# Patient Record
Sex: Female | Born: 1960 | Race: White | Hispanic: No | Marital: Married | Smoking: Never smoker
Health system: Southern US, Community
[De-identification: ages and names within clinical notes are randomized; demographics above are authoritative.]

## PROBLEM LIST (undated history)

## (undated) DIAGNOSIS — J45909 Unspecified asthma, uncomplicated: Secondary | ICD-10-CM

## (undated) DIAGNOSIS — I10 Essential (primary) hypertension: Secondary | ICD-10-CM

## (undated) DIAGNOSIS — R7303 Prediabetes: Secondary | ICD-10-CM

## (undated) DIAGNOSIS — Z974 Presence of external hearing-aid: Secondary | ICD-10-CM

## (undated) DIAGNOSIS — G4730 Sleep apnea, unspecified: Secondary | ICD-10-CM

## (undated) DIAGNOSIS — R569 Unspecified convulsions: Secondary | ICD-10-CM

## (undated) DIAGNOSIS — T7840XA Allergy, unspecified, initial encounter: Secondary | ICD-10-CM

## (undated) DIAGNOSIS — E039 Hypothyroidism, unspecified: Secondary | ICD-10-CM

## (undated) DIAGNOSIS — K219 Gastro-esophageal reflux disease without esophagitis: Secondary | ICD-10-CM

## (undated) DIAGNOSIS — F32A Depression, unspecified: Secondary | ICD-10-CM

## (undated) DIAGNOSIS — M1990 Unspecified osteoarthritis, unspecified site: Secondary | ICD-10-CM

## (undated) DIAGNOSIS — D6851 Activated protein C resistance: Secondary | ICD-10-CM

## (undated) HISTORY — PX: SYNOVIAL CYST EXCISION: SUR507

## (undated) HISTORY — DX: Unspecified asthma, uncomplicated: J45.909

## (undated) HISTORY — DX: Allergy, unspecified, initial encounter: T78.40XA

## (undated) HISTORY — DX: Sleep apnea, unspecified: G47.30

## (undated) HISTORY — PX: TUBAL LIGATION: SHX77

## (undated) HISTORY — PX: TOTAL ABDOMINAL HYSTERECTOMY: SHX209

## (undated) HISTORY — DX: Hypothyroidism, unspecified: E03.9

## (undated) HISTORY — PX: RECTOCELE REPAIR: SHX761

## (undated) HISTORY — PX: SYNDACTLYLY REPAIR: SHX445

## (undated) HISTORY — PX: CYSTOCELE REPAIR: SHX163

## (undated) HISTORY — PX: CHOLECYSTECTOMY: SHX55

## (undated) HISTORY — DX: Essential (primary) hypertension: I10

## (undated) HISTORY — DX: Unspecified convulsions: R56.9

## (undated) HISTORY — PX: JOINT REPLACEMENT: SHX530

## (undated) HISTORY — PX: ABDOMINAL HYSTERECTOMY: SHX81

## (undated) HISTORY — PX: TENDON REPAIR: SHX5111

## (undated) HISTORY — DX: Unspecified osteoarthritis, unspecified site: M19.90

## (undated) HISTORY — PX: SALPINGECTOMY: SHX328

## (undated) HISTORY — PX: ANKLE RECONSTRUCTION: SHX1151

## (undated) HISTORY — DX: Presence of external hearing-aid: Z97.4

---

## 2015-01-07 ENCOUNTER — Other Ambulatory Visit: Payer: Self-pay | Admitting: Orthopedic Surgery

## 2015-01-07 DIAGNOSIS — M25552 Pain in left hip: Secondary | ICD-10-CM

## 2015-01-08 ENCOUNTER — Encounter: Payer: Self-pay | Admitting: Orthopedic Surgery

## 2015-01-08 ENCOUNTER — Ambulatory Visit: Payer: Self-pay | Admitting: Orthopedic Surgery

## 2015-01-08 ENCOUNTER — Other Ambulatory Visit: Payer: Self-pay | Admitting: Orthopedic Surgery

## 2015-01-08 VITALS — BP 198/95 | HR 68 | Ht 66.0 in | Wt 280.0 lb

## 2015-01-08 DIAGNOSIS — M25552 Pain in left hip: Secondary | ICD-10-CM
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
## 2015-01-08 NOTE — Progress Notes (Signed)
Patient seen at [facility], a dictated note will follow...

## 2018-12-09 HISTORY — PX: ESOPHAGOGASTRODUODENOSCOPY: SHX1529

## 2018-12-09 HISTORY — PX: COLONOSCOPY: SHX174

## 2020-02-15 ENCOUNTER — Ambulatory Visit: Payer: Self-pay

## 2020-02-15 ENCOUNTER — Other Ambulatory Visit: Payer: Self-pay

## 2020-02-15 ENCOUNTER — Ambulatory Visit (INDEPENDENT_AMBULATORY_CARE_PROVIDER_SITE_OTHER): Payer: Managed Care, Other (non HMO) | Admitting: Orthopaedic Surgery

## 2020-02-15 ENCOUNTER — Ambulatory Visit (INDEPENDENT_AMBULATORY_CARE_PROVIDER_SITE_OTHER): Payer: Managed Care, Other (non HMO)

## 2020-02-15 ENCOUNTER — Encounter: Payer: Self-pay | Admitting: Orthopaedic Surgery

## 2020-02-15 VITALS — Ht 66.0 in | Wt 280.0 lb

## 2020-02-15 DIAGNOSIS — M25552 Pain in left hip: Secondary | ICD-10-CM | POA: Diagnosis not present

## 2020-02-15 MED ORDER — MELOXICAM 7.5 MG PO TABS: 7.5000 mg | ORAL_TABLET | Freq: Two times a day (BID) | ORAL | 2 refills | Status: DC | PRN

## 2020-02-15 NOTE — Progress Notes (Signed)
Office Visit Note   Patient: Christy Todd           Date of Birth: 07-13-1961           MRN: [id number] Visit Date: 02/15/2020              Requested by: No referring provider defined for this encounter. PCP: Patient, No Pcp Per   Assessment & Plan: Visit Diagnoses:  1. Pain in left hip     Plan: Impression is left hip moderate DJD.  Based on the x-ray findings we discussed that her symptoms are more consistent with DJD than an isolated labral tear.  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  Based on our discussion of treatment options she has agreed to try another left hip injection.  We also discussed about her increased BMI of 45 which would need to be corrected prior to consideration of hip replacement surgery.  I did send in a prescription for meloxicam to see if this will work better than Advil.  I will see her back as needed.  Follow-Up Instructions: Return if symptoms worsen or fail to improve.   Orders:  Orders Placed This Encounter  Procedures  . XR HIP UNILAT W OR W/O PELVIS 2-3 VIEWS LEFT  . US Guided Needle Placement - No Linked Charges   Meds ordered this encounter  Medications  . meloxicam (MOBIC) 7.5 MG tablet    Sig: Take 1 tablet (7.5 mg total) by mouth 2 (two) times daily as needed for pain.    Dispense:  30 tablet    Refill:  2      Procedures: No procedures performed   Clinical Data: No additional findings.   Subjective: Chief Complaint  Patient presents with  . Left Hip - Pain    Christy Todd is a 59 year old female who has moved down here from [place].  Her daughter is a physical therapist at [employer] in [place].  She has had a chronic left hip pain for several years.  This is severely limiting from a functional standpoint and from a pain standpoint.  She denies any radicular symptoms.  She takes Cymbalta and nortriptyline to help with pain management.  She has had 2 prior left hip injections about 3 years ago that did not work  significantly well.  The pain affects her walking and causes right knee and lower back pain.  She was told in [place] at some point that she had a torn hip labrum.   Review of Systems  Constitutional: Negative.   HENT: Negative.   Eyes: Negative.   Respiratory: Negative.   Cardiovascular: Negative.   Endocrine: Negative.   Musculoskeletal: Negative.   Neurological: Negative.   Hematological: Negative.   Psychiatric/Behavioral: Negative.   All other systems reviewed and are negative.    Objective: Vital Signs: Ht 5\' 6"  (1.676 m)   Wt 280 lb (127 kg)   BMI 45.19 kg/m   Physical Exam Vitals and nursing note reviewed.  Constitutional:      Appearance: She is well-developed.  HENT:     Head: Normocephalic and atraumatic.  Pulmonary:     Effort: Pulmonary effort is normal.  Abdominal:     Palpations: Abdomen is soft.  Musculoskeletal:     Cervical back: Neck supple.  Skin:    General: Skin is warm.     Capillary Refill: Capillary refill takes less than 2 seconds.  Neurological:     Mental Status: She is alert and oriented  to person, place, and time.  Psychiatric:        Behavior: Behavior normal.        Thought Content: Thought content normal.        Judgment: Judgment normal.     Ortho Exam Left hip shows pain with internal logroll and FADIR.  Positive Stinchfield sign.  Lateral hip is nontender.  No sciatic tension signs. Specialty Comments:  No specialty comments available.  Imaging: XR HIP UNILAT W OR W/O PELVIS 2-3 VIEWS LEFT  Result Date: 02/15/2020 Moderate left hip DJD with articular spurring.    PMFS History: There are no problems to display for this patient.  History reviewed. No pertinent past medical history.  History reviewed. No pertinent family history.  History reviewed. No pertinent surgical history. Social History   Occupational History  . Not on file  Tobacco Use  . Smoking status: Not on file  Substance and Sexual Activity  .  Alcohol use: Not on file  . Drug use: Not on file  . Sexual activity: Not on file

## 2020-02-15 NOTE — Progress Notes (Signed)
Subjective: Patient is here for ultrasound-guided intra-articular left hip injection.   Groin pain due to DJD.  Objective:  Pain with passive IR.  Procedure: Ultrasound-guided left hip injection: After sterile prep with Betadine, injected 8 cc 1% lidocaine without epinephrine and 40 mg methylprednisolone using a 22-gauge spinal needle, passing the needle through the iliofemoral ligament into the femoral head/neck junction.  Injectate seen filling joint capsule.  Return as directed.

## 2020-02-21 ENCOUNTER — Telehealth: Payer: Self-pay | Admitting: Orthopaedic Surgery

## 2020-02-21 NOTE — Telephone Encounter (Signed)
02/15/2020 ov note faxed to [facility] [phone]

## 2020-04-14 ENCOUNTER — Other Ambulatory Visit: Payer: Self-pay | Admitting: Orthopaedic Surgery

## 2020-05-07 ENCOUNTER — Encounter: Payer: Self-pay | Admitting: Family Medicine

## 2020-05-07 ENCOUNTER — Ambulatory Visit (INDEPENDENT_AMBULATORY_CARE_PROVIDER_SITE_OTHER): Payer: Managed Care, Other (non HMO) | Admitting: Family Medicine

## 2020-05-07 VITALS — BP 145/76 | HR 68 | Temp 98.1°F | Ht 66.14 in | Wt 291.0 lb

## 2020-05-07 DIAGNOSIS — M1651 Unilateral post-traumatic osteoarthritis, right hip: Secondary | ICD-10-CM | POA: Diagnosis not present

## 2020-05-07 DIAGNOSIS — M169 Osteoarthritis of hip, unspecified: Secondary | ICD-10-CM | POA: Insufficient documentation

## 2020-05-07 DIAGNOSIS — E063 Autoimmune thyroiditis: Secondary | ICD-10-CM

## 2020-05-07 DIAGNOSIS — I10 Essential (primary) hypertension: Secondary | ICD-10-CM

## 2020-05-07 DIAGNOSIS — G40909 Epilepsy, unspecified, not intractable, without status epilepticus: Secondary | ICD-10-CM

## 2020-05-07 DIAGNOSIS — E039 Hypothyroidism, unspecified: Secondary | ICD-10-CM | POA: Diagnosis not present

## 2020-05-07 DIAGNOSIS — M25572 Pain in left ankle and joints of left foot: Secondary | ICD-10-CM | POA: Diagnosis not present

## 2020-05-07 DIAGNOSIS — E038 Other specified hypothyroidism: Secondary | ICD-10-CM

## 2020-05-07 DIAGNOSIS — G8929 Other chronic pain: Secondary | ICD-10-CM | POA: Insufficient documentation

## 2020-05-07 DIAGNOSIS — J4530 Mild persistent asthma, uncomplicated: Secondary | ICD-10-CM

## 2020-05-07 MED ORDER — IBUPROFEN 800 MG PO TABS: 800.0000 mg | ORAL_TABLET | Freq: Three times a day (TID) | ORAL | 1 refills | Status: DC | PRN

## 2020-05-07 NOTE — Assessment & Plan Note (Signed)
Well managed with cymbalta and nortriptyline.

## 2020-05-07 NOTE — Assessment & Plan Note (Signed)
Stable symptoms with daily singulair and albuterol as needed.  She will add qvar during peak seasons.
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
## 2020-05-07 NOTE — Assessment & Plan Note (Signed)
Discussed low impact exercises including water aerobics or stationary bike.  Work on calorie restriction as well.  Will replace meloxicam with ibuprofen as this seems to be more effective for her.  She will plan to follow up with orthopedics for ongoing care of this.

## 2020-05-07 NOTE — Assessment & Plan Note (Signed)
She will schedule with neurology for follow up and management of this.

## 2020-05-07 NOTE — Assessment & Plan Note (Signed)
Blood pressure is at goal at for age and co-morbidities.  I recommend she continue current medications.  In addition they were instructed to follow a low sodium diet with regular exercise to help to maintain adequate control of blood pressure.   

## 2020-05-07 NOTE — Patient Instructions (Signed)
Great to meet you today! Lets try ibuprofen 800mg  to replace meloxicam as this seemed to work better for you.  I have sent in a new prescription for this.  Try low impact exercises such as water exercise or biking to help with weight loss in preparation for surgery.  Please let me know if you need any help with getting set up with neurology.  We have faxed the record request. Please let me know when you need medication refills.  I would like to check your thyroid function again around September.   I will plan to see you again in 6 months.

## 2020-05-07 NOTE — Assessment & Plan Note (Signed)
She reports having labs in March which were normal. Continue levothyroxine at current dose and recheck in 2-3 months.

## 2020-05-28 ENCOUNTER — Encounter: Payer: Self-pay | Admitting: Family Medicine

## 2020-05-28 DIAGNOSIS — G4733 Obstructive sleep apnea (adult) (pediatric): Secondary | ICD-10-CM

## 2020-05-28 DIAGNOSIS — G40909 Epilepsy, unspecified, not intractable, without status epilepticus: Secondary | ICD-10-CM

## 2020-05-28 NOTE — Telephone Encounter (Signed)
Neurology referral ordered. Pending review from provider.

## 2020-06-14 ENCOUNTER — Other Ambulatory Visit: Payer: Self-pay | Admitting: Medical-Surgical

## 2020-06-14 ENCOUNTER — Encounter: Payer: Self-pay | Admitting: Family Medicine

## 2020-06-14 MED ORDER — METOPROLOL SUCCINATE ER 50 MG PO TB24: 50.0000 mg | ORAL_TABLET | Freq: Every day | ORAL | 1 refills | Status: DC

## 2020-06-14 NOTE — Telephone Encounter (Signed)
Routing to covering provider. Rx written by historical provider.

## 2020-07-02 ENCOUNTER — Encounter: Payer: Self-pay | Admitting: Neurology

## 2020-07-02 ENCOUNTER — Ambulatory Visit (INDEPENDENT_AMBULATORY_CARE_PROVIDER_SITE_OTHER): Payer: Managed Care, Other (non HMO) | Admitting: Neurology

## 2020-07-02 DIAGNOSIS — Z9989 Dependence on other enabling machines and devices: Secondary | ICD-10-CM

## 2020-07-02 DIAGNOSIS — G4733 Obstructive sleep apnea (adult) (pediatric): Secondary | ICD-10-CM | POA: Diagnosis not present

## 2020-07-02 DIAGNOSIS — G4752 REM sleep behavior disorder: Secondary | ICD-10-CM | POA: Diagnosis not present

## 2020-07-02 DIAGNOSIS — Z87898 Personal history of other specified conditions: Secondary | ICD-10-CM

## 2020-07-02 DIAGNOSIS — E662 Morbid (severe) obesity with alveolar hypoventilation: Secondary | ICD-10-CM

## 2020-07-02 NOTE — Patient Instructions (Signed)
Safe Surgery and Sleep Apnea Sleep apnea is a condition in which breathing pauses or becomes shallow during sleep. Most people with the condition are not aware that they have it. It is important for your health care providers to know whether or not you have sleep apnea, especially if you are having surgery. Sleep apnea can increase your risk of complications during and after surgery. What is sleep apnea screening? Sleep apnea screening is a test to determine if you are at risk for sleep apnea. Before you have surgery, get screened for sleep apnea and talk with your surgeon and primary health care provider about your results. Screening usually involves answering a list of questions about your sleep quality. Ask your health care provider if you can be screened, or take a screening test yourself. You can find these tests online at the American Sleep Apnea Association website. Some questions you may be asked include:  Do you snore?  Is your sleep restless?  Do you have daytime sleepiness?  Has a partner or spouse told you that you stop breathing during sleep?  Have you had trouble concentrating or memory loss? Answer these questions honestly. If a screening test is positive, this means you are at risk for the condition. Further testing may be needed to confirm a diagnosis of sleep apnea. Why does sleep apnea increase the risk for complications? Untreated sleep apnea increases the risk for certain complications during and after surgery. This is because when you have sleep apnea, your airways are more sensitive to medicines used during surgery. The airways can collapse and block the flow of air.  Having untreated sleep apnea can increase your risk for:  A longer stay in the recovery room or hospital.  Breathing difficulties such as low oxygen levels after surgery.  Increased pain after surgery.  Irregular heart rhythms.  Stroke.  Heart attack. You and your health care provider can take steps  to help prevent these and other complications. What should I do if I have sleep apnea?  Before surgery  Tell your health care provider and anesthesia specialist that you have sleep apnea. Discuss your individual risks based on your screening results, the type of surgery you will be having, and other medical conditions that you have.  If you have a sleep apnea device (positive airway pressure device), wear it as prescribed. If you have not been wearing your device, talk with your health care provider about why you have not been wearing it. There are ways to improve your use of the device, such as: ? Adjusting the mask. ? Adding humidified air. ? Getting treatment for nasal congestion.  Do not use any products that contain nicotine or tobacco, such as cigarettes and e-cigarettes. If you need help quitting, ask your health care provider. On the day of surgery  If instructed by your health care provider, bring your sleep apnea device with you.  Wear your sleep apnea device when you are sleeping during your hospital stay, or as told by your health care provider.  Ask your health care provider what special considerations will be taken during and after your surgery. After surgery  You may need to be given extra oxygen and wear a continuous oxygen monitor (pulse oximetry).  For your safety, you may need to stay in the recovery room or hospital for longer than is normal.  Follow instructions from your health care provider about wearing your sleep device: ? Anytime you are sleeping, including during daytime naps. ? While taking   prescription pain medicines, sleeping medicines, or medicines that make you drowsy.  If your health care provider approves, raise the head of your bed or lie on your side. Do not lie flat on your back.  Follow instructions from your health care provider about medicines: ? Avoid using sleep medicines unless they are prescribed by a health care provider who is aware of  the results of your sleep apnea screening. ? Avoid using sleep medicines while taking opioid pain medicine. ? Limit your use of opioid pain medicines as much as possible. Ask your health care provider what is a safe amount to use. ? Ask about using pain medicines that do not affect your breathing, such as NSAIDs or acetaminophen. Where to find more information For more information about sleep apnea screening and healthy sleep, visit these websites:  Centers for Disease Control and Prevention: www.cdc.gov/sleep/index.html  American Sleep Apnea Association: www.sleepapnea.org Contact a health care provider if:  You have sleep apnea or think you may be at risk for sleep apnea, and you are scheduled for surgery. Get help right away if:  You have trouble breathing.  You are very drowsy and cannot stay awake.  You are told that you have pauses in your breathing during sleep after surgery.  You have chest pain.  You have a fast heartbeat. Summary  It is important for your health care providers to know whether or not you have sleep apnea, especially if you are having surgery.  If you have sleep apnea, you are at an increased risk for complications during surgery.  You and your health care provider can take precautions to help prevent complications. If you have sleep apnea, make sure to tell your health care provider and anesthesia specialist. This information is not intended to replace advice given to you by your health care provider. Make sure you discuss any questions you have with your health care provider. Document Revised: 02/10/2019 Document Reviewed: 02/04/2017 Elsevier Patient Education  2020 Elsevier Inc.  
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
## 2020-07-13 ENCOUNTER — Encounter: Payer: Self-pay | Admitting: Family Medicine

## 2020-07-15 ENCOUNTER — Other Ambulatory Visit: Payer: Self-pay

## 2020-07-15 MED ORDER — DULOXETINE HCL 30 MG PO CPEP
ORAL_CAPSULE | ORAL | 2 refills | Status: DC
Start: 2020-07-15 — End: 2020-07-29

## 2020-07-22 ENCOUNTER — Other Ambulatory Visit: Payer: Self-pay

## 2020-07-22 ENCOUNTER — Ambulatory Visit (INDEPENDENT_AMBULATORY_CARE_PROVIDER_SITE_OTHER): Payer: Managed Care, Other (non HMO)

## 2020-07-22 DIAGNOSIS — R569 Unspecified convulsions: Secondary | ICD-10-CM

## 2020-07-22 DIAGNOSIS — E662 Morbid (severe) obesity with alveolar hypoventilation: Secondary | ICD-10-CM

## 2020-07-22 DIAGNOSIS — Z9989 Dependence on other enabling machines and devices: Secondary | ICD-10-CM

## 2020-07-22 DIAGNOSIS — G4733 Obstructive sleep apnea (adult) (pediatric): Secondary | ICD-10-CM

## 2020-07-22 DIAGNOSIS — Z87898 Personal history of other specified conditions: Secondary | ICD-10-CM

## 2020-07-29 ENCOUNTER — Ambulatory Visit (INDEPENDENT_AMBULATORY_CARE_PROVIDER_SITE_OTHER): Payer: Managed Care, Other (non HMO) | Admitting: Neurology

## 2020-07-29 ENCOUNTER — Other Ambulatory Visit: Payer: Self-pay | Admitting: Family Medicine

## 2020-07-29 DIAGNOSIS — G4733 Obstructive sleep apnea (adult) (pediatric): Secondary | ICD-10-CM | POA: Diagnosis not present

## 2020-07-29 DIAGNOSIS — E662 Morbid (severe) obesity with alveolar hypoventilation: Secondary | ICD-10-CM

## 2020-07-29 DIAGNOSIS — Z87898 Personal history of other specified conditions: Secondary | ICD-10-CM

## 2020-07-29 DIAGNOSIS — Z9989 Dependence on other enabling machines and devices: Secondary | ICD-10-CM

## 2020-07-29 MED ORDER — DULOXETINE HCL 30 MG PO CPEP
ORAL_CAPSULE | ORAL | 2 refills | Status: DC
Start: 2020-07-29 — End: 2021-02-19

## 2020-08-01 ENCOUNTER — Telehealth: Payer: Self-pay | Admitting: Neurology

## 2020-08-01 ENCOUNTER — Encounter: Payer: Self-pay | Admitting: Neurology

## 2020-08-01 ENCOUNTER — Other Ambulatory Visit: Payer: Self-pay

## 2020-08-01 ENCOUNTER — Ambulatory Visit (INDEPENDENT_AMBULATORY_CARE_PROVIDER_SITE_OTHER): Payer: Managed Care, Other (non HMO) | Admitting: Neurology

## 2020-08-01 VITALS — BP 138/84 | HR 81 | Ht 66.0 in | Wt 282.0 lb

## 2020-08-01 DIAGNOSIS — G4752 REM sleep behavior disorder: Secondary | ICD-10-CM

## 2020-08-01 DIAGNOSIS — G4733 Obstructive sleep apnea (adult) (pediatric): Secondary | ICD-10-CM

## 2020-08-01 DIAGNOSIS — E662 Morbid (severe) obesity with alveolar hypoventilation: Secondary | ICD-10-CM | POA: Diagnosis not present

## 2020-08-01 DIAGNOSIS — G4730 Sleep apnea, unspecified: Secondary | ICD-10-CM

## 2020-08-01 DIAGNOSIS — G4710 Hypersomnia, unspecified: Secondary | ICD-10-CM | POA: Diagnosis not present

## 2020-08-01 NOTE — Procedures (Signed)
08/01/2020:  EEG -   The patient underwent a 33 minutes recording on 07-22-20.  Her EEG was performed in the International 10-20 placement of electrodes.  The patient was alert and cooperative.  A 8 Hz posterior dominant rhythm was noted on the lateral moderate amplitude and promptly attenuated with eye opening.  The patient's EEG showed signs of drowsiness while she was attempting hyperventilation.  The amplitude of the EEG increased, and there was a single 3 sharp wave discharge between T3 and T5 noted which had no corresponding focus on the right brain.  However this was an isolated finding but did not repeat itself.  The patient became more drowsy after hyperventilation was concluded and actually reached sleep stages 1 and 2 briefly bifrontal slowing was noted photic stimulation was then implemented.  Bifrontal slowing was still noted but there was entrainment without any epileptiform activity at various frequencies of photic stimulation up to 20 Hz.  Following the conclusion of photic stimulation maneuver the patient actually entered deeper sleep.  Sleep activity appeared symmetric and reached NREM sleep stage II.   This EEG would be considered mildly dysrhythmic. The findings at T3/T5 would not correlate to absence seizures. The sleep witnessed at the end of the recording documented sleep apnea.   Larey Seat, MD

## 2020-08-01 NOTE — Patient Instructions (Signed)
Sleep Apnea Sleep apnea is a condition in which breathing pauses or becomes shallow during sleep. Episodes of sleep apnea usually last 10 seconds or longer, and they may occur as many as 20 times an hour. Sleep apnea disrupts your sleep and keeps your body from getting the rest that it needs. This condition can increase your risk of certain health problems, including:  Heart attack.  Stroke.  Obesity.  Diabetes.  Heart failure.  Irregular heartbeat. What are the causes? There are three kinds of sleep apnea:  Obstructive sleep apnea. This kind is caused by a blocked or collapsed airway.  Central sleep apnea. This kind happens when the part of the brain that controls breathing does not send the correct signals to the muscles that control breathing.  Mixed sleep apnea. This is a combination of obstructive and central sleep apnea. The most common cause of this condition is a collapsed or blocked airway. An airway can collapse or become blocked if:  Your throat muscles are abnormally relaxed.  Your tongue and tonsils are larger than normal.  You are overweight.  Your airway is smaller than normal. What increases the risk? You are more likely to develop this condition if you:  Are overweight.  Smoke.  Have a smaller than normal airway.  Are elderly.  Are female.  Drink alcohol.  Take sedatives or tranquilizers.  Have a family history of sleep apnea. What are the signs or symptoms? Symptoms of this condition include:  Trouble staying asleep.  Daytime sleepiness and tiredness.  Irritability.  Loud snoring.  Morning headaches.  Trouble concentrating.  Forgetfulness.  Decreased interest in sex.  Unexplained sleepiness.  Mood swings.  Personality changes.  Feelings of depression.  Waking up often during the night to urinate.  Dry mouth.  Sore throat. How is this diagnosed? This condition may be diagnosed with:  A medical history.  A physical  exam.  A series of tests that are done while you are sleeping (sleep study). These tests are usually done in a sleep lab, but they may also be done at home. How is this treated? Treatment for this condition aims to restore normal breathing and to ease symptoms during sleep. It may involve managing health issues that can affect breathing, such as high blood pressure or obesity. Treatment may include:  Sleeping on your side.  Using a decongestant if you have nasal congestion.  Avoiding the use of depressants, including alcohol, sedatives, and narcotics.  Losing weight if you are overweight.  Making changes to your diet.  Quitting smoking.  Using a device to open your airway while you sleep, such as: ? An oral appliance. This is a custom-made mouthpiece that shifts your lower jaw forward. ? A continuous positive airway pressure (CPAP) device. This device blows air through a mask when you breathe out (exhale). ? A nasal expiratory positive airway pressure (EPAP) device. This device has valves that you put into each nostril. ? A bi-level positive airway pressure (BPAP) device. This device blows air through a mask when you breathe in (inhale) and breathe out (exhale).  Having surgery if other treatments do not work. During surgery, excess tissue is removed to create a wider airway. It is important to get treatment for sleep apnea. Without treatment, this condition can lead to:  High blood pressure.  Coronary artery disease.  In men, an inability to achieve or maintain an erection (impotence).  Reduced thinking abilities. Follow these instructions at home: Lifestyle  Make any lifestyle changes   that your health care provider recommends.  Eat a healthy, well-balanced diet.  Take steps to lose weight if you are overweight.  Avoid using depressants, including alcohol, sedatives, and narcotics.  Do not use any products that contain nicotine or tobacco, such as cigarettes,  e-cigarettes, and chewing tobacco. If you need help quitting, ask your health care provider. General instructions  Take over-the-counter and prescription medicines only as told by your health care provider.  If you were given a device to open your airway while you sleep, use it only as told by your health care provider.  If you are having surgery, make sure to tell your health care provider you have sleep apnea. You may need to bring your device with you.  Keep all follow-up visits as told by your health care provider. This is important. Contact a health care provider if:  The device that you received to open your airway during sleep is uncomfortable or does not seem to be working.  Your symptoms do not improve.  Your symptoms get worse. Get help right away if:  You develop: ? Chest pain. ? Shortness of breath. ? Discomfort in your back, arms, or stomach.  You have: ? Trouble speaking. ? Weakness on one side of your body. ? Drooping in your face. These symptoms may represent a serious problem that is an emergency. Do not wait to see if the symptoms will go away. Get medical help right away. Call your local emergency services (911 in the [place] Do not drive yourself to the hospital. Summary  Sleep apnea is a condition in which breathing pauses or becomes shallow during sleep.  The most common cause is a collapsed or blocked airway.  The goal of treatment is to restore normal breathing and to ease symptoms during sleep. This information is not intended to replace advice given to you by your health care provider. Make sure you discuss any questions you have with your health care provider. Document Revised: 04/05/2019 Document Reviewed: 06/14/2018 Elsevier Patient Education  2020 Elsevier Inc.  
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
## 2020-08-01 NOTE — Telephone Encounter (Signed)
[employer] order sent to GI. They will obtain the auth and reach out to the patient to schedule.  

## 2020-08-05 ENCOUNTER — Ambulatory Visit
Admission: RE | Admit: 2020-08-05 | Discharge: 2020-08-05 | Disposition: A | Discharge status: Home / Self Care | Payer: Managed Care, Other (non HMO) | Source: Ambulatory Visit | Attending: Neurology | Admitting: Neurology

## 2020-08-05 ENCOUNTER — Other Ambulatory Visit: Payer: Self-pay

## 2020-08-05 DIAGNOSIS — G4710 Hypersomnia, unspecified: Secondary | ICD-10-CM

## 2020-08-05 DIAGNOSIS — G4752 REM sleep behavior disorder: Secondary | ICD-10-CM

## 2020-08-05 DIAGNOSIS — E662 Morbid (severe) obesity with alveolar hypoventilation: Secondary | ICD-10-CM

## 2020-08-05 DIAGNOSIS — G4733 Obstructive sleep apnea (adult) (pediatric): Secondary | ICD-10-CM

## 2020-08-05 MED ORDER — GADOBENATE DIMEGLUMINE 529 MG/ML IV SOLN
20.0000 mL | Freq: Once | INTRAVENOUS | Status: AC | PRN
  Administered 2020-08-05: 20 mL via INTRAVENOUS

## 2020-08-05 NOTE — Procedures (Signed)
Sleep Study Report   Patient Information     First Name: Christy Last Name: Todd ID: [id number]  Birth Date: Sep 14, 2061 Age: 59 Gender: Female  Referring Prvodier: Luetta Nutting, DO BMI: 46.8 (W=291 lb, H=5' 6'')  Neck Circ.:  16 '' Epworth:  14/24   Sleep Study Information    Study Date: 07/29/20 S/H/A Version: [id number] / 4.2.1023 / 79  History:    Christy Todd is a 42-Year-old Caucasian female patient and was seen for  consultation upon request by PCP on 07/02/2020. Chief concern according to patient:   Christy Todd was residing in the state of [place], in [place], when she had her previous sleep study, and she has been a compliant CPAP user ever since her diagnosis.  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  She has never used any ozone emitting device to clean the machine and I think that she is definitely safe to use her Respironics machine until it can be replaced.  There is no urgency to replace it." Christy Todd is a Caucasian female who has a past medical history of Asthma, Hypertension, Hypothyroid, and wears hearing aid in right ear. OSA on CPAP.    Summary & Diagnosis:    Severe sleep apnea is confirmed in the HST , which did not allow for differentiation into REM and NREM apnea. Severe and frequent desaturations accumulated to a total desaturation time of over 180 minutes and constitute hypoxemia of sleep.    Recommendations:     The patient will receive a new Auto-pap as soon as available. I specified the settings in her order. Mask of her choice and heated humidification needed as well.   Interpreting Physician: Mack Hook            Sleep Summary  Oxygen Saturation Statistics   Start Study Time: End Study Time: Total Recording Time:          11:20:50 PM 8:15:20 AM   8 h, 54 min  Total Sleep Time % REM of Sleep Time:  7 h, 22 min  3.8    Mean: 92 Minimum: 84 Maximum: 97  Mean of Desaturations Nadirs (%):    90  Oxygen Desaturation. %:  4-9 10-20 >20 Total  Events Number Total   189  1 99.5 0.5  0 0.0  190 100.0  Oxygen Saturation: <90 <=88 <85 <80 <70  Duration (minutes): Sleep % 11.9 2.7 3.3 0.0 0.8 0.0 0.0 0.0 0.0 0.0     Respiratory Indices      Total Events REM NREM All Night  pRDI: pAHI 3%: ODI 4%:  pAHIc 3%: % CSR: pAHI 4%:  258  257 190  92 0.0 191 N/A N/A N/A N/A N/A N/A N/A N/A 72.7 72.5 53.6 25.9 53.9       Pulse Rate Statistics during Sleep (BPM)      Mean: 75 Minimum: 55 Maximum: 100    Indices are calculated using technically valid sleep time of 3 h, 32 min.                                                                             pAHI=72.5  Mild              Moderate                    Severe                                                 5              15                    30   Body Position Statistics  Position Supine Prone Right Left Non-Supine  Sleep (min) 310.0 129.0 0.0 3.0 132.0  Sleep % 70.1 29.2 0.0 0.7 29.9  pRDI 70.0 77.1 N/A N/A 76.8  pAHI 3% 70.0 76.4 N/A N/A 76.1  ODI 4% 54.7 51.6 N/A N/A 51.9            Left   Prone  Supine    Snoring Statistics Snoring Level (dB) >40 >50 >60 >70 >80 >Threshold (45)  Sleep (min) 251.9 30.1 3.9 0.0 0.0 57.6  Sleep % 57.0 6.8 0.9 0.0 0.0 13.0    Mean: 42 dB

## 2020-08-05 NOTE — Progress Notes (Signed)
This MRI of the brain with and without contrast shows the following: 1.  Brain parenchyma appears normal. 2.  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   This could be an incidental finding though could also be seen with elevated intracranial pressures 3.   There are no acute findings and there is a normal enhancement pattern. Richard A. Felecia Shelling, MD, PhD, Charlynn Grimes

## 2020-08-05 NOTE — Progress Notes (Signed)
Summary & Diagnosis:   Severe sleep apnea is confirmed in the HST , which did not allow  for differentiation into REM and NREM apnea. Severe and frequent  desaturations accumulated to a total desaturation time of over  180 minutes and constitute hypoxemia of sleep.    Recommendations:    The patient will receive a new Auto-pap as soon as available. I  specified the settings in her order. Mask of her choice and  heated humidification needed as well.   Interpreting Physician: Asencion Partridge Raeli Wiens,MD

## 2020-08-05 NOTE — Telephone Encounter (Signed)
Novella Rob: [id number] 9exp. 08/02/20 to 10/31/20)

## 2020-08-07 ENCOUNTER — Encounter: Payer: Self-pay | Admitting: Neurology

## 2020-08-19 ENCOUNTER — Other Ambulatory Visit: Payer: Self-pay

## 2020-08-19 ENCOUNTER — Encounter: Payer: Self-pay | Admitting: Family Medicine

## 2020-08-19 MED ORDER — SPIRONOLACTONE 50 MG PO TABS: 50.0000 mg | ORAL_TABLET | Freq: Every day | ORAL | 0 refills | Status: DC

## 2020-08-19 NOTE — Progress Notes (Signed)
Pt sent message requesting refill

## 2020-08-27 ENCOUNTER — Encounter: Payer: Self-pay | Admitting: Family Medicine

## 2020-08-27 ENCOUNTER — Other Ambulatory Visit: Payer: Self-pay

## 2020-08-27 MED ORDER — LEVOTHYROXINE SODIUM 137 MCG PO TABS: 137.0000 ug | ORAL_TABLET | Freq: Every morning | ORAL | 0 refills | Status: DC

## 2020-08-27 NOTE — Telephone Encounter (Signed)
Pt has been advised of lab orders for TSH. Agrees to have labs completed this week.   Sent Rx x 30 days to pharmacy.

## 2020-08-27 NOTE — Telephone Encounter (Signed)
Needs to have thyroid labs completed. Please fill x30 days and have her stop in to have labs completed.

## 2020-08-28 LAB — TSH+FREE T4: TSH W/REFLEX TO FT4: 2.45 mIU/L (ref 0.40–4.50)

## 2020-08-30 ENCOUNTER — Other Ambulatory Visit: Payer: Self-pay | Admitting: Family Medicine

## 2020-08-30 MED ORDER — LEVOTHYROXINE SODIUM 137 MCG PO TABS
137.0000 ug | ORAL_TABLET | Freq: Every morning | ORAL | 1 refills | Status: DC
Start: 2020-08-30 — End: 2020-12-20

## 2020-09-05 ENCOUNTER — Other Ambulatory Visit: Payer: Self-pay | Admitting: Family Medicine

## 2020-10-17 ENCOUNTER — Other Ambulatory Visit: Payer: Self-pay | Admitting: Family Medicine

## 2020-10-17 MED ORDER — NORTRIPTYLINE HCL 25 MG PO CAPS
25.0000 mg | ORAL_CAPSULE | Freq: Every evening | ORAL | 3 refills | Status: DC | PRN
Start: 2020-10-17 — End: 2021-10-03

## 2020-11-05 ENCOUNTER — Ambulatory Visit (INDEPENDENT_AMBULATORY_CARE_PROVIDER_SITE_OTHER): Payer: Managed Care, Other (non HMO) | Admitting: Adult Health

## 2020-11-05 ENCOUNTER — Encounter: Payer: Self-pay | Admitting: Adult Health

## 2020-11-05 VITALS — BP 143/84 | HR 75 | Ht 66.0 in | Wt 282.0 lb

## 2020-11-05 DIAGNOSIS — Z9989 Dependence on other enabling machines and devices: Secondary | ICD-10-CM | POA: Diagnosis not present

## 2020-11-05 DIAGNOSIS — R519 Headache, unspecified: Secondary | ICD-10-CM

## 2020-11-05 DIAGNOSIS — G4733 Obstructive sleep apnea (adult) (pediatric): Secondary | ICD-10-CM

## 2020-11-05 NOTE — Progress Notes (Signed)
PATIENT: Christy Todd DOB: January 14, 1961  REASON FOR VISIT: follow up HISTORY FROM: patient  HISTORY OF PRESENT ILLNESS: Today 11/05/20:  Christy Todd is a 60 year old female with a history of seizures and obstructive sleep apnea on CPAP.  The patient has not had any seizure events.  She is not on any antiseizure medication.  Her CPAP download indicates that she use her machine nightly for compliance of 100%.  She is averaging greater than 4 hours each night.  On average she uses her machine 7 hours and 15 minutes.  Her residual AHI is 1.6 on 10 to 17 cm of water with EPR 2.  Leak in the 95th percentile is 3.5 L/min she reports that the CPAP is working well although she is still waking up with headaches.  She is currently on nortriptyline for hip pain prescribed by Dr. Zigmund Daniel.  HISTORY Christy Todd a 61-  year- old  Caucasian female patientwho had been seen in a sleep consultation on 07-02-2020 and now returns to address her seizures disorder. 08-01-2020:  Christy Todd's home sleep test dated 07-29-20 revealed an overnight AHI of 72.5 (apneas and hypopneas per hour) of sleep that is a very severe degree of apnea.  Unfortunately she lost the chest wall electrode or contact to it and so I do not find myself unable to tell her which position she slept there is no differentiation between non-REM and REM sleep but clearly with this degree of sleep apnea she would be dependent on positive airway pressure. She used to use BiPAP and now  has used for many years CPAP, yes and if her machine needs to be replaced it will need to be replaced 9 with.  She feels her current pressure CPAP of 12 cm water is not high enough. s for the same reason the patient is not fond of the RAMP feature.  I will order a nasal cradle for her -  Auto CPAP 10-17 cm water, 2 cm EPR. Heated humidification and heated tubing.   2)  The patient had reported to Dr. Zigmund Daniel her new primary care physician that she has a history of  generalized seizures was seeing a neurologist in [place], follow-up EEGs were negative.  She has not been on any antiseizure medications her daughter had mentioned to her that she sometimes seems to stare off or be absent, so there has been a question if this could be a form of seizure activity she was also told by a pharmacist that 2 of the medications she is currently using may not going well together in terms of seizure threshold lowering effect:  Since 2016 on Cymbalta (2 in Am one in PM )  and Nortriptyline ( at bedtime) - this may lead to high BP peaks.    REVIEW OF SYSTEMS: Out of a complete 14 system review of symptoms, the patient complains only of the following symptoms, and all other reviewed systems are negative.  FSS 44 ESS 12  ALLERGIES: Allergies  Allergen Reactions  . Gluten Meal Other (See Comments)    Gut Distress  . Ciprofloxacin   . Claritin [Loratadine]   . Septra [Sulfamethoxazole-Trimethoprim]   . Eggs-Apples-Oats [Alimentum] Other (See Comments)    Chemical burn to mouth and gums    HOME MEDICATIONS: Outpatient Medications Prior to Visit  Medication Sig Dispense Refill  . ALBUTEROL SULFATE IN Inhale into the lungs.    . beclomethasone (QVAR) 80 MCG/ACT inhaler Inhale 2 puffs into the lungs 2 (two) times  daily.    . Cholecalciferol (VITAMIN D3) 50 MCG (2000 UT) TABS Take by mouth.    . DULoxetine (CYMBALTA) 30 MG capsule 2 BY MOUTH EVERY MORNING, 1 BY MOUTH EVERY AT NIGHT 270 capsule 2  . ibuprofen (ADVIL) 800 MG tablet TAKE 1 TABLET BY MOUTH EVERY 8 HOURS AS NEEDED 60 tablet 1  . levothyroxine (SYNTHROID) 137 MCG tablet Take 1 tablet (137 mcg total) by mouth every morning. 90 tablet 1  . lisinopril (ZESTRIL) 40 MG tablet Take 40 mg by mouth daily.    . metoprolol succinate (TOPROL-XL) 50 MG 24 hr tablet Take 1 tablet (50 mg total) by mouth daily. 90 tablet 1  . montelukast (SINGULAIR) 10 MG tablet Take 10 mg by mouth daily.    . nortriptyline (PAMELOR) 25 MG  capsule Take 1 capsule (25 mg total) by mouth at bedtime as needed. 90 capsule 3  . omeprazole (PRILOSEC) 20 MG capsule Take 20 mg by mouth daily.    Marland Kitchen spironolactone (ALDACTONE) 50 MG tablet Take 1 tablet (50 mg total) by mouth daily. 90 tablet 0   No facility-administered medications prior to visit.    PAST MEDICAL HISTORY: Past Medical History:  Diagnosis Date  . Asthma   . Hypertension   . Hypothyroid   . Wears hearing aid in right ear     PAST SURGICAL HISTORY: Past Surgical History:  Procedure Laterality Date  . ABDOMINAL HYSTERECTOMY    . ANKLE RECONSTRUCTION    . CHOLECYSTECTOMY    . CYSTOCELE REPAIR    . RECTOCELE REPAIR    . TENDON REPAIR      FAMILY HISTORY: Family History  Problem Relation Age of Onset  . Hypertension Father   . Diabetes Maternal Grandmother     SOCIAL HISTORY: Social History   Socioeconomic History  . Marital status: Married    Spouse name: Shateara Tippie  . Number of children: Not on file  . Years of education: Not on file  . Highest education level: Not on file  Occupational History  . Not on file  Tobacco Use  . Smoking status: Never Smoker  . Smokeless tobacco: Never Used  Vaping Use  . Vaping Use: Never used  Substance and Sexual Activity  . Alcohol use: Yes    Alcohol/week: 1.0 standard drink    Types: 1 Standard drinks or equivalent per week    Comment: Rarely  . Drug use: Never  . Sexual activity: Yes    Partners: Male  Other Topics Concern  . Not on file  Social History Narrative  . Not on file   Social Determinants of Health   Financial Resource Strain: Not on file  Food Insecurity: Not on file  Transportation Needs: Not on file  Physical Activity: Not on file  Stress: Not on file  Social Connections: Not on file  Intimate Partner Violence: Not on file      PHYSICAL EXAM  Vitals:   11/05/20 1507  BP: (!) 143/84  Pulse: 75  Weight: 282 lb (127.9 kg)  Height: 5\' 6"  (1.676 m)   Body mass index  is 45.52 kg/m.  Generalized: Well developed, in no acute distress  Chest: Lungs clear to auscultation bilaterally  Neurological examination  Mentation: Alert oriented to time, place, history taking. Follows all commands speech and language fluent Cranial nerve II-XII: Extraocular movements were full, visual field were full on confrontational test Head turning and shoulder shrug  were normal and symmetric. Motor: The motor testing reveals  5 over 5 strength of all 4 extremities. Good symmetric motor tone is noted throughout.  Sensory: Sensory testing is intact to soft touch on all 4 extremities. No evidence of extinction is noted.  Gait and station: Gait is normal.    DIAGNOSTIC DATA (LABS, IMAGING, TESTING) - I reviewed patient records, labs, notes, testing and imaging myself where available.  No results found for: WBC, HGB, HCT, MCV, PLT No results found for: NA, K, CL, CO2, GLUCOSE, BUN, CREATININE, CALCIUM, PROT, ALBUMIN, AST, ALT, ALKPHOS, BILITOT, GFRNONAA, GFRAA No results found for: CHOL, HDL, LDLCALC, LDLDIRECT, TRIG, CHOLHDL No results found for: SLHT3S No results found for: VITAMINB12 No results found for: TSH    ASSESSMENT AND PLAN 60 y.o. year old female  has a past medical history of Asthma, Hypertension, Hypothyroid, and Wears hearing aid in right ear. here with:  1. OSA on CPAP  - CPAP compliance excellent - Good treatment of AHI  - Encourage patient to use CPAP nightly and > 4 hours each night  2.  Daily headaches  -We will do ONO on CPAP to evaluate oxygen levels--if she has continued to have low oxygen levels while on CPAP this may be contributing to her daily headaches --Patient also takes ibuprofen on a daily basis not because of her headaches but due to other pain.  Cautioned the patient that daily use of ibuprofen could be contributing to rebound headaches  She will follow-up in 6 months or sooner if needed   I spent 30 minutes of face-to-face and  non-face-to-face time with patient.  This included previsit chart review, lab review, study review, order entry, electronic health record documentation, patient education.  Butch Penny, MSN, NP-C 11/05/2020, 3:20 PM [facility] [address], [address] [place], [place] [address] [phone]

## 2020-11-05 NOTE — Patient Instructions (Signed)
Your Plan:  Continue using CPAP Will call about additional testing     Thank you for coming to see [place] at [facility]. I hope we have been able to provide you high quality care today.  You may receive a patient satisfaction survey over the next few weeks. We would appreciate your feedback and comments so that we may continue to improve ourselves and the health of our patients.

## 2020-11-07 NOTE — Progress Notes (Addendum)
LMVM for [employer]/ [employer] for them re: complete overnight pulse ox while on cpap. Will also send CM. Done.Kathee Delton, RN Got it, will forward to the [facility] office for processing.

## 2020-11-10 ENCOUNTER — Other Ambulatory Visit: Payer: Self-pay | Admitting: Family Medicine

## 2020-11-14 ENCOUNTER — Encounter: Payer: Self-pay | Admitting: Family Medicine

## 2020-11-14 ENCOUNTER — Other Ambulatory Visit: Payer: Self-pay

## 2020-11-14 ENCOUNTER — Ambulatory Visit (INDEPENDENT_AMBULATORY_CARE_PROVIDER_SITE_OTHER): Payer: Managed Care, Other (non HMO) | Admitting: Family Medicine

## 2020-11-14 ENCOUNTER — Other Ambulatory Visit: Payer: Self-pay | Admitting: Family Medicine

## 2020-11-14 VITALS — BP 146/79 | HR 82 | Ht 66.0 in | Wt 286.0 lb

## 2020-11-14 DIAGNOSIS — E038 Other specified hypothyroidism: Secondary | ICD-10-CM | POA: Diagnosis not present

## 2020-11-14 DIAGNOSIS — G4733 Obstructive sleep apnea (adult) (pediatric): Secondary | ICD-10-CM

## 2020-11-14 DIAGNOSIS — M1651 Unilateral post-traumatic osteoarthritis, right hip: Secondary | ICD-10-CM

## 2020-11-14 DIAGNOSIS — I10 Essential (primary) hypertension: Secondary | ICD-10-CM

## 2020-11-14 DIAGNOSIS — J4530 Mild persistent asthma, uncomplicated: Secondary | ICD-10-CM | POA: Diagnosis not present

## 2020-11-14 DIAGNOSIS — E063 Autoimmune thyroiditis: Secondary | ICD-10-CM

## 2020-11-14 MED ORDER — SEMAGLUTIDE-WEIGHT MANAGEMENT 1 MG/0.5ML ~~LOC~~ SOAJ: 1.0000 mg | SUBCUTANEOUS | 0 refills | Status: DC

## 2020-11-14 MED ORDER — SPIRONOLACTONE 50 MG PO TABS: 50.0000 mg | ORAL_TABLET | Freq: Every day | ORAL | 1 refills | Status: DC

## 2020-11-14 MED ORDER — SEMAGLUTIDE-WEIGHT MANAGEMENT 0.5 MG/0.5ML ~~LOC~~ SOAJ: 0.5000 mg | SUBCUTANEOUS | 0 refills | Status: DC

## 2020-11-14 MED ORDER — SEMAGLUTIDE-WEIGHT MANAGEMENT 0.25 MG/0.5ML ~~LOC~~ SOAJ: 0.2500 mg | SUBCUTANEOUS | 0 refills | Status: DC

## 2020-11-14 MED ORDER — MONTELUKAST SODIUM 10 MG PO TABS
10.0000 mg | ORAL_TABLET | Freq: Every day | ORAL | 1 refills | Status: DC
Start: 2020-11-14 — End: 2021-05-22

## 2020-11-14 NOTE — Assessment & Plan Note (Signed)
We discussed medications for weight loss.  I don't think stimulant type medications would be a good choice due to her HTN.  Bupropion containing medications lower seizure threshold.  We discussed GLP-1 and we'll see if we can obtain coverage of Wegovy or Saxenda.  [facility] sent in.  Side effects reviewed with her.

## 2020-11-14 NOTE — Patient Instructions (Signed)
We'll see if we can get [facility] approved for weight loss. We may be able to get Saxenda if Christy Todd is not approved.   Please follow up with me in 3 months for weight/medication check if you are able to start on these.

## 2020-11-14 NOTE — Assessment & Plan Note (Signed)
Stable with combination of daily qvar and singulair with albuterol as needed.

## 2020-11-14 NOTE — Assessment & Plan Note (Signed)
Limiting her exercise.  Told she needs replacement but BMI remains too high.

## 2020-11-14 NOTE — Assessment & Plan Note (Signed)
Doing well with current dose of levothyroxine.  TSH in 10/21 was normal, continue at current strength.

## 2020-11-14 NOTE — Assessment & Plan Note (Signed)
Managed by neurology/sleep medicine

## 2020-11-14 NOTE — Progress Notes (Signed)
Christy Todd - 60 y.o. female MRN [id number]  Date of birth: 07-23-1961  Subjective No chief complaint on file.   HPI Christy Todd is a 60 y.o. female with history of HTN, hypothyroidism, OSA, seizure d/o and asthma here today for 6 month follow up .   She reports that overall she is doing well.  She continues to have pain in her hip.  Has been told she needs replacement but would need to lose weight prior to having this done.  She continues to struggle with weight loss.  She is unable to exercise regularly due to pain.  She would be interested to discuss weight loss medication.   HTN is currently manged with lisinopril, aldactone and metoprolol.  She is taking these as directed. BP at home has been good.  She denies chest pain, shortness of breath, palpitations, headache or vision changes.   She feels good at current dose of levothyroxine.  TSH in October was normal.    Asthma remains well controlled with singulair and qvar daily.  She has not needed albuterol very often until recently when she ran out of singulair.   She is seeing neuro for management of OSA and history of seizure d/o  ROS:  A comprehensive ROS was completed and negative except as noted per HPI  Allergies  Allergen Reactions  . Gluten Meal Other (See Comments)    Gut Distress  . Ciprofloxacin   . Claritin [Loratadine]   . Septra [Sulfamethoxazole-Trimethoprim]   . Eggs-Apples-Oats [Alimentum] Other (See Comments)    Chemical burn to mouth and gums    Past Medical History:  Diagnosis Date  . Asthma   . Hypertension   . Hypothyroid   . Wears hearing aid in right ear     Past Surgical History:  Procedure Laterality Date  . ABDOMINAL HYSTERECTOMY    . ANKLE RECONSTRUCTION    . CHOLECYSTECTOMY    . CYSTOCELE REPAIR    . RECTOCELE REPAIR    . TENDON REPAIR      Social History   Socioeconomic History  . Marital status: Married    Spouse name: Waneta Fitting  . Number of children: Not on file  .  Years of education: Not on file  . Highest education level: Not on file  Occupational History  . Not on file  Tobacco Use  . Smoking status: Never Smoker  . Smokeless tobacco: Never Used  Vaping Use  . Vaping Use: Never used  Substance and Sexual Activity  . Alcohol use: Yes    Alcohol/week: 1.0 standard drink    Types: 1 Standard drinks or equivalent per week    Comment: Rarely  . Drug use: Never  . Sexual activity: Yes    Partners: Male  Other Topics Concern  . Not on file  Social History Narrative  . Not on file   Social Determinants of Health   Financial Resource Strain: Not on file  Food Insecurity: Not on file  Transportation Needs: Not on file  Physical Activity: Not on file  Stress: Not on file  Social Connections: Not on file    Family History  Problem Relation Age of Onset  . Hypertension Father   . Diabetes Maternal Grandmother     Health Maintenance  Topic Date Due  . Hepatitis C Screening  Never done  . HIV Screening  Never done  . PAP SMEAR-Modifier  Never done  . COLONOSCOPY (Pts 45-79yrs Insurance coverage will need to be confirmed)  Never  done  . MAMMOGRAM  Never done  . TETANUS/TDAP  08/27/2030  . INFLUENZA VACCINE  Completed  . COVID-19 Vaccine  Completed     ----------------------------------------------------------------------------------------------------------------------------------------------------------------------------------------------------------------- Physical Exam BP (!) 146/79   Pulse 82   Ht 5\' 6"  (1.676 m)   Wt 286 lb (129.7 kg)   LMP 11/02/1993   BMI 46.16 kg/m   Physical Exam Constitutional:      Appearance: Normal appearance.  HENT:     Head: Normocephalic and atraumatic.  Eyes:     General: No scleral icterus. Cardiovascular:     Rate and Rhythm: Normal rate and regular rhythm.  Pulmonary:     Effort: Pulmonary effort is normal.     Breath sounds: Normal breath sounds.  Neurological:     General: No  focal deficit present.  Psychiatric:        Mood and Affect: Mood normal.        Behavior: Behavior normal.     ------------------------------------------------------------------------------------------------------------------------------------------------------------------------------------------------------------------- Assessment and Plan  Essential hypertension BP mildly elevated today, better readings at home. Will continue current medications and she will work on weight loss.   Severe obstructive sleep apnea-hypopnea syndrome Managed by neurology/sleep medicine  Mild persistent asthma Stable with combination of daily qvar and singulair with albuterol as needed.   Hypothyroidism Doing well with current dose of levothyroxine.  TSH in 10/21 was normal, continue at current strength.   Degenerative joint disease (DJD) of hip Limiting her exercise.  Told she needs replacement but BMI remains too high.   Morbid obesity ([place]) We discussed medications for weight loss.  I don't think stimulant type medications would be a good choice due to her HTN.  Bupropion containing medications lower seizure threshold.  We discussed GLP-1 and we'll see if we can obtain coverage of Wegovy or Saxenda.  [facility] sent in.  Side effects reviewed with her.     Meds ordered this encounter  Medications  . spironolactone (ALDACTONE) 50 MG tablet    Sig: Take 1 tablet (50 mg total) by mouth daily.    Dispense:  90 tablet    Refill:  1  . montelukast (SINGULAIR) 10 MG tablet    Sig: Take 1 tablet (10 mg total) by mouth daily.    Dispense:  90 tablet    Refill:  1  . Semaglutide-Weight Management 0.25 MG/0.5ML SOAJ    Sig: Inject 0.25 mg into the skin once a week for 28 days.    Dispense:  2 mL    Refill:  0  . Semaglutide-Weight Management 0.5 MG/0.5ML SOAJ    Sig: Inject 0.5 mg into the skin once a week for 28 days.    Dispense:  2 mL    Refill:  0  . Semaglutide-Weight Management 1 MG/0.5ML SOAJ     Sig: Inject 1 mg into the skin once a week for 28 days.    Dispense:  2 mL    Refill:  0    Return in about 3 months (around 02/12/2021) for weight managment. .    This visit occurred during the SARS-CoV-2 public health emergency.  Safety protocols were in place, including screening questions prior to the visit, additional usage of staff PPE, and extensive cleaning of exam room while observing appropriate contact time as indicated for disinfecting solutions.

## 2020-11-14 NOTE — Assessment & Plan Note (Signed)
BP mildly elevated today, better readings at home. Will continue current medications and she will work on weight loss.

## 2020-11-18 ENCOUNTER — Telehealth: Payer: Self-pay

## 2020-11-18 NOTE — Telephone Encounter (Signed)
Prior authorization for Wegovy 0.25mg /0.5 ml submitted to pt's insurance, pending determination.

## 2020-11-20 NOTE — Telephone Encounter (Signed)
Prior authorization for [employer] approved by insurance. As long as the pt remain covered by their prescription drug plan and there are no changes to their plan benefits, this request is approved for the following time period 11/02/20 through 06/18/21. [facility] has been updated.

## 2020-11-25 ENCOUNTER — Telehealth: Payer: Self-pay | Admitting: Neurology

## 2020-11-25 ENCOUNTER — Encounter: Payer: Self-pay | Admitting: Adult Health

## 2020-11-25 NOTE — Telephone Encounter (Signed)
ONO did not show significant desaturations.  Oxygen not needed

## 2020-11-25 NOTE — Telephone Encounter (Signed)
Received the patients overnight oximetry study on CPAP ordered by Hedwig Morton, NP. I will forward the results to [facility] to be reviewed and resulted.

## 2020-12-16 ENCOUNTER — Encounter: Payer: Self-pay | Admitting: Family Medicine

## 2020-12-16 ENCOUNTER — Other Ambulatory Visit: Payer: Self-pay

## 2020-12-16 DIAGNOSIS — I10 Essential (primary) hypertension: Secondary | ICD-10-CM

## 2020-12-16 MED ORDER — LISINOPRIL 40 MG PO TABS: 40.0000 mg | ORAL_TABLET | Freq: Every day | ORAL | 3 refills | Status: DC

## 2020-12-19 ENCOUNTER — Other Ambulatory Visit: Payer: Self-pay | Admitting: Family Medicine

## 2020-12-24 ENCOUNTER — Encounter: Payer: Self-pay | Admitting: Family Medicine

## 2020-12-24 NOTE — Telephone Encounter (Signed)
Started PA, awaiting response.

## 2020-12-26 NOTE — Telephone Encounter (Signed)
Approved 12/25/2020-07/25/2021.

## 2021-01-19 ENCOUNTER — Other Ambulatory Visit: Payer: Self-pay | Admitting: Medical-Surgical

## 2021-01-19 ENCOUNTER — Other Ambulatory Visit: Payer: Self-pay | Admitting: Family Medicine

## 2021-01-20 NOTE — Telephone Encounter (Signed)
Routing to PCP

## 2021-02-12 ENCOUNTER — Other Ambulatory Visit: Payer: Self-pay | Admitting: Family Medicine

## 2021-02-12 ENCOUNTER — Encounter: Payer: Self-pay | Admitting: Family Medicine

## 2021-02-12 ENCOUNTER — Ambulatory Visit: Payer: Managed Care, Other (non HMO) | Admitting: Family Medicine

## 2021-02-12 ENCOUNTER — Other Ambulatory Visit: Payer: Self-pay

## 2021-02-12 MED ORDER — WEGOVY 1.7 MG/0.75ML ~~LOC~~ SOAJ: 1.7000 mg | SUBCUTANEOUS | 0 refills | Status: DC

## 2021-02-19 ENCOUNTER — Encounter: Payer: Self-pay | Admitting: Family Medicine

## 2021-02-19 ENCOUNTER — Ambulatory Visit: Payer: Managed Care, Other (non HMO) | Admitting: Family Medicine

## 2021-02-19 ENCOUNTER — Ambulatory Visit (INDEPENDENT_AMBULATORY_CARE_PROVIDER_SITE_OTHER): Payer: Managed Care, Other (non HMO) | Admitting: Family Medicine

## 2021-02-19 ENCOUNTER — Other Ambulatory Visit: Payer: Self-pay

## 2021-02-19 MED ORDER — SPIRONOLACTONE 50 MG PO TABS: 50.0000 mg | ORAL_TABLET | Freq: Every day | ORAL | 1 refills | Status: DC

## 2021-02-19 MED ORDER — DULOXETINE HCL 30 MG PO CPEP
ORAL_CAPSULE | ORAL | 2 refills | Status: DC
Start: 2021-02-19 — End: 2021-11-24

## 2021-02-19 NOTE — Patient Instructions (Signed)
Keep up the great work! See me again in 3 months.

## 2021-02-19 NOTE — Assessment & Plan Note (Signed)
She is doing great with Wegovy so far with 23lb weight loss over the past few months.  Encouraged to keep up lifestyle and dietary changes.  She will continue on wegovy and titration to 2.4mg .  Discussed that if she experiences worsening side effects to let me know and we can return to previous dose.

## 2021-02-19 NOTE — Progress Notes (Signed)
Christy Todd - 60 y.o. female MRN [id number]  Date of birth: [birth year]  Subjective No chief complaint on file.   HPI Christy Todd is here today for follow up for weight management.  She has been taking Wegovy for about 3 months.  Overall she is doing very well with this.  Weight down 23 lbs based on our scales.  She has had some constipation with this but overall she is feeling well.  Her chronic joint pain has improved with weight loss. She is walking more because of less pain. She is very happy with her progress so far.    ROS:  A comprehensive ROS was completed and negative except as noted per HPI  Allergies  Allergen Reactions  . Gluten Meal Other (See Comments)    Gut Distress  . Ciprofloxacin   . Claritin [Loratadine]   . Eggs-Apples-Oats [Alimentum]     Mouth Blisters  . Septra [Sulfamethoxazole-Trimethoprim]   . Eggs-Apples-Oats [Alimentum] Other (See Comments)    Chemical burn to mouth and gums    Past Medical History:  Diagnosis Date  . Asthma   . Hypertension   . Hypothyroid   . Wears hearing aid in right ear     Past Surgical History:  Procedure Laterality Date  . ABDOMINAL HYSTERECTOMY    . ANKLE RECONSTRUCTION    . CHOLECYSTECTOMY    . CYSTOCELE REPAIR    . RECTOCELE REPAIR    . TENDON REPAIR      Social History   Socioeconomic History  . Marital status: Married    Spouse name: Chantille Navarrete  . Number of children: Not on file  . Years of education: Not on file  . Highest education level: Not on file  Occupational History  . Not on file  Tobacco Use  . Smoking status: Never Smoker  . Smokeless tobacco: Never Used  Vaping Use  . Vaping Use: Never used  Substance and Sexual Activity  . Alcohol use: Yes    Alcohol/week: 1.0 standard drink    Types: 1 Standard drinks or equivalent per week    Comment: Rarely  . Drug use: Never  . Sexual activity: Yes    Partners: Male  Other Topics Concern  . Not on file  Social History Narrative  . Not  on file   Social Determinants of Health   Financial Resource Strain: Not on file  Food Insecurity: Not on file  Transportation Needs: Not on file  Physical Activity: Not on file  Stress: Not on file  Social Connections: Not on file    Family History  Problem Relation Age of Onset  . Hypertension Father   . Diabetes Maternal Grandmother     Health Maintenance  Topic Date Due  . Hepatitis C Screening  Never done  . HIV Screening  Never done  . PAP SMEAR-Modifier  Never done  . COLONOSCOPY (Pts 45-3yrs Insurance coverage will need to be confirmed)  Never done  . MAMMOGRAM  Never done  . INFLUENZA VACCINE  06/02/2021  . TETANUS/TDAP  08/27/2030  . COVID-19 Vaccine  Completed  . HPV VACCINES  Aged Out     ----------------------------------------------------------------------------------------------------------------------------------------------------------------------------------------------------------------- Physical Exam BP 132/70 (BP Location: Left Arm, Patient Position: Sitting, Cuff Size: Large)   Pulse 74   Temp (!) 97.1 F (36.2 C)   Ht 5\' 6"  (1.676 m)   Wt 263 lb 4.8 oz (119.4 kg)   LMP 11/02/1993   SpO2 96%   BMI 42.50 kg/m   Physical  Exam Constitutional:      Appearance: Normal appearance.  HENT:     Head: Normocephalic and atraumatic.  Eyes:     General: No scleral icterus. Musculoskeletal:     Cervical back: Neck supple.  Neurological:     General: No focal deficit present.     Mental Status: She is alert.  Psychiatric:        Mood and Affect: Mood normal.        Behavior: Behavior normal.     ------------------------------------------------------------------------------------------------------------------------------------------------------------------------------------------------------------------- Assessment and Plan  Morbid obesity ([place]) She is doing great with Wegovy so far with 23lb weight loss over the past few months.  Encouraged  to keep up lifestyle and dietary changes.  She will continue on wegovy and titration to 2.4mg .  Discussed that if she experiences worsening side effects to let me know and we can return to previous dose.    Meds ordered this encounter  Medications  . DULoxetine (CYMBALTA) 30 MG capsule    Sig: 2 BY MOUTH EVERY MORNING, 1 BY MOUTH EVERY AT NIGHT    Dispense:  270 capsule    Refill:  2  . spironolactone (ALDACTONE) 50 MG tablet    Sig: Take 1 tablet (50 mg total) by mouth daily.    Dispense:  90 tablet    Refill:  1    Return in about 3 months (around 05/21/2021) for weight management.    This visit occurred during the SARS-CoV-2 public health emergency.  Safety protocols were in place, including screening questions prior to the visit, additional usage of staff PPE, and extensive cleaning of exam room while observing appropriate contact time as indicated for disinfecting solutions.

## 2021-03-12 ENCOUNTER — Encounter: Payer: Self-pay | Admitting: Family Medicine

## 2021-03-12 ENCOUNTER — Other Ambulatory Visit: Payer: Self-pay | Admitting: Family Medicine

## 2021-03-12 MED ORDER — WEGOVY 1.7 MG/0.75ML ~~LOC~~ SOAJ: 1.7000 mg | SUBCUTANEOUS | 1 refills | Status: DC

## 2021-04-03 ENCOUNTER — Other Ambulatory Visit: Payer: Self-pay | Admitting: Family Medicine

## 2021-04-13 ENCOUNTER — Encounter: Payer: Self-pay | Admitting: Family Medicine

## 2021-04-14 MED ORDER — WEGOVY 2.4 MG/0.75ML ~~LOC~~ SOAJ: 2.4000 mg | SUBCUTANEOUS | 3 refills | Status: DC

## 2021-04-22 ENCOUNTER — Encounter: Payer: Self-pay | Admitting: Family Medicine

## 2021-04-29 ENCOUNTER — Ambulatory Visit: Payer: Self-pay

## 2021-04-29 ENCOUNTER — Ambulatory Visit (INDEPENDENT_AMBULATORY_CARE_PROVIDER_SITE_OTHER): Payer: Managed Care, Other (non HMO) | Admitting: Orthopaedic Surgery

## 2021-04-29 ENCOUNTER — Encounter: Payer: Self-pay | Admitting: Orthopaedic Surgery

## 2021-04-29 ENCOUNTER — Other Ambulatory Visit: Payer: Self-pay

## 2021-04-29 ENCOUNTER — Ambulatory Visit (INDEPENDENT_AMBULATORY_CARE_PROVIDER_SITE_OTHER): Payer: Managed Care, Other (non HMO)

## 2021-04-29 VITALS — Ht 66.0 in | Wt 240.0 lb

## 2021-04-29 DIAGNOSIS — M25551 Pain in right hip: Secondary | ICD-10-CM | POA: Diagnosis not present

## 2021-04-29 NOTE — Progress Notes (Signed)
Office Visit Note   Patient: Christy Todd           Date of Birth: 05-Dec-1960           MRN: [id number] Visit Date: 04/29/2021              Requested by: Luetta Nutting, [address] [place],  [place] [address] PCP: Luetta Nutting, DO   Assessment & Plan: Visit Diagnoses:  1. Pain in right hip     Plan: Impression is right hip pain secondary to osteoarthritis. My suspicion is that her recent fall aggravated her underlying DJD. We recommended proceeding with an intra-articular hip injection today for therapeutic relief. She was able to have this done with Dr. Junius Roads today. If this fails to provided significant pain relief we'll consider ordering an MRI of her hip. Otherwise continue with weight loss efforts. We will see her back as needed.   Follow-Up Instructions: Return if symptoms worsen or fail to improve.   Orders:  Orders Placed This Encounter  Procedures   XR HIP UNILAT W OR W/O PELVIS 2-3 VIEWS RIGHT   No orders of the defined types were placed in this encounter.     Procedures: No procedures performed   Clinical Data: No additional findings.   Subjective: No chief complaint on file.   HPI Christy Todd is a 60 y.o. female who presents for evaluation of right hip pain. She had an injury 1 month ago where she slipped on the stairs and landed on her right backside. Since then her hip pain has progressively worsened. She describes pain deep in her groin which wraps around the lateral hip, made worse with hip flexion and external rotation. She endorses start up pain and occasional catching in the hip. She has been taking Motrin and Tylenol with minimal pain relief. She denies low back pain, pain or numbness in the right lower extremity. 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 Her right hip pain feels similar to the pain she has on the left. She had a hip injection on  the left side that provided a few hours of pain relief from the numbing medication. She is in the processes of losing weight to qualify for a left hip replacement. She has lost 50 pounds in the last 6 months. Her BMI is currently at 38.2.   Review of Systems Review of Systems was reviewed and negative unless as stated in the HPI.   Objective: Vital Signs: Ht 5\' 6"  (1.676 m)   Wt 108.9 kg   LMP 11/02/1993   BMI 38.74 kg/m   Physical Exam Vitals and nursing note reviewed.  Constitutional:      Appearance: She is well-developed.  Pulmonary:     Effort: Pulmonary effort is normal.  Skin:    General: Skin is warm.     Capillary Refill: Capillary refill takes less than 2 seconds.  Neurological:     Mental Status: She is alert and oriented to person, place, and time.  Psychiatric:        Behavior: Behavior normal.        Thought Content: Thought content normal.        Judgment: Judgment normal.    Ortho Exam Right hip exam demonstrates positive log roll. Groin pain with hip flexion. Positive Faber, negative Fadir. No tenderness of the greater trochanter. Distal neurovascular exam is intact.  Specialty Comments:  No specialty comments available.  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   History of seizure 07/02/2020   Obesity with alveolar hypoventilation and body mass index (BMI) of 40 or greater ([place]) 07/02/2020   Severe obstructive sleep apnea-hypopnea syndrome 07/02/2020   OSA on CPAP 07/02/2020   Essential hypertension 05/07/2020   Chronic ankle pain 05/07/2020   Degenerative joint disease (DJD) of hip 05/07/2020   Seizure disorder ([place]) 05/07/2020   Hypothyroidism 05/07/2020   Mild persistent asthma 05/07/2020   Past Medical History:  Diagnosis Date   Asthma    Hypertension    Hypothyroid    Wears hearing aid in right ear     Family  History  Problem Relation Age of Onset   Hypertension Father    Diabetes Maternal Grandmother     Past Surgical History:  Procedure Laterality Date   ABDOMINAL HYSTERECTOMY     ANKLE RECONSTRUCTION     CHOLECYSTECTOMY     CYSTOCELE REPAIR     RECTOCELE REPAIR     TENDON REPAIR     Social History   Occupational History   Not on file  Tobacco Use   Smoking status: Never   Smokeless tobacco: Never  Vaping Use   Vaping Use: Never used  Substance and Sexual Activity   Alcohol use: Yes    Alcohol/week: 1.0 standard drink    Types: 1 Standard drinks or equivalent per week    Comment: Rarely   Drug use: Never   Sexual activity: Yes    Partners: Male

## 2021-04-29 NOTE — Progress Notes (Signed)
Subjective: Patient is here for ultrasound-guided intra-articular right hip injection.   Pain from DJD.  Objective:  Pain with IR.  Procedure: Ultrasound guided injection is preferred based studies that show increased duration, increased effect, greater accuracy, decreased procedural pain, increased response rate, and decreased cost with ultrasound guided versus blind injection.   Verbal informed consent obtained.  Time-out conducted.  Noted no overlying erythema, induration, or other signs of local infection. Ultrasound-guided right hip injection: After sterile prep with Betadine, injected 4 cc 0.25% bupivacaine without epinephrine and 6 mg betamethasone using a 22-gauge spinal needle, passing the needle through the iliofemoral ligament into the femoral head/neck junction.  Injectate seen filling joint capsule.  Moderate effusion present.

## 2021-05-08 ENCOUNTER — Encounter: Payer: Self-pay | Admitting: Orthopaedic Surgery

## 2021-05-12 ENCOUNTER — Ambulatory Visit (INDEPENDENT_AMBULATORY_CARE_PROVIDER_SITE_OTHER): Payer: Managed Care, Other (non HMO) | Admitting: Adult Health

## 2021-05-12 VITALS — BP 118/76 | HR 82 | Ht 66.0 in | Wt 238.8 lb

## 2021-05-12 DIAGNOSIS — Z9989 Dependence on other enabling machines and devices: Secondary | ICD-10-CM | POA: Diagnosis not present

## 2021-05-12 DIAGNOSIS — G4733 Obstructive sleep apnea (adult) (pediatric): Secondary | ICD-10-CM | POA: Diagnosis not present

## 2021-05-12 NOTE — Patient Instructions (Signed)
Continue using CPAP nightly and greater than 4 hours each night °If your symptoms worsen or you develop new symptoms please let us know.  ° °
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
## 2021-05-21 ENCOUNTER — Other Ambulatory Visit: Payer: Self-pay | Admitting: Family Medicine

## 2021-05-23 ENCOUNTER — Other Ambulatory Visit: Payer: Self-pay

## 2021-05-23 ENCOUNTER — Ambulatory Visit (INDEPENDENT_AMBULATORY_CARE_PROVIDER_SITE_OTHER): Payer: Managed Care, Other (non HMO) | Admitting: Family Medicine

## 2021-05-23 ENCOUNTER — Encounter: Payer: Self-pay | Admitting: Family Medicine

## 2021-05-23 VITALS — BP 116/64 | Temp 97.4°F | Ht 66.0 in | Wt 237.0 lb

## 2021-05-23 DIAGNOSIS — M1651 Unilateral post-traumatic osteoarthritis, right hip: Secondary | ICD-10-CM

## 2021-05-23 DIAGNOSIS — I10 Essential (primary) hypertension: Secondary | ICD-10-CM

## 2021-05-23 DIAGNOSIS — Z23 Encounter for immunization: Secondary | ICD-10-CM | POA: Diagnosis not present

## 2021-05-23 DIAGNOSIS — R1319 Other dysphagia: Secondary | ICD-10-CM | POA: Diagnosis not present

## 2021-05-23 DIAGNOSIS — Z1211 Encounter for screening for malignant neoplasm of colon: Secondary | ICD-10-CM

## 2021-05-23 MED ORDER — LISINOPRIL 20 MG PO TABS: 20.0000 mg | ORAL_TABLET | Freq: Every day | ORAL | 3 refills | Status: DC

## 2021-05-23 MED ORDER — TRAMADOL HCL 50 MG PO TABS: 50.0000 mg | ORAL_TABLET | Freq: Three times a day (TID) | ORAL | 0 refills | Status: AC | PRN

## 2021-05-23 NOTE — Patient Instructions (Signed)
Great to see you! Try tramadol as needed for pain.  Schedule with Gyn for updated pap/mammogram I have entered referral to GI.  You will be contacted for appt.
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
## 2021-05-24 ENCOUNTER — Encounter: Payer: Self-pay | Admitting: Orthopaedic Surgery

## 2021-05-25 NOTE — Assessment & Plan Note (Addendum)
She continues to have fairly good success with weight loss.  Her BMI is now down to 38 which will allow her to have her hip replacement.  I think this will allow for her to increase her exercise as well for optimal weight loss.  We will continue some semaglutide at 2.4 mg weekly

## 2021-05-25 NOTE — Assessment & Plan Note (Signed)
We discussed trying to limit her NSAID use and Tylenol is not adequately controlling her pain.  We will add some tramadol use as needed for severe pain.

## 2021-05-25 NOTE — Assessment & Plan Note (Signed)
Christy Todd's blood pressure remains well controlled with lisinopril 20 mg daily.  Recommend continuation at current strength for now.

## 2021-05-26 ENCOUNTER — Other Ambulatory Visit: Payer: Self-pay

## 2021-05-26 DIAGNOSIS — M25551 Pain in right hip: Secondary | ICD-10-CM

## 2021-05-26 NOTE — Telephone Encounter (Signed)
Let's get MRI please.  Thanks.

## 2021-05-31 ENCOUNTER — Other Ambulatory Visit: Payer: Self-pay

## 2021-05-31 ENCOUNTER — Ambulatory Visit (INDEPENDENT_AMBULATORY_CARE_PROVIDER_SITE_OTHER): Payer: Managed Care, Other (non HMO)

## 2021-05-31 DIAGNOSIS — M25551 Pain in right hip: Secondary | ICD-10-CM | POA: Diagnosis not present

## 2021-05-31 DIAGNOSIS — G8929 Other chronic pain: Secondary | ICD-10-CM

## 2021-05-31 IMAGING — MR MR HIP*R* W/O CM
4 of 5 series · 26 of 40 positions shown · non-contrast
Comparison: [DATE]

CLINICAL DATA: Chronic right hip pain for 2 months.  Prior fall.

EXAM:
MR OF THE RIGHT HIP WITHOUT CONTRAST
TECHNIQUE: Multiplanar, multisequence MR imaging was performed. No intravenous
contrast was administered.

[Series 3: T1 · coronal · 4.0mm · 1.48mm/px · 8 of 40 slices shown]
[im 1/40]
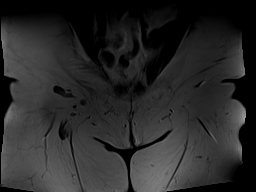
[im 5/40]
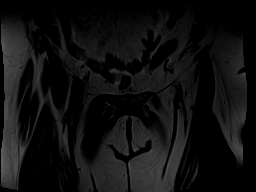
[im 14/40]
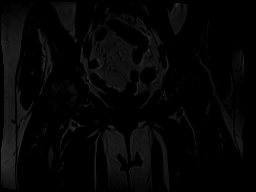
[im 18/40]
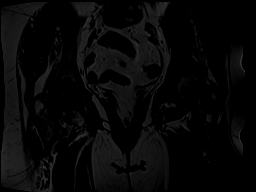
[im 22/40]
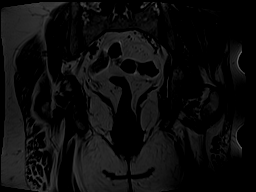
[im 27/40]
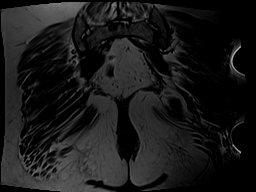
[im 35/40]
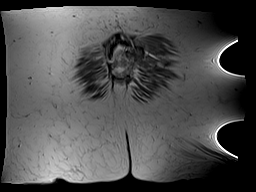
[im 40/40]
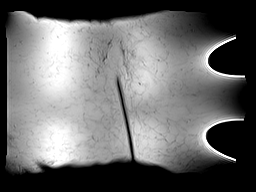

[Series 4: STIR · coronal · 4.0mm · 0.74mm/px · 5 of 35 slices shown]
[im 1/35]
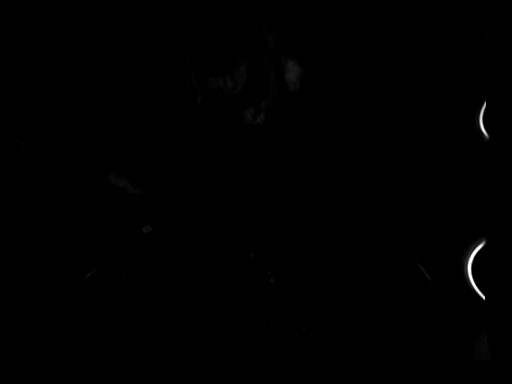
[im 5/35]
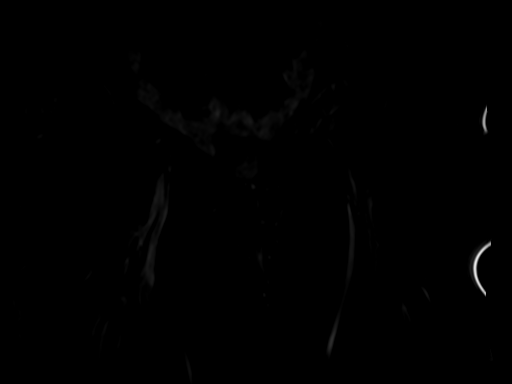
[im 9/35]
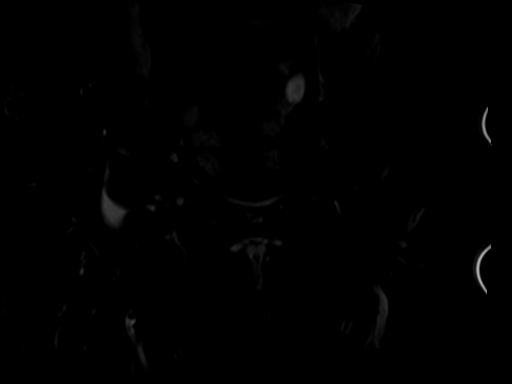
[im 18/35]
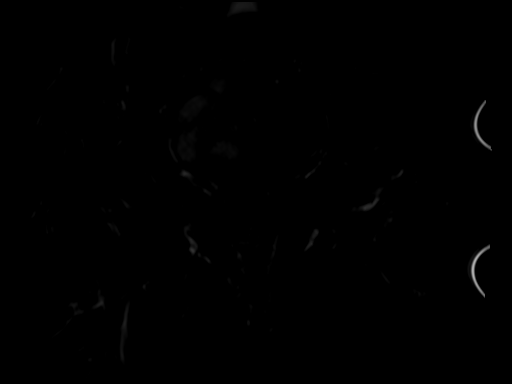
[im 30/35]
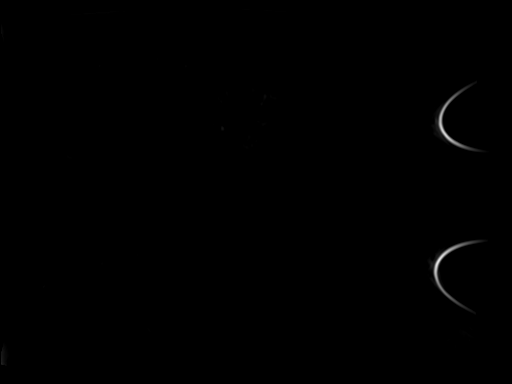

[Series 6: PD fat-sat · coronal · 4.0mm · 0.35mm/px · 6 of 23 slices shown (1 of 2)]
[im 1/23]
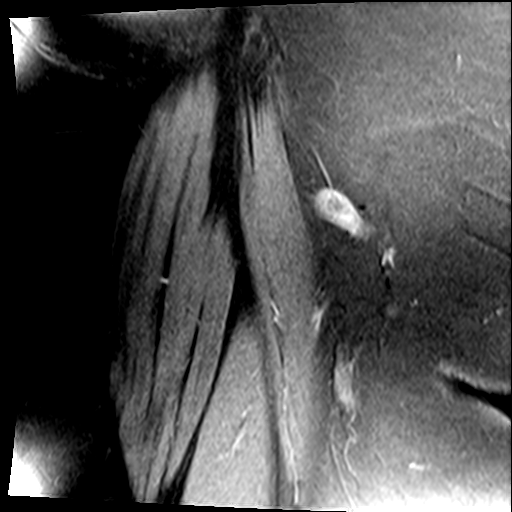
[im 5/23]
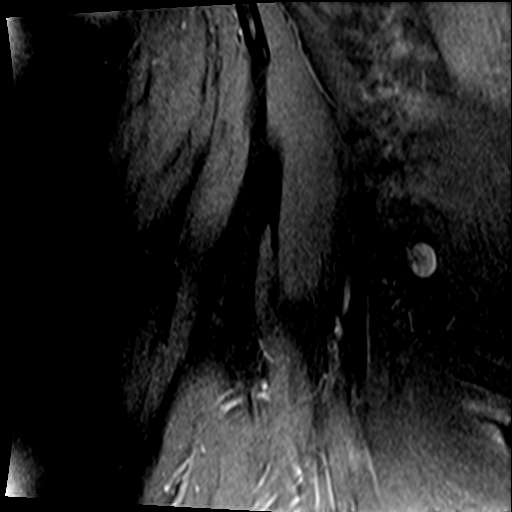
[im 9/23]
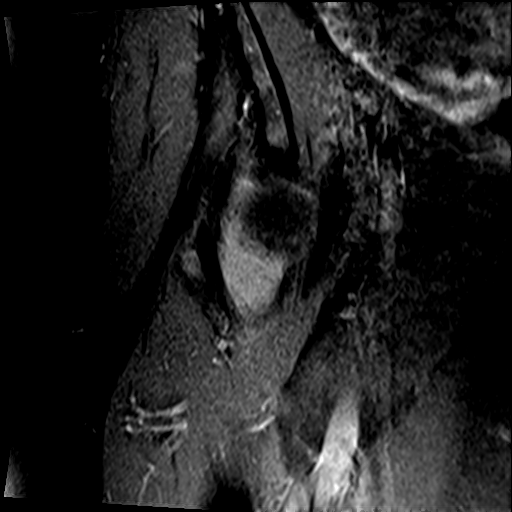
[im 14/23]
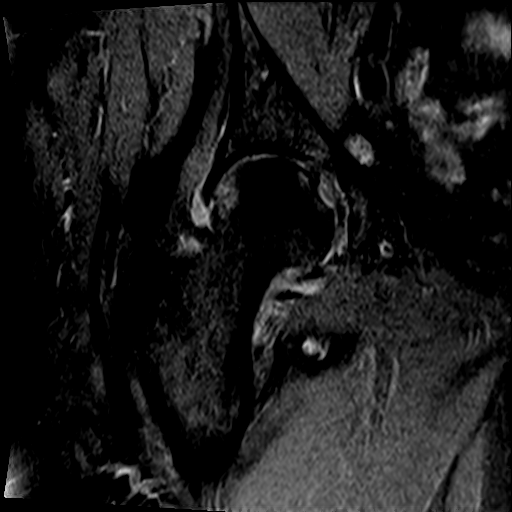
[im 18/23]
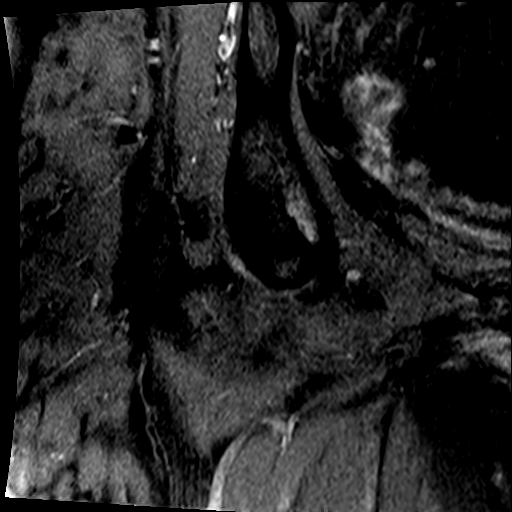
[im 23/23]
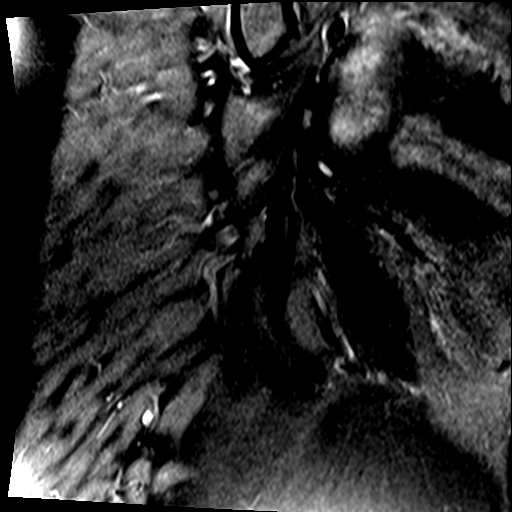

[Series 7: PD fat-sat · sagittal · 4.0mm · 0.35mm/px · 7 of 28 slices shown (2 of 2)]
[im 1/28]
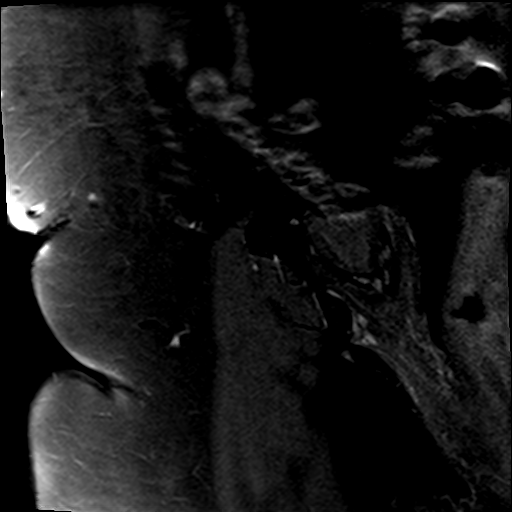
[im 5/28]
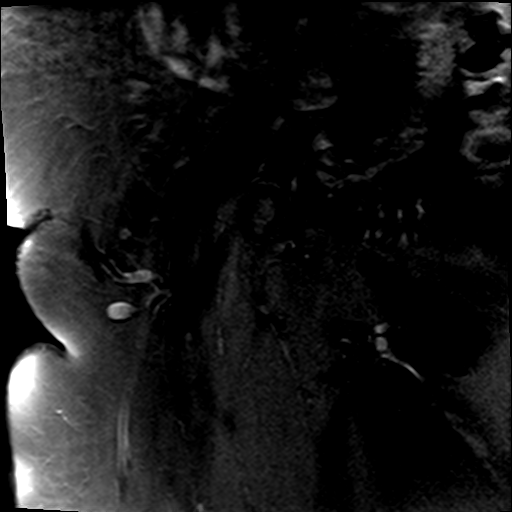
[im 10/28]
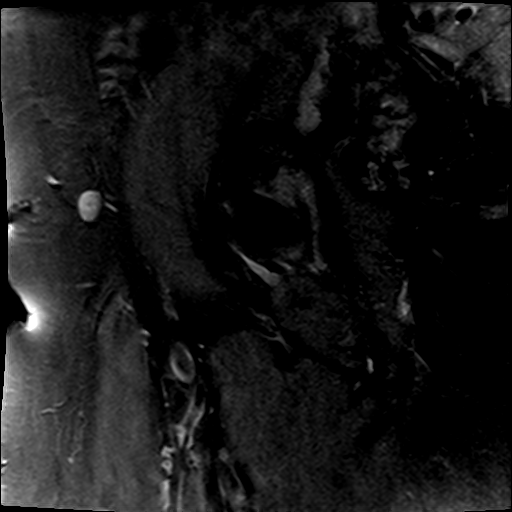
[im 14/28]
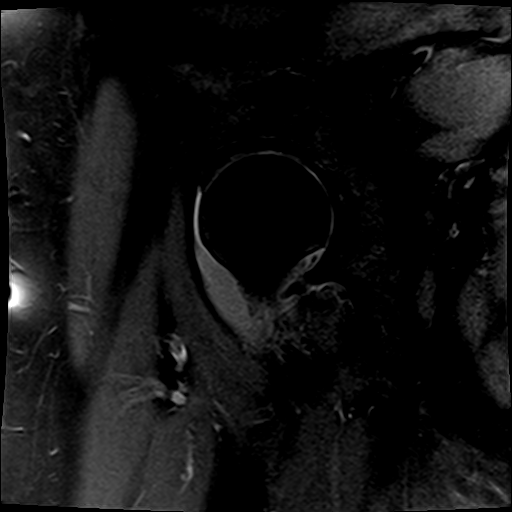
[im 19/28]
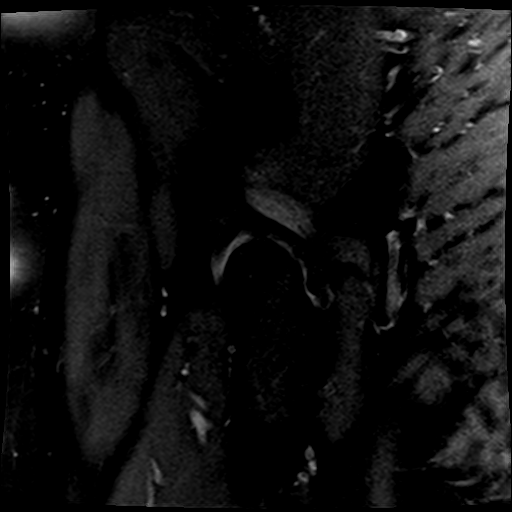
[im 23/28]
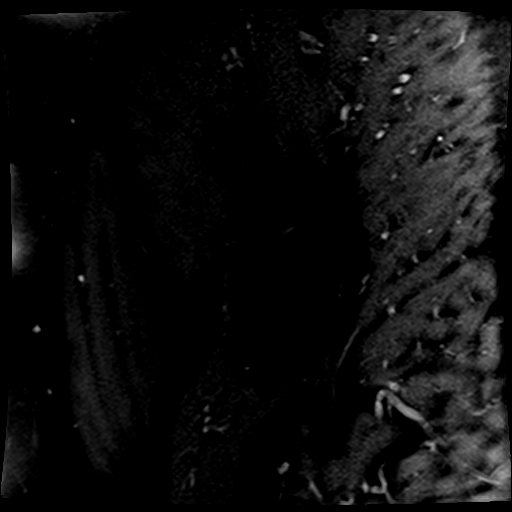
[im 28/28]
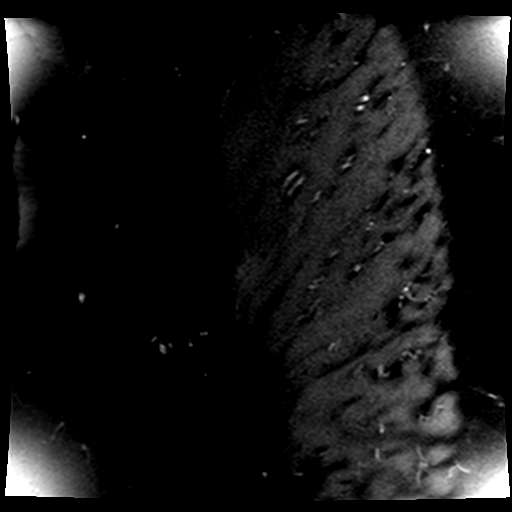

[26 of 40 positions shown; findings below may reference images not displayed]

FINDINGS: Bones: Moderate osteoarthritis of both hips with spurring and
degenerative subcortical cyst formation. Small focus of nonspecific
marrow edema laterally in the right femoral head on image 25 series
4. Similar lateral subcortical marrow edema in the contralateral
(left) hip.

Degenerative disc disease at the L5-S1 level. No findings of
avascular necrosis.

Articular cartilage and labrum

Articular cartilage:  Moderate chondral thinning bilaterally.

Labrum: Linear accentuated signal along the anterior superior labrum
on images 11-12 of series 7. Given the presence on 2 consecutive
images this is suspicious for a small anterior superior labral tear
rather than labral recess.

Joint or bursal effusion

Joint effusion:  Moderate right and small left hip joint effusions.

Bursae: No regional bursitis.

Muscles and tendons

Muscles and tendons: Small amount of accentuated signal between the
left pubis and adjacent abductor aponeurosis on images 33 through 36
of series 4, such that a small left hip adductor aponeurosis tear is
not readily excluded.

Other findings

Miscellaneous:   Scattered sigmoid colon diverticula.
IMPRESSION: 1. Moderate right and mild left hip joint effusions.
2. Moderate osteoarthritis of both hips.
3. Degenerative disc disease at L5-S1.
4. Suspected small linear tear of the right anterior superior
labrum.
5. Trace accentuated signal between the left pubis and left adductor
aponeurosis reason the polyp possibility of a small adductor
aponeurosis tear.

## 2021-06-06 ENCOUNTER — Ambulatory Visit (INDEPENDENT_AMBULATORY_CARE_PROVIDER_SITE_OTHER): Payer: Managed Care, Other (non HMO) | Admitting: Orthopaedic Surgery

## 2021-06-06 ENCOUNTER — Encounter: Payer: Self-pay | Admitting: Orthopaedic Surgery

## 2021-06-06 ENCOUNTER — Ambulatory Visit: Payer: Self-pay

## 2021-06-06 ENCOUNTER — Other Ambulatory Visit: Payer: Self-pay

## 2021-06-06 ENCOUNTER — Ambulatory Visit (INDEPENDENT_AMBULATORY_CARE_PROVIDER_SITE_OTHER): Payer: Managed Care, Other (non HMO)

## 2021-06-06 VITALS — Ht 66.0 in | Wt 229.8 lb

## 2021-06-06 DIAGNOSIS — M1611 Unilateral primary osteoarthritis, right hip: Secondary | ICD-10-CM | POA: Diagnosis not present

## 2021-06-06 NOTE — Progress Notes (Signed)
Office Visit Note   Patient: Christy Todd           Date of Birth: 11-May-1961           MRN: [id number] Visit Date: 06/06/2021              Requested by: Luetta Nutting, [address],  [place] [address] PCP: Luetta Nutting, DO   Assessment & Plan: Visit Diagnoses:  1. Primary osteoarthritis of right hip     Plan: MRI of the pelvis shows moderate degenerative joint disease in both hips with large joint effusion of the right hip.  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  These findings were reviewed with the patient and treatment options were explained for continued nonsurgical treatment with periodic injections, continue weight loss, physical therapy, medications versus a total hip replacement.  She has already done extensive nonsurgical treatments that included the above-mentioned and she has not had any significant relief proceed with a right total hip replacement in the near future.  Risk benefits rehab recovery reviewed with the patient in detail.  She did want to try an aspiration of the hip effusion today.  Toradol was injected in as well.  She will call Jackelyn Poling to schedule surgery sometime in September.  Prior cortisone injection was June 2018.  Follow-Up Instructions: Return for postop.   Orders:  Orders Placed This Encounter  Procedures   XR Pelvis 1-2 Views   US Guided Needle Placement - No Linked Charges   No orders of the defined types were placed in this encounter.     Procedures: No procedures performed   Clinical Data: No additional findings.   Subjective: Chief Complaint  Patient presents with   Right Hip - Follow-up    MRI review    Christy Todd returns today for MRI review of her hips.  Continues to have severe right hip and groin pain that limits her ability to walk and stand and perform daily activities.  Prior cortisone injection on June 28 provided about a week of relief.   Review of  Systems   Objective: Vital Signs: Ht '5\' 6"'$  (1.676 m)   Wt 229 lb 12.8 oz (104.2 kg)   LMP 11/02/1993   BMI 37.09 kg/m   Physical Exam  Ortho Exam  Right hip shows severe pain with attempted internal and external rotation of the hip.  Positive logroll.  Positive FADIR.  Specialty Comments:  No specialty comments available.  Imaging: US Guided Needle Placement - No Linked Charges  Result Date: 06/06/2021 Ultrasound guided injection is preferred based studies that show increased duration, increased effect, greater accuracy, decreased procedural pain, increased response rate, and decreased cost with ultrasound guided versus blind injection.   Verbal informed consent obtained.  Time-out conducted.  Noted no overlying erythema, induration, or other signs of local infection. Ultrasound-guided right hip injection: After sterile prep with Betadine, injected 5 cc 1% lidocaine without epinephrine, then aspirated about 5 cc clear yellow synovial fluid, then injected 60 mg Toradol without complication.    PMFS History: Patient Active Problem List   Diagnosis Date Noted   Primary osteoarthritis of right hip 06/06/2021   Morbid obesity ([place]) 11/14/2020   Sleep behavior disorder, 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   History of seizure 07/02/2020   Obesity with alveolar hypoventilation and body mass index (BMI) of 40 or greater ([place]) 07/02/2020   Severe obstructive sleep apnea-hypopnea syndrome 07/02/2020   OSA on CPAP 07/02/2020  Essential hypertension 05/07/2020   Chronic ankle pain 05/07/2020   Degenerative joint disease (DJD) of hip 05/07/2020   Seizure disorder ([place]) 05/07/2020   Hypothyroidism 05/07/2020   Mild persistent asthma 05/07/2020   Past Medical History:  Diagnosis Date   Asthma    Hypertension    Hypothyroid    Wears hearing aid in right ear     Family History  Problem Relation Age of Onset   Hypertension Father    Diabetes Maternal Grandmother     Past Surgical History:  Procedure  Laterality Date   ABDOMINAL HYSTERECTOMY     ANKLE RECONSTRUCTION     CHOLECYSTECTOMY     CYSTOCELE REPAIR     RECTOCELE REPAIR     TENDON REPAIR     Social History   Occupational History   Not on file  Tobacco Use   Smoking status: Never   Smokeless tobacco: Never  Vaping Use   Vaping Use: Never used  Substance and Sexual Activity   Alcohol use: Yes    Alcohol/week: 1.0 standard drink    Types: 1 Standard drinks or equivalent per week    Comment: Rarely   Drug use: Never   Sexual activity: Yes    Partners: Male

## 2021-06-06 NOTE — Progress Notes (Signed)
Subjective: Patient is here for ultrasound-guided intra-articular right hip injection.   Prior injection helped for 3 days.  She is trying to delay replacement until after September.  She is here for attempted aspiration and Toradol injection.  Objective: Pain with internal rotation.  Procedure: Ultrasound guided injection is preferred based studies that show increased duration, increased effect, greater accuracy, decreased procedural pain, increased response rate, and decreased cost with ultrasound guided versus blind injection.   Verbal informed consent obtained.  Time-out conducted.  Noted no overlying erythema, induration, or other signs of local infection. Ultrasound-guided right hip injection: After sterile prep with Betadine, injected 5 cc 1% lidocaine without epinephrine, then aspirated about 5 cc clear yellow synovial fluid, then injected 60 mg Toradol without complication.

## 2021-06-11 ENCOUNTER — Other Ambulatory Visit: Payer: Self-pay | Admitting: Family Medicine

## 2021-06-20 ENCOUNTER — Telehealth: Payer: Self-pay | Admitting: Orthopaedic Surgery

## 2021-06-20 NOTE — Telephone Encounter (Signed)
Patient is scheduled for right total hip arthroplasty 08-25-21 at 7:15am w/ Dr. Erlinda Hong.    She is asking if she will need to bank her own blood.    Cb  [phone] ok to leave voice mail message or respond thru my chart

## 2021-06-23 ENCOUNTER — Other Ambulatory Visit: Payer: Self-pay

## 2021-06-24 NOTE — Telephone Encounter (Signed)
Sent mychart msg.

## 2021-07-01 ENCOUNTER — Ambulatory Visit (INDEPENDENT_AMBULATORY_CARE_PROVIDER_SITE_OTHER): Payer: Managed Care, Other (non HMO) | Admitting: Gastroenterology

## 2021-07-01 ENCOUNTER — Other Ambulatory Visit: Payer: Self-pay

## 2021-07-01 ENCOUNTER — Encounter: Payer: Self-pay | Admitting: Gastroenterology

## 2021-07-01 VITALS — BP 126/78 | HR 75 | Ht 66.0 in | Wt 229.0 lb

## 2021-07-01 DIAGNOSIS — K219 Gastro-esophageal reflux disease without esophagitis: Secondary | ICD-10-CM | POA: Diagnosis not present

## 2021-07-01 DIAGNOSIS — Z860100 Personal history of colon polyps, unspecified: Secondary | ICD-10-CM

## 2021-07-01 DIAGNOSIS — R1310 Dysphagia, unspecified: Secondary | ICD-10-CM

## 2021-07-01 DIAGNOSIS — Z6837 Body mass index (BMI) 37.0-37.9, adult: Secondary | ICD-10-CM

## 2021-07-01 DIAGNOSIS — K449 Diaphragmatic hernia without obstruction or gangrene: Secondary | ICD-10-CM | POA: Diagnosis not present

## 2021-07-01 NOTE — Patient Instructions (Signed)
If you are age 59 or older, your body mass index should be between 23-30. Your Body mass index is 36.96 kg/m. If this is out of the aforementioned range listed, please consider follow up with your Primary Care Provider.  If you are age 74 or younger, your body mass index should be between 19-25. Your Body mass index is 36.96 kg/m. If this is out of the aformentioned range listed, please consider follow up with your Primary Care Provider.   __________________________________________________________  The [facility] providers would like to encourage you to use [facility] to communicate with providers for non-urgent requests or questions.  Due to long hold times on the telephone, sending your provider a message by [facility] may be a faster and more efficient way to get a response.  Please allow 48 business hours for a response.  Please remember that this is for non-urgent requests.   You have been scheduled for an endoscopy and colonoscopy. Please follow the written instructions given to you at your visit today. Please pick up your prep supplies at the pharmacy within the next 1-3 days. If you use inhalers (even only as needed), please bring them with you on the day of your procedure.  We have given you samples of the following medication to take: Clenpiq  Please call with any questions or concerns.  It was a pleasure to see you today!  Vito Cirigliano, D.O.

## 2021-07-01 NOTE — Progress Notes (Signed)
Chief Complaint: Dysphagia, discuss colon cancer screening/polyp surveillance  Referring Provider:     Luetta Nutting, DO   HPI:     Christy Todd is a 60 y.o. female with a history of HTN, obesity (BMI 36), hypothyroidism, asthma, osteoarthritis of the right hip, cholecystectomy, hysterectomy, cystocele/rectocele repair, referred to the [facility] for evaluation of dysphagia.  She states she has had solid food dysphagia for the last 10 years, but worsening over the last year or so.  Points to suprasternal notch. Has modified her diet, cuts food into small pieces. No issue with liquids. No food impactions. Has had EGD approx ~5 years ago in [place], [place], but not sure if esophageal dilation was performed.   She reports a history of GERD and hiatal hernia, and has been taking Prilosec 20 mg/day for the last 15 years or so with control. Index sxs of HB, regurgitation.  Separately, she would like to discuss having another colonoscopy for ongoing screening. Reports last colonoscopy was ~5 years ago in [place], [place]. She thinks there were polyps and was recommended to repeat in 5 years. No report available for review- requesting records today.   Finally, reports a history of Celiac Disease, and maintains gluten-free diet.  Diagnosed by her chiropractor years ago.  Not sure if she had serologies or duodenal biopsies prior to GFD, but GFD control symptoms well.  She has been intentionally losing weight for overall improved health and as a means to get to right hip replacement with BMI now <40.  Does take NSAIDs to manage osteoarthritis pain.  Recently started taking tramadol for this to try to minimize NSAID use.  No recent labs or abdominal imaging for review.  No known family history of CRC, GI malignancy, liver disease, pancreatic disease, or IBD.    Past Medical History:  Diagnosis Date   Arthritis    Asthma    Hypertension    Hypothyroid    Sleep apnea    on CPAP    Wears hearing aid in right ear      Past Surgical History:  Procedure Laterality Date   ABDOMINAL HYSTERECTOMY     ANKLE RECONSTRUCTION     CHOLECYSTECTOMY     CYSTOCELE REPAIR     RECTOCELE REPAIR     TENDON REPAIR     Family History  Problem Relation Age of Onset   Hypertension Father    Pancreatic cancer Father    Diabetes Maternal Grandmother    Colon cancer Neg Hx    Stomach cancer Neg Hx    Esophageal cancer Neg Hx    Social History   Tobacco Use   Smoking status: Never   Smokeless tobacco: Never  Vaping Use   Vaping Use: Never used  Substance Use Topics   Alcohol use: Yes    Alcohol/week: 1.0 standard drink    Types: 1 Standard drinks or equivalent per week    Comment: Rarely   Drug use: Never   Current Outpatient Medications  Medication Sig Dispense Refill   ALBUTEROL SULFATE IN Inhale into the lungs.     beclomethasone (QVAR) 80 MCG/ACT inhaler Inhale 2 puffs into the lungs 2 (two) times daily.     Cholecalciferol (VITAMIN D3) 50 MCG (2000 UT) TABS Take by mouth.     DULoxetine (CYMBALTA) 30 MG capsule 2 BY MOUTH EVERY MORNING, 1 BY MOUTH EVERY AT NIGHT 270 capsule 2   ibuprofen (ADVIL) 800  MG tablet TAKE 1 TABLET BY MOUTH EVERY 8 HOURS AS NEEDED 60 tablet 1   levothyroxine (SYNTHROID) 137 MCG tablet TAKE 1 TABLET BY MOUTH EVERY DAY IN THE MORNING 90 tablet 1   lisinopril (ZESTRIL) 20 MG tablet Take 1 tablet (20 mg total) by mouth daily. 90 tablet 3   metoprolol succinate (TOPROL-XL) 50 MG 24 hr tablet TAKE 1 TABLET BY MOUTH EVERY DAY 90 tablet 1   montelukast (SINGULAIR) 10 MG tablet TAKE 1 TABLET BY MOUTH EVERY DAY 90 tablet 1   nortriptyline (PAMELOR) 25 MG capsule Take 1 capsule (25 mg total) by mouth at bedtime as needed. 90 capsule 3   omeprazole (PRILOSEC) 20 MG capsule Take 20 mg by mouth daily.     WEGOVY 2.4 MG/0.75ML SOAJ Inject 2.4 mg into the skin once a week. 9 mL 3   spironolactone (ALDACTONE) 50 MG tablet Take 1 tablet (50 mg total) by  mouth daily. 90 tablet 1   No current facility-administered medications for this visit.   Allergies  Allergen Reactions   Gluten Meal Other (See Comments)    Gut Distress   Ciprofloxacin    Claritin [Loratadine]    Eggs-Apples-Oats [Alimentum]     Mouth Blisters   Septra [Sulfamethoxazole-Trimethoprim]    Eggs-Apples-Oats [Alimentum] Other (See Comments)    Chemical burn to mouth and gums     Review of Systems: All systems reviewed and negative except where noted in HPI.     Physical Exam:    Wt Readings from Last 3 Encounters:  07/01/21 229 lb (103.9 kg)  06/06/21 229 lb 12.8 oz (104.2 kg)  05/23/21 237 lb (107.5 kg)    BP 126/78   Pulse 75   Ht '5\' 6"'$  (1.676 m)   Wt 229 lb (103.9 kg)   LMP 11/02/1993   SpO2 99%   BMI 36.96 kg/m  Constitutional:  Pleasant, in no acute distress. Psychiatric: Normal mood and affect. Behavior is normal. EENT: Pupils normal.  Conjunctivae are normal. No scleral icterus. Neck supple. No cervical LAD. Cardiovascular: Normal rate, regular rhythm. No edema Pulmonary/chest: Effort normal and breath sounds normal. No wheezing, rales or rhonchi. Abdominal: Soft, nondistended, nontender. Bowel sounds active throughout. There are no masses palpable. No hepatomegaly. Neurological: Alert and oriented to person place and time. Skin: Skin is warm and dry. No rashes noted.   ASSESSMENT AND PLAN;   1) Dysphagia - EGD with possible esophageal dilation and biopsies as appropriate -Advised patient to cut food into small pieces, eat small bites, chew food thoroughly and with plenty of liquids to avoid food impaction. -We will obtain copy of previous EGD report  2) GERD 3) History of hiatal hernia - Continue omeprazole 20 mg/day as this controls her symptoms well - Continue antireflux lifestyle/dietary modifications - EGD to evaluate for erosive esophagitis, LES laxity, and size of hiatal hernia  4) History of colon polyps - Per patient, due  for repeat colonoscopy for ongoing polyp surveillance - Schedule colonoscopy - Obtain copy of previous colonoscopy report  5) History of Celiac Disease - Unclear of methods of diagnosis, but symptoms well controlled with gluten-free diet - Continue gluten-free diet - Can certainly evaluate for villous atrophy, scalloping, etc. at time of EGD as above  6) Obesity (BMI 37) - Has been successful losing weight with dietary modification and Wegovy - Can evaluate for improvement in GI symptoms with continued weight loss and diet  The indications, risks, and benefits of EGD and colonoscopy were explained  to the patient in detail. Risks include but are not limited to bleeding, perforation, adverse reaction to medications, and cardiopulmonary compromise. Sequelae include but are not limited to the possibility of surgery, hospitalization, and mortality. The patient verbalized understanding and wished to proceed. All questions answered, referred to scheduler and bowel prep ordered. Further recommendations pending results of the exam.    Lavena Bullion, DO, FACG  07/01/2021, 9:40 AM   Luetta Nutting, DO

## 2021-07-03 ENCOUNTER — Encounter: Payer: Self-pay | Admitting: Certified Registered Nurse Anesthetist

## 2021-07-04 ENCOUNTER — Encounter: Payer: Self-pay | Admitting: Gastroenterology

## 2021-07-04 ENCOUNTER — Other Ambulatory Visit: Payer: Self-pay

## 2021-07-04 ENCOUNTER — Ambulatory Visit (AMBULATORY_SURGERY_CENTER): Payer: Managed Care, Other (non HMO) | Admitting: Gastroenterology

## 2021-07-04 VITALS — BP 126/71 | HR 72 | Temp 97.3°F | Resp 12 | Ht 66.0 in | Wt 229.0 lb

## 2021-07-04 DIAGNOSIS — K222 Esophageal obstruction: Secondary | ICD-10-CM | POA: Diagnosis not present

## 2021-07-04 DIAGNOSIS — K6289 Other specified diseases of anus and rectum: Secondary | ICD-10-CM

## 2021-07-04 DIAGNOSIS — K2970 Gastritis, unspecified, without bleeding: Secondary | ICD-10-CM

## 2021-07-04 DIAGNOSIS — K319 Disease of stomach and duodenum, unspecified: Secondary | ICD-10-CM

## 2021-07-04 DIAGNOSIS — K635 Polyp of colon: Secondary | ICD-10-CM | POA: Diagnosis not present

## 2021-07-04 DIAGNOSIS — K2990 Gastroduodenitis, unspecified, without bleeding: Secondary | ICD-10-CM | POA: Diagnosis not present

## 2021-07-04 DIAGNOSIS — D125 Benign neoplasm of sigmoid colon: Secondary | ICD-10-CM

## 2021-07-04 DIAGNOSIS — K219 Gastro-esophageal reflux disease without esophagitis: Secondary | ICD-10-CM

## 2021-07-04 DIAGNOSIS — K5730 Diverticulosis of large intestine without perforation or abscess without bleeding: Secondary | ICD-10-CM

## 2021-07-04 DIAGNOSIS — Z860100 Personal history of colon polyps, unspecified: Secondary | ICD-10-CM

## 2021-07-04 DIAGNOSIS — K642 Third degree hemorrhoids: Secondary | ICD-10-CM

## 2021-07-04 DIAGNOSIS — R1310 Dysphagia, unspecified: Secondary | ICD-10-CM

## 2021-07-04 MED ORDER — SODIUM CHLORIDE 0.9 % IV SOLN
500.0000 mL | Freq: Once | INTRAVENOUS | Status: DC
Start: 2021-07-04 — End: 2021-07-04

## 2021-07-04 NOTE — Progress Notes (Signed)
1050 Robinul 0.1 mg IV given due large amount of secretions upon assessment.  MD made aware, vss  

## 2021-07-04 NOTE — Op Note (Signed)
[facility] Patient Name: Christy Todd Procedure Date: 07/04/2021 10:48 AM MRN: [id number] Endoscopist: Gerrit Heck , MD Age: 60 Referring MD:  Date of Birth: 03/23/1961 Gender: Female Account #: [id number] Procedure:                Upper GI endoscopy Indications:              Dysphagia, Suspected esophageal reflux, History of                            Celiac Disease Medicines:                Monitored Anesthesia Care Procedure:                Pre-Anesthesia Assessment:                           - Prior to the procedure, a History and Physical                            was performed, and patient medications and                            allergies were reviewed. The patient's tolerance of                            previous anesthesia was also reviewed. The risks                            and benefits of the procedure and the sedation                            options and risks were discussed with the patient.                            All questions were answered, and informed consent                            was obtained. Prior Anticoagulants: The patient has                            taken no previous anticoagulant or antiplatelet                            agents. ASA Grade Assessment: II - A patient with                            mild systemic disease. After reviewing the risks                            and benefits, the patient was deemed in                            satisfactory condition to undergo the procedure.                           -  Prior to the procedure, a History and Physical                            was performed, and patient medications and                            allergies were reviewed. The patient's tolerance of                            previous anesthesia was also reviewed. The risks                            and benefits of the procedure and the sedation                            options and risks were discussed with the  patient.                            All questions were answered, and informed consent                            was obtained. Prior Anticoagulants: The patient has                            taken no previous anticoagulant or antiplatelet                            agents. ASA Grade Assessment: II - A patient with                            mild systemic disease. After reviewing the risks                            and benefits, the patient was deemed in                            satisfactory condition to undergo the procedure.                           After obtaining informed consent, the endoscope was                            passed under direct vision. Throughout the                            procedure, the patient's blood pressure, pulse, and                            oxygen saturations were monitored continuously. The                            GIF [id number] was introduced through the  mouth, and advanced to the second part of duodenum.                            The upper GI endoscopy was accomplished without                            difficulty. The patient tolerated the procedure                            well. Scope In: Scope Out: Findings:                 One benign-appearing, intrinsic mild stenosis was                            found 20 cm from the incisors. This stenosis                            measured 1 cm (in length). The stenosis was                            traversed. A TTS dilator was passed through the                            scope. Dilation with a 16-17-18 mm balloon dilator                            was performed to 17 mm. The dilation site was                            examined and showed mild mucosal disruption and                            moderate improvement in luminal narrowing.                            Estimated blood loss was minimal.                           The middle third of the esophagus and lower  third                            of the esophagus were normal. Biopsies were                            obtained from the proximal and distal esophagus                            with cold forceps for histology of suspected                            eosinophilic esophagitis. Estimated blood loss was  minimal.                           The Z-line was regular and was found 40 cm from the                            incisors.                           The gastroesophageal flap valve was visualized                            endoscopically and classified as Hill Grade II                            (fold present, opens with respiration). No hiatal                            hernia was noted on anterograde or retroflexed                            views.                           Mild inflammation characterized by erythema was                            found in the gastric body and in the gastric                            antrum. Biopsies were taken with a cold forceps for                            Helicobacter pylori testing. Estimated blood loss                            was minimal.                           The examined duodenum was normal. Biopsies for                            histology were taken with a cold forceps for                            evaluation of celiac disease. Estimated blood loss                            was minimal. Complications:            No immediate complications. Estimated Blood Loss:     Estimated blood loss was minimal. Impression:               - Benign-appearing esophageal stenosis. Dilated                            with  17 mm TTS balloon with approriate mucosal rent                            consistent with successful dilation.                           - Normal middle third of esophagus and lower third                            of esophagus. Biopsied.                           - Z-line regular, 40 cm from the incisors.                            - Gastroesophageal flap valve classified as Hill                            Grade II (fold present, opens with respiration).                           - Gastritis. Biopsied.                           - Normal examined duodenum. Biopsied. Recommendation:           - Patient has a contact number available for                            emergencies. The signs and symptoms of potential                            delayed complications were discussed with the                            patient. Return to normal activities tomorrow.                            Written discharge instructions were provided to the                            patient.                           - Continue present medications.                           - Await pathology results.                           - Soft diet today, then advance slowly as tolerated                            tomorrow per post dilation protocol.                           -  Repeat upper endoscopy PRN for retreatment.                           - Return to GI office at appointment to be                            scheduled. Gerrit Heck, MD 07/04/2021 11:39:37 AM

## 2021-07-04 NOTE — Op Note (Signed)
[place] Patient Name: Christy Todd Procedure Date: 07/04/2021 10:40 AM MRN: [id number] Endoscopist: Gerrit Heck , MD Age: 60 Referring MD:  Date of Birth: October 01, 1961 Gender: Female Account #: [id number] Procedure:                Colonoscopy Indications:              High risk colon cancer surveillance: Personal                            history of colonic polyps on last colonoscopy 5                            years ago at outside facility, with recommendation                            to repeat in 5 years for ongoing surveillance. Medicines:                Monitored Anesthesia Care Procedure:                Pre-Anesthesia Assessment:                           - Prior to the procedure, a History and Physical                            was performed, and patient medications and                            allergies were reviewed. The patient's tolerance of                            previous anesthesia was also reviewed. The risks                            and benefits of the procedure and the sedation                            options and risks were discussed with the patient.                            All questions were answered, and informed consent                            was obtained. Prior Anticoagulants: The patient has                            taken no previous anticoagulant or antiplatelet                            agents. ASA Grade Assessment: II - A patient with                            mild systemic disease. After reviewing the risks  and benefits, the patient was deemed in                            satisfactory condition to undergo the procedure.                           After obtaining informed consent, the colonoscope                            was passed under direct vision. Throughout the                            procedure, the patient's blood pressure, pulse, and                            oxygen saturations  were monitored continuously. The                            Olympus CF-HQ190L ([id number]) Colonoscope was                            introduced through the anus and advanced to the the                            terminal ileum. The colonoscopy was performed                            without difficulty. The patient tolerated the                            procedure well. The quality of the bowel                            preparation was good. The terminal ileum, ileocecal                            valve, appendiceal orifice, and rectum were                            photographed. Scope In: 11:10:34 AM Scope Out: 11:27:07 AM Scope Withdrawal Time: 0 hours 13 minutes 44 seconds  Total Procedure Duration: 0 hours 16 minutes 33 seconds  Findings:                 Hemorrhoids were found on perianal exam. The                            hemorrhoids were Grade III (internal hemorrhoids                            that prolapse but require manual reduction) to                            Grade IV (internal hemorrhoids that prolapse and  cannot be reduced manually).                           A 4 mm polyp was found in the sigmoid colon. The                            polyp was sessile. The polyp was removed with a                            cold snare. Resection and retrieval were complete.                            Estimated blood loss was minimal.                           Multiple small-mouthed diverticula were found in                            the sigmoid colon.                           Non-bleeding external and internal hemorrhoids and                            hypertrophied anal papilla were found in the distal                            rectum.                           The terminal ileum appeared normal. Complications:            No immediate complications. Estimated Blood Loss:     Estimated blood loss was minimal. Impression:               - Hemorrhoids  found on perianal exam.                           - One 4 mm polyp in the sigmoid colon, removed with                            a cold snare. Resected and retrieved.                           - Diverticulosis in the sigmoid colon.                           - Hypertrophied anal papilla.                           - The examined portion of the ileum was normal. Recommendation:           - Patient has a contact number available for                            emergencies. The signs and symptoms of potential  delayed complications were discussed with the                            patient. Return to normal activities tomorrow.                            Written discharge instructions were provided to the                            patient.                           - Continue present medications.                           - Await pathology results.                           - Repeat colonoscopy for surveillance based on                            pathology results.                           - If planning on definitive treatment of                            hemorrhoids, based on the Grade 3-4 hemorrhoids on                            exam today, recommend referral to the colo-rectal                            surgery clinic for evaluation and treatment. Gerrit Heck, MD 07/04/2021 11:44:59 AM

## 2021-07-04 NOTE — Progress Notes (Signed)
Report given to PACU, vss 

## 2021-07-04 NOTE — Patient Instructions (Addendum)
Handouts given for gastritis, post dilation diet, diverticulosis, and hemorrhoids.  YOU HAD AN ENDOSCOPIC PROCEDURE TODAY AT [facility]:   Refer to the procedure report that was given to you for any specific questions about what was found during the examination.  If the procedure report does not answer your questions, please call your gastroenterologist to clarify.  If you requested that your care partner not be given the details of your procedure findings, then the procedure report has been included in a sealed envelope for you to review at your convenience later.  YOU SHOULD EXPECT: Some feelings of bloating in the abdomen. Passage of more gas than usual.  Walking can help get rid of the air that was put into your GI tract during the procedure and reduce the bloating. If you had a lower endoscopy (such as a colonoscopy or flexible sigmoidoscopy) you may notice spotting of blood in your stool or on the toilet paper. If you underwent a bowel prep for your procedure, you may not have a normal bowel movement for a few days.  Please Note:  You might notice some irritation and congestion in your nose or some drainage.  This is from the oxygen used during your procedure.  There is no need for concern and it should clear up in a day or so.  SYMPTOMS TO REPORT IMMEDIATELY:  Following lower endoscopy (colonoscopy or flexible sigmoidoscopy):  Excessive amounts of blood in the stool  Significant tenderness or worsening of abdominal pains  Swelling of the abdomen that is new, acute  Fever of 100F or higher  Following upper endoscopy (EGD)  Vomiting of blood or coffee ground material  New chest pain or pain under the shoulder blades  Painful or persistently difficult swallowing  New shortness of breath  Black, tarry-looking stools  For urgent or emergent issues, a gastroenterologist can be reached at any hour by calling [phone]. Do not use MyChart messaging for urgent concerns.     DIET:  We do recommend a small meal at first, SEE POST DILATION DIET HANDOUT, [place],  then you may proceed to your regular diet TOMORROW AS TOLERATED.  Drink plenty of fluids but you should avoid alcoholic beverages for 24 hours.  ACTIVITY:  You should plan to take it easy for the rest of today and you should NOT DRIVE or use heavy machinery until tomorrow (because of the sedation medicines used during the test).    FOLLOW UP: Our staff will call the number listed on your records 48-72 hours following your procedure to check on you and address any questions or concerns that you may have regarding the information given to you following your procedure. If we do not reach you, we will leave a message.  We will attempt to reach you two times.  During this call, we will ask if you have developed any symptoms of COVID 19. If you develop any symptoms (ie: fever, flu-like symptoms, shortness of breath, cough etc.) before then, please call [phone].  If you test positive for Covid 19 in the 2 weeks post procedure, please call and report this information to [place].    If any biopsies were taken you will be contacted by phone or by letter within the next 1-3 weeks.  Please call us at [phone] if you have not heard about the biopsies in 3 weeks.    SIGNATURES/CONFIDENTIALITY: You and/or your care partner have signed paperwork which will be entered into your electronic medical  record.  These signatures attest to the fact that that the information above on your After Visit Summary has been reviewed and is understood.  Full responsibility of the confidentiality of this discharge information lies with you and/or your care-partner.

## 2021-07-04 NOTE — Progress Notes (Signed)
Called to room to assist during endoscopic procedure.  Patient ID and intended procedure confirmed with present staff. Received instructions for my participation in the procedure from the performing physician.  

## 2021-07-04 NOTE — Progress Notes (Signed)
Pt's states no medical or surgical changes since previsit or office visit. VS assessed by C.W 

## 2021-07-04 NOTE — Progress Notes (Signed)
GASTROENTEROLOGY PROCEDURE H&P NOTE   Primary Care Physician: Luetta Nutting, DO    Reason for Procedure:   Dysphagia, GERD, history of hiatal hernia ; History of colon polyps  Plan:    1) EGD with possible esophageal dilation and biopsies ; 2) Colonoscopy  Patient is appropriate for endoscopic procedure(s) in the ambulatory ([facility]) setting.  The nature of the procedure, as well as the risks, benefits, and alternatives were carefully and thoroughly reviewed with the patient. Ample time for discussion and questions allowed. The patient understood, was satisfied, and agreed to proceed.     HPI: Christy Todd is a 60 y.o. female who presents for EGD and colonoscopy for evaluation of solid food dysphagia, GERD, Hiatal hernia, and history of colon polyps. She was seen in the GI Clinic for these issues on 07/01/2021 and scheduled for procedures today. No interval changes in clinical history since that appt.   Past Medical History:  Diagnosis Date   Arthritis    Asthma    Hypertension    Hypothyroid    Sleep apnea    on CPAP   Wears hearing aid in right ear     Past Surgical History:  Procedure Laterality Date   ABDOMINAL HYSTERECTOMY     ANKLE RECONSTRUCTION     CHOLECYSTECTOMY     COLONOSCOPY  12/09/2018   Dr. [facility]elfino [facility].  3 mm sessile polyp, hepatic flexure. Mild - Moderate diverticulosis, sigmoid colon.   CYSTOCELE REPAIR     ESOPHAGOGASTRODUODENOSCOPY  12/09/2018   Dr. [facility]elfino [facility]. Normal upper GI tract   RECTOCELE REPAIR     TENDON REPAIR      Prior to Admission medications   Medication Sig Start Date End Date Taking? Authorizing Provider  Cholecalciferol (VITAMIN D3) 50 MCG (2000 UT) TABS Take by mouth.   Yes [provider]  DULoxetine (CYMBALTA) 30 MG capsule 2 BY MOUTH EVERY MORNING, 1 BY MOUTH EVERY AT NIGHT 02/19/21  Yes Luetta Nutting, DO  ibuprofen (ADVIL) 800 MG tablet TAKE 1 TABLET  BY MOUTH EVERY 8 HOURS AS NEEDED 06/11/21  Yes Luetta Nutting, DO  levothyroxine (SYNTHROID) 137 MCG tablet TAKE 1 TABLET BY MOUTH EVERY DAY IN THE MORNING 05/22/21  Yes Luetta Nutting, DO  lisinopril (ZESTRIL) 20 MG tablet Take 1 tablet (20 mg total) by mouth daily. 05/23/21  Yes Luetta Nutting, DO  metoprolol succinate (TOPROL-XL) 50 MG 24 hr tablet TAKE 1 TABLET BY MOUTH EVERY DAY 01/20/21  Yes Luetta Nutting, DO  montelukast (SINGULAIR) 10 MG tablet TAKE 1 TABLET BY MOUTH EVERY DAY 05/22/21  Yes Luetta Nutting, DO  nortriptyline (PAMELOR) 25 MG capsule Take 1 capsule (25 mg total) by mouth at bedtime as needed. 10/17/20  Yes Luetta Nutting, DO  omeprazole (PRILOSEC) 20 MG capsule Take 20 mg by mouth daily.   Yes [provider]  WEGOVY 2.4 MG/0.75ML SOAJ Inject 2.4 mg into the skin once a week. 04/14/21  Yes Silverio Decamp, MD  ALBUTEROL SULFATE IN Inhale into the lungs.    [provider]  beclomethasone (QVAR) 80 MCG/ACT inhaler Inhale 2 puffs into the lungs 2 (two) times daily. Patient not taking: Reported on 07/04/2021    [provider]  spironolactone (ALDACTONE) 50 MG tablet Take 1 tablet (50 mg total) by mouth daily. 02/19/21 05/20/21  Luetta Nutting, DO    Current Outpatient Medications  Medication Sig Dispense Refill   Cholecalciferol (VITAMIN D3) 50 MCG (2000 UT) TABS Take  by mouth.     DULoxetine (CYMBALTA) 30 MG capsule 2 BY MOUTH EVERY MORNING, 1 BY MOUTH EVERY AT NIGHT 270 capsule 2   ibuprofen (ADVIL) 800 MG tablet TAKE 1 TABLET BY MOUTH EVERY 8 HOURS AS NEEDED 60 tablet 1   levothyroxine (SYNTHROID) 137 MCG tablet TAKE 1 TABLET BY MOUTH EVERY DAY IN THE MORNING 90 tablet 1   lisinopril (ZESTRIL) 20 MG tablet Take 1 tablet (20 mg total) by mouth daily. 90 tablet 3   metoprolol succinate (TOPROL-XL) 50 MG 24 hr tablet TAKE 1 TABLET BY MOUTH EVERY DAY 90 tablet 1   montelukast (SINGULAIR) 10 MG tablet TAKE 1 TABLET BY MOUTH EVERY DAY 90 tablet 1    nortriptyline (PAMELOR) 25 MG capsule Take 1 capsule (25 mg total) by mouth at bedtime as needed. 90 capsule 3   omeprazole (PRILOSEC) 20 MG capsule Take 20 mg by mouth daily.     WEGOVY 2.4 MG/0.75ML SOAJ Inject 2.4 mg into the skin once a week. 9 mL 3   ALBUTEROL SULFATE IN Inhale into the lungs.     beclomethasone (QVAR) 80 MCG/ACT inhaler Inhale 2 puffs into the lungs 2 (two) times daily. (Patient not taking: Reported on 07/04/2021)     spironolactone (ALDACTONE) 50 MG tablet Take 1 tablet (50 mg total) by mouth daily. 90 tablet 1   Current Facility-Administered Medications  Medication Dose Route Frequency Provider Last Rate Last Admin   0.9 %  sodium chloride infusion  500 mL Intravenous Once Innocence Schlotzhauer V, DO        Allergies as of 07/04/2021 - Review Complete 07/04/2021  Allergen Reaction Noted   Gluten meal Other (See Comments) 05/07/2020   Ciprofloxacin  02/15/2020   Claritin [loratadine]  02/15/2020   Eggs-apples-oats [alimentum]  11/15/2020   Septra [sulfamethoxazole-trimethoprim]  02/15/2020   Eggs-apples-oats [alimentum] Other (See Comments) 05/07/2020    Family History  Problem Relation Age of Onset   Hypertension Father    Pancreatic cancer Father    Diabetes Maternal Grandmother    Colon cancer Neg Hx    Stomach cancer Neg Hx    Esophageal cancer Neg Hx     Social History   Socioeconomic History   Marital status: Married    Spouse name: Leverta Schrader   Number of children: Not on file   Years of education: Not on file   Highest education level: Not on file  Occupational History   Not on file  Tobacco Use   Smoking status: Never   Smokeless tobacco: Never  Vaping Use   Vaping Use: Never used  Substance and Sexual Activity   Alcohol use: Yes    Alcohol/week: 1.0 standard drink    Types: 1 Standard drinks or equivalent per week    Comment: Rarely   Drug use: Never   Sexual activity: Yes    Partners: Male  Other Topics Concern   Not on file   Social History Narrative   Not on file   Social Determinants of Health   Financial Resource Strain: Not on file  Food Insecurity: Not on file  Transportation Needs: Not on file  Physical Activity: Not on file  Stress: Not on file  Social Connections: Not on file  Intimate Partner Violence: Not on file    Physical Exam: Vital signs in last 24 hours: '@BP'$  139/77   Pulse 73   Temp (!) 97.3 F (36.3 C) (Skin)   Ht '5\' 6"'$  (1.676 m)   Wt 229  lb (103.9 kg)   LMP 11/02/1993   SpO2 96%   BMI 36.96 kg/m  GEN: NAD EYE: Sclerae anicteric ENT: MMM CV: Non-tachycardic Pulm: CTA b/l GI: Soft, NT/ND NEURO:  Alert & Oriented x 3   Gerrit Heck, DO [facility]   07/04/2021 10:44 AM

## 2021-07-08 ENCOUNTER — Telehealth: Payer: Self-pay

## 2021-07-08 ENCOUNTER — Telehealth: Payer: Self-pay | Admitting: Gastroenterology

## 2021-07-08 NOTE — Telephone Encounter (Signed)
Received previous GI records from the office of [facility] and notable for the following:  History of cholecystectomy, hysterectomy, nephrectomy, bladder repair, foot surgeries.  History of sleep apnea.  - Last appointment on 07/28/2018.  Did report chronic, intermittent dysphagia in the upper esophagus for several months.  No regurgitation, nausea/vomiting.  Has a history of reflux for several years.  Treated with OTC Prilosec.  Reflux symptoms worse at night.  Also with intermittent Upper abdominal bloating.  Has been gluten-free for several years.  Recommended continued omeprazole daily and lifestyle modification along with repeat upper endoscopy with possible dilation and colonoscopy.  Endoscopic History: - EGD (2014, Dr. Patrice Paradise): 3 cm HH, otherwise normal.  Biopsies notable for reflux esophagitis - Colonoscopy (2014, Dr. Patrice Paradise): Normal.  Recommended repeat in 5 years due to family history of colon polyps - EGD (12/09/2018, Dr. Elton Sin): Normal esophagus.  Normal GE junction at 39 cm.  No HH.  Normal stomach and duodenum.  11 [place] empiric dilation - Colonoscopy (12/13/2018, Dr. Elton Sin): 3 mm sessile polyp in hepatic flexure (path: HP), sigmoid diverticulosis   Family history: - Father with colon polyps at age 56 and Pancreatic Cancer

## 2021-07-08 NOTE — Telephone Encounter (Signed)
  Follow up Call-  Call back number 07/04/2021  Post procedure Call Back phone  # [phone]  Permission to leave phone message Yes     Patient questions:  Do you have a fever, pain , or abdominal swelling? No. Pain Score  0 *  Have you tolerated food without any problems? Yes.    Have you been able to return to your normal activities? Yes.    Do you have any questions about your discharge instructions: Diet   No. Medications  No. Follow up visit  No.  Do you have questions or concerns about your Care? No.  Actions: * If pain score is 4 or above: No action needed, pain <4.

## 2021-07-08 NOTE — Telephone Encounter (Signed)
Per 07/04/21 procedure report - Return to GI office at appt to be scheduled  Patient has been scheduled for a follow up with Dr. Bryan Lemma on Tuesday, 08/05/21 at 9:20 am. Letter mailed and sent to patient via my chart with appt information.

## 2021-07-09 ENCOUNTER — Encounter: Payer: Self-pay | Admitting: Gastroenterology

## 2021-07-23 ENCOUNTER — Telehealth: Payer: Self-pay

## 2021-07-23 ENCOUNTER — Telehealth: Payer: Managed Care, Other (non HMO) | Admitting: Physician Assistant

## 2021-07-23 DIAGNOSIS — U071 COVID-19: Secondary | ICD-10-CM

## 2021-07-23 HISTORY — DX: COVID-19: U07.1

## 2021-07-23 MED ORDER — MOLNUPIRAVIR EUA 200MG CAPSULE: 4.0000 | ORAL_CAPSULE | Freq: Two times a day (BID) | ORAL | 0 refills | Status: AC

## 2021-07-23 MED ORDER — QVAR REDIHALER 80 MCG/ACT IN AERB
2.0000 | INHALATION_SPRAY | Freq: Two times a day (BID) | RESPIRATORY_TRACT | 0 refills | Status: DC
Start: 2021-07-23 — End: 2021-08-05

## 2021-07-23 MED ORDER — BENZONATATE 100 MG PO CAPS: 100.0000 mg | ORAL_CAPSULE | Freq: Three times a day (TID) | ORAL | 0 refills | Status: DC | PRN

## 2021-07-23 MED ORDER — ALBUTEROL SULFATE HFA 108 (90 BASE) MCG/ACT IN AERS: 2.0000 | INHALATION_SPRAY | Freq: Four times a day (QID) | RESPIRATORY_TRACT | 0 refills | Status: AC | PRN
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
## 2021-07-23 NOTE — Telephone Encounter (Signed)
Pt called stating that she tested positive this morning for COVID and is requesting RX for paxlovid.  Advised pt that she would need a virtual visit with one of our providers, however we do not have any available appts until 07/25/2021.  Advised pt that if she felt like she could not wait until Friday for an appt then she could either be seen at [facility] or do a MyChart visit with one of the online providers.  Pt expressed understanding and stated that she would seek a MyChart visit.  Charyl Bigger, CMA

## 2021-07-23 NOTE — Patient Instructions (Signed)
Christy Todd, thank you for joining Leeanne Rio, PA-C for today's virtual visit.  While this provider is not your primary care provider (PCP), if your PCP is located in our provider database this encounter information will be shared with them immediately following your visit.  Consent: (Patient) Christy Todd provided verbal consent for this virtual visit at the beginning of the encounter.  Current Medications:  Current Outpatient Medications:    ALBUTEROL SULFATE IN, Inhale into the lungs., Disp: , Rfl:    beclomethasone (QVAR) 80 MCG/ACT inhaler, Inhale 2 puffs into the lungs 2 (two) times daily. (Patient not taking: Reported on 07/04/2021), Disp: , Rfl:    Cholecalciferol (VITAMIN D3) 50 MCG (2000 UT) TABS, Take by mouth., Disp: , Rfl:    DULoxetine (CYMBALTA) 30 MG capsule, 2 BY MOUTH EVERY MORNING, 1 BY MOUTH EVERY AT NIGHT, Disp: 270 capsule, Rfl: 2   ibuprofen (ADVIL) 800 MG tablet, TAKE 1 TABLET BY MOUTH EVERY 8 HOURS AS NEEDED, Disp: 60 tablet, Rfl: 1   levothyroxine (SYNTHROID) 137 MCG tablet, TAKE 1 TABLET BY MOUTH EVERY DAY IN THE MORNING, Disp: 90 tablet, Rfl: 1   lisinopril (ZESTRIL) 20 MG tablet, Take 1 tablet (20 mg total) by mouth daily., Disp: 90 tablet, Rfl: 3   metoprolol succinate (TOPROL-XL) 50 MG 24 hr tablet, TAKE 1 TABLET BY MOUTH EVERY DAY, Disp: 90 tablet, Rfl: 1   montelukast (SINGULAIR) 10 MG tablet, TAKE 1 TABLET BY MOUTH EVERY DAY, Disp: 90 tablet, Rfl: 1   nortriptyline (PAMELOR) 25 MG capsule, Take 1 capsule (25 mg total) by mouth at bedtime as needed., Disp: 90 capsule, Rfl: 3   omeprazole (PRILOSEC) 20 MG capsule, Take 20 mg by mouth daily., Disp: , Rfl:    spironolactone (ALDACTONE) 50 MG tablet, Take 1 tablet (50 mg total) by mouth daily., Disp: 90 tablet, Rfl: 1   WEGOVY 2.4 MG/0.75ML SOAJ, Inject 2.4 mg into the skin once a week., Disp: 9 mL, Rfl: 3   Medications ordered in this encounter:  No orders of the defined types were placed in this  encounter.    *If you need refills on other medications prior to your next appointment, please contact your pharmacy*  Follow-Up: Call back or seek an in-person evaluation if the symptoms worsen or if the condition fails to improve as anticipated.  Other Instructions Please keep well-hydrated and get plenty of rest. Start a saline nasal rinse to flush out your nasal passages. You can use plain Mucinex to help thin congestion. If you have a humidifier, running in the bedroom at night. I want you to start OTC vitamin D3 1000 units daily, vitamin C 1000 mg daily, and a zinc supplement. Please take prescribed medications as directed.  You have been enrolled in a MyChart symptom monitoring program. Please answer these questions daily so we can keep track of how you are doing.  You were to quarantine for 5 days from onset of your symptoms.  After day 5, if you have had no fever and you are feeling better, you can end quarantine but need to mask for an additional 5 days. After day 5 if you have a fever or are having significant symptoms, please quarantine for full 10 days.  If you note any worsening of symptoms, any significant shortness of breath or any chest pain, please seek ER evaluation ASAP.  Please do not delay care!  COVID-19: What to Do if You Are Sick If you test positive and are an older  adult or someone who is at high risk of getting very sick from COVID-19, treatment may be available. Contact a healthcare provider right away after a positive test to determine if you are eligible, even if your symptoms are mild right now. You can also visit a Test to Treat location and, if eligible, receive a prescription from a provider. Don't delay: Treatment must be started within the first few days to be effective. If you have a fever, cough, or other symptoms, you might have COVID-19. Most people have mild illness and are able to recover at home. If you are sick: Keep track of your symptoms. If  you have an emergency warning sign (including trouble breathing), call 911. Steps to help prevent the spread of COVID-19 if you are sick If you are sick with COVID-19 or think you might have COVID-19, follow the steps below to care for yourself and to help protect other people in your home and community. Stay home except to get medical care Stay home. Most people with COVID-19 have mild illness and can recover at home without medical care. Do not leave your home, except to get medical care. Do not visit public areas and do not go to places where you are unable to wear a mask. Take care of yourself. Get rest and stay hydrated. Take over-the-counter medicines, such as acetaminophen, to help you feel better. Stay in touch with your doctor. Call before you get medical care. Be sure to get care if you have trouble breathing, or have any other emergency warning signs, or if you think it is an emergency. Avoid public transportation, ride-sharing, or taxis if possible. Get tested If you have symptoms of COVID-19, get tested. While waiting for test results, stay away from others, including staying apart from those living in your household. Get tested as soon as possible after your symptoms start. Treatments may be available for people with COVID-19 who are at risk for becoming very sick. Don't delay: Treatment must be started early to be effective--some treatments must begin within 5 days of your first symptoms. Contact your healthcare provider right away if your test result is positive to determine if you are eligible. Self-tests are one of several options for testing for the virus that causes COVID-19 and may be more convenient than laboratory-based tests and point-of-care tests. Ask your healthcare provider or your local health department if you need help interpreting your test results. You can visit your state, tribal, local, and territorial health department's website to look for the latest local information  on testing sites. Separate yourself from other people As much as possible, stay in a specific room and away from other people and pets in your home. If possible, you should use a separate bathroom. If you need to be around other people or animals in or outside of the home, wear a well-fitting mask. Tell your close contacts that they may have been exposed to COVID-19. An infected person can spread COVID-19 starting 48 hours (or 2 days) before the person has any symptoms or tests positive. By letting your close contacts know they may have been exposed to COVID-19, you are helping to protect everyone. See COVID-19 and Animals if you have questions about pets. If you are diagnosed with COVID-19, someone from the health department may call you. Answer the call to slow the spread. Monitor your symptoms Symptoms of COVID-19 include fever, cough, or other symptoms. Follow care instructions from your healthcare provider and local health department. Your local health  authorities may give instructions on checking your symptoms and reporting information. When to seek emergency medical attention Look for emergency warning signs* for COVID-19. If someone is showing any of these signs, seek emergency medical care immediately: Trouble breathing Persistent pain or pressure in the chest New confusion Inability to wake or stay awake Pale, gray, or blue-colored skin, lips, or nail beds, depending on skin tone *This list is not all possible symptoms. Please call your medical provider for any other symptoms that are severe or concerning to you. Call 911 or call ahead to your local emergency facility: Notify the operator that you are seeking care for someone who has or may have COVID-19. Call ahead before visiting your doctor Call ahead. Many medical visits for routine care are being postponed or done by phone or telemedicine. If you have a medical appointment that cannot be postponed, call your doctor's office, and  tell them you have or may have COVID-19. This will help the office protect themselves and other patients. If you are sick, wear a well-fitting mask You should wear a mask if you must be around other people or animals, including pets (even at home). Wear a mask with the best fit, protection, and comfort for you. You don't need to wear the mask if you are alone. If you can't put on a mask (because of trouble breathing, for example), cover your coughs and sneezes in some other way. Try to stay at least 6 feet away from other people. This will help protect the people around you. Masks should not be placed on young children under age 27 years, anyone who has trouble breathing, or anyone who is not able to remove the mask without help. Cover your coughs and sneezes Cover your mouth and nose with a tissue when you cough or sneeze. Throw away used tissues in a lined trash can. Immediately wash your hands with soap and water for at least 20 seconds. If soap and water are not available, clean your hands with an alcohol-based hand sanitizer that contains at least 60% alcohol. Clean your hands often Wash your hands often with soap and water for at least 20 seconds. This is especially important after blowing your nose, coughing, or sneezing; going to the bathroom; and before eating or preparing food. Use hand sanitizer if soap and water are not available. Use an alcohol-based hand sanitizer with at least 60% alcohol, covering all surfaces of your hands and rubbing them together until they feel dry. Soap and water are the best option, especially if hands are visibly dirty. Avoid touching your eyes, nose, and mouth with unwashed hands. Handwashing Tips Avoid sharing personal household items Do not share dishes, drinking glasses, cups, eating utensils, towels, or bedding with other people in your home. Wash these items thoroughly after using them with soap and water or put in the dishwasher. Clean surfaces in your  home regularly Clean and disinfect high-touch surfaces (for example, doorknobs, tables, handles, light switches, and countertops) in your "sick room" and bathroom. In shared spaces, you should clean and disinfect surfaces and items after each use by the person who is ill. If you are sick and cannot clean, a caregiver or other person should only clean and disinfect the area around you (such as your bedroom and bathroom) on an as needed basis. Your caregiver/other person should wait as long as possible (at least several hours) and wear a mask before entering, cleaning, and disinfecting shared spaces that you use. Clean and disinfect areas  that may have blood, stool, or body fluids on them. Use household cleaners and disinfectants. Clean visible dirty surfaces with household cleaners containing soap or detergent. Then, use a household disinfectant. Use a product from [employer] List N: Disinfectants for Coronavirus (WTUUE-28). Be sure to follow the instructions on the label to ensure safe and effective use of the product. Many products recommend keeping the surface wet with a disinfectant for a certain period of time (look at "contact time" on the product label). You may also need to wear personal protective equipment, such as gloves, depending on the directions on the product label. Immediately after disinfecting, wash your hands with soap and water for 20 seconds. For completed guidance on cleaning and disinfecting your home, visit Complete Disinfection Guidance. Take steps to improve ventilation at home Improve ventilation (air flow) at home to help prevent from spreading COVID-19 to other people in your household. Clear out COVID-19 virus particles in the air by opening windows, using air filters, and turning on fans in your home. Use this interactive tool to learn how to improve air flow in your home. When you can be around others after being sick with COVID-19 Deciding when you can be around others is  different for different situations. Find out when you can safely end home isolation. For any additional questions about your care, contact your healthcare provider or state or local health department. 01/21/2021 Content source: [facility] ([facility]), Division of Viral Diseases This information is not intended to replace advice given to you by your health care provider. Make sure you discuss any questions you have with your health care provider. Document Revised: 03/06/2021 Document Reviewed: 03/06/2021 Elsevier Patient Education  2022 [employer].      If you have been instructed to have an in-person evaluation today at a local Urgent Care facility, please use the link below. It will take you to a list of all of our available [facility], including address, phone number and hours of operation. Please do not delay care.  [facility]  If you or a family member do not have a primary care provider, use the link below to schedule a visit and establish care. When you choose a [facility] primary care physician or advanced practice provider, you gain a long-term partner in health. Find a Primary Care Provider  Learn more about [facility] in-office and virtual care options: [facility]

## 2021-07-25 ENCOUNTER — Other Ambulatory Visit: Payer: Self-pay | Admitting: Family Medicine

## 2021-07-31 ENCOUNTER — Encounter: Payer: Self-pay | Admitting: Family Medicine

## 2021-07-31 MED ORDER — TRAMADOL HCL 50 MG PO TABS: 50.0000 mg | ORAL_TABLET | Freq: Three times a day (TID) | ORAL | 0 refills | Status: AC | PRN

## 2021-08-05 ENCOUNTER — Other Ambulatory Visit: Payer: Self-pay

## 2021-08-05 ENCOUNTER — Encounter: Payer: Self-pay | Admitting: Gastroenterology

## 2021-08-05 ENCOUNTER — Ambulatory Visit (INDEPENDENT_AMBULATORY_CARE_PROVIDER_SITE_OTHER): Payer: Managed Care, Other (non HMO) | Admitting: Gastroenterology

## 2021-08-05 VITALS — BP 112/74 | HR 88 | Ht 66.0 in | Wt 221.2 lb

## 2021-08-05 DIAGNOSIS — Z86010 Personal history of colonic polyps: Secondary | ICD-10-CM | POA: Diagnosis not present

## 2021-08-05 DIAGNOSIS — K219 Gastro-esophageal reflux disease without esophagitis: Secondary | ICD-10-CM | POA: Diagnosis not present

## 2021-08-05 DIAGNOSIS — K222 Esophageal obstruction: Secondary | ICD-10-CM | POA: Diagnosis not present

## 2021-08-05 DIAGNOSIS — R1310 Dysphagia, unspecified: Secondary | ICD-10-CM

## 2021-08-05 NOTE — Progress Notes (Signed)
Chief Complaint:    Dysphagia, procedure follow-up  GI History: 60 y.o. female with a history of HTN, obesity (BMI 37), hypothyroidism, asthma, osteoarthritis of the right hip, cholecystectomy, hysterectomy, cystocele/rectocele repair who follows in the GI clinic for the following:  1) GERD: Longstanding history of reflux x15+ years. Index sxs of HB, regurgitation. +nocturnal sxs.  Relatively controlled with Prilosec 20 mg/day for many years.  2) Dysphagia: Solid food dysphagia x10+ years, worsening in 2022. Points to suprasternal notch. Has modified her diet, cuts food into small pieces. No issue with liquids. No food impactions. - 07/2021: EGD with 17 mm TTS dilation of proximal stricture  3) Abdominal bloating: Reports history of Celiac Disease diagnosed by her chiropractor years ago, and has since maintained gluten-free diet.  Unsure if serologies or duodenal biopsies were done prior to GFD, but GI symptoms (bloating) well controlled with diet.  Endoscopic History: - EGD (2014, Dr. Patrice Paradise): 3 cm HH, otherwise normal.  Biopsies notable for reflux esophagitis - Colonoscopy (2014, Dr. Patrice Paradise): Normal.  Recommended repeat in 5 years due to family history of colon polyps - EGD (12/09/2018, Dr. Elton Sin): Normal esophagus.  Normal GE junction at 39 cm.  No HH.  Normal stomach and duodenum.  3 [place] empiric dilation - Colonoscopy (12/13/2018, Dr. Elton Sin): 3 mm sessile polyp in hepatic flexure (path: HP), sigmoid diverticulosis - EGD (07/2021, Dr. Bryan Lemma): Stenosis at 20 mm dilated 17 mm balloon, esophageal biopsies negative for EOE, Hill grade 2 valve with normal Z-line, mild gastritis, normal duodenum (path normal) - Colonoscopy (07/2021, Dr. Bryan Lemma): 4 mm sigmoid hyperplastic polyp, sigmoid diverticulosis, grade 3-4 hemorrhoids, normal TI.  Repeat in 10 years   Family history: - Father with colon polyps at age 27 and Pancreatic Cancer  HPI:     Patient is a 60 y.o. female  presenting to the Gastroenterology Clinic for follow-up after EGD/colonoscopy last month as outlined above.  Dysphagia improved, but not completely resolved with the above EGD with 17 mm TTS dilation of the proximal stenosis.  Still cutting food into small pieces, taking smaller bites, and chewing thoroughly with meals. Requesting repeat EGD with additional esophageal dilation.   Has right hip replacement scheduled 08/25/2021.   She otherwise feels well and no active GI issues.  Review of systems:     No chest pain, no SOB, no fevers, no urinary sx   Past Medical History:  Diagnosis Date   Arthritis    Asthma    Hypertension    Hypothyroid    Sleep apnea    on CPAP   Wears hearing aid in right ear     Patient's surgical history, family medical history, social history, medications and allergies were all reviewed in Epic    Current Outpatient Medications  Medication Sig Dispense Refill   albuterol (VENTOLIN HFA) 108 (90 Base) MCG/ACT inhaler Inhale 2 puffs into the lungs every 6 (six) hours as needed for wheezing or shortness of breath. 8 g 0   Cholecalciferol (VITAMIN D3) 50 MCG (2000 UT) TABS Take by mouth.     DULoxetine (CYMBALTA) 30 MG capsule 2 BY MOUTH EVERY MORNING, 1 BY MOUTH EVERY AT NIGHT 270 capsule 2   ibuprofen (ADVIL) 800 MG tablet TAKE 1 TABLET BY MOUTH EVERY 8 HOURS AS NEEDED 60 tablet 1   levothyroxine (SYNTHROID) 137 MCG tablet TAKE 1 TABLET BY MOUTH EVERY DAY IN THE MORNING 90 tablet 1   lisinopril (ZESTRIL) 20 MG tablet Take 1 tablet (20 mg  total) by mouth daily. 90 tablet 3   metoprolol succinate (TOPROL-XL) 50 MG 24 hr tablet TAKE 1 TABLET BY MOUTH EVERY DAY 90 tablet 1   montelukast (SINGULAIR) 10 MG tablet TAKE 1 TABLET BY MOUTH EVERY DAY 90 tablet 1   nortriptyline (PAMELOR) 25 MG capsule Take 1 capsule (25 mg total) by mouth at bedtime as needed. 90 capsule 3   omeprazole (PRILOSEC) 20 MG capsule Take 20 mg by mouth daily.     traMADol (ULTRAM) 50 MG  tablet Take 1 tablet (50 mg total) by mouth every 8 (eight) hours as needed for up to 5 days. 15 tablet 0   WEGOVY 2.4 MG/0.75ML SOAJ Inject 2.4 mg into the skin once a week. 9 mL 3   spironolactone (ALDACTONE) 50 MG tablet Take 1 tablet (50 mg total) by mouth daily. 90 tablet 1   No current facility-administered medications for this visit.    Physical Exam:     BP 112/74   Pulse 88   Ht 5\' 6"  (1.676 m)   Wt 221 lb 4 oz (100.4 kg)   LMP 11/02/1993   SpO2 98%   BMI 35.71 kg/m   GENERAL:  Pleasant female in NAD PSYCH: : Cooperative, normal affect NEURO: Alert and oriented x 3, no focal neurologic deficits   IMPRESSION and PLAN:    1) Dysphagia 2) Esophageal stenosis - Plan for repeat EGD with repeat esophageal dilation.  Patient will call to schedule to be done sometime after her upcoming hip replacement - Continue cutting food into small pieces, chewing thoroughly, drink plenty of fluids with meals - Esophageal biopsies otherwise negative for EOE - If ongoing dysphagia despite repeat esophageal dilation, briefly discussed sending for Esophageal Manometry  3) GERD - Well-controlled with omeprazole 20 mg/day - Resume antireflux lifestyle/dietary modifications  4) History of colon hyperplastic polyps - Repeat colonoscopy in 2032 for ongoing screening  The indications, risks, and benefits of EGD were explained to the patient in detail. Risks include but are not limited to bleeding, perforation, adverse reaction to medications, and cardiopulmonary compromise. Sequelae include but are not limited to the possibility of surgery, hospitalization, and mortality. The patient verbalized understanding and wished to proceed. All questions answered, referred to scheduler. Further recommendations pending results of the exam.    Lavena Bullion ,DO, FACG 08/05/2021, 9:47 AM

## 2021-08-05 NOTE — Addendum Note (Signed)
Addended by: Lavena Bullion on: 08/05/2021 10:19 AM   Modules accepted: Level of Service

## 2021-08-05 NOTE — Patient Instructions (Signed)
If you are age 59 or older, your body mass index should be between 23-30. Your Body mass index is 35.71 kg/m. If this is out of the aforementioned range listed, please consider follow up with your Primary Care Provider.  If you are age 40 or younger, your body mass index should be between 19-25. Your Body mass index is 35.71 kg/m. If this is out of the aformentioned range listed, please consider follow up with your Primary Care Provider.   __________________________________________________________  The [facility] providers would like to encourage you to use [facility] to communicate with providers for non-urgent requests or questions.  Due to long hold times on the telephone, sending your provider a message by [facility] may be a faster and more efficient way to get a response.  Please allow 48 business hours for a response.  Please remember that this is for non-urgent requests.  ____________________________________________________  It has been recommended to you by your physician that you have a(n) EGD completed. Per your request, we did not schedule the procedure(s) today due to your up coming Hip surgery.Please contact our office at [phone] should you decide to have the procedure completed.   Due to recent changes in healthcare laws, you may see the results of your imaging and laboratory studies on MyChart before your provider has had a chance to review them.  We understand that in some cases there may be results that are confusing or concerning to you. Not all laboratory results come back in the same time frame and the provider may be waiting for multiple results in order to interpret others.  Please give [place] 48 hours in order for your provider to thoroughly review all the results before contacting the office for clarification of your results.  Thank you for choosing me and [facility].  Vito Cirigliano, D.O.

## 2021-08-18 ENCOUNTER — Other Ambulatory Visit: Payer: Self-pay | Admitting: Physician Assistant

## 2021-08-18 MED ORDER — METHOCARBAMOL 500 MG PO TABS: 500.0000 mg | ORAL_TABLET | Freq: Two times a day (BID) | ORAL | 2 refills | Status: DC | PRN

## 2021-08-18 MED ORDER — DOCUSATE SODIUM 100 MG PO CAPS: 100.0000 mg | ORAL_CAPSULE | Freq: Every day | ORAL | 2 refills | Status: DC | PRN

## 2021-08-18 MED ORDER — OXYCODONE-ACETAMINOPHEN 5-325 MG PO TABS: 1.0000 | ORAL_TABLET | Freq: Four times a day (QID) | ORAL | 0 refills | Status: DC | PRN

## 2021-08-18 MED ORDER — ONDANSETRON HCL 4 MG PO TABS: 4.0000 mg | ORAL_TABLET | Freq: Three times a day (TID) | ORAL | 0 refills | Status: DC | PRN

## 2021-08-18 MED ORDER — ASPIRIN EC 81 MG PO TBEC: 81.0000 mg | DELAYED_RELEASE_TABLET | Freq: Two times a day (BID) | ORAL | 0 refills | Status: DC

## 2021-08-19 ENCOUNTER — Encounter: Payer: Self-pay | Admitting: Orthopaedic Surgery

## 2021-08-19 ENCOUNTER — Other Ambulatory Visit: Payer: Self-pay | Admitting: Family Medicine

## 2021-08-21 NOTE — Progress Notes (Signed)
Surgical Instructions    Your procedure is scheduled on Monday 08/25/21.   Report to [facility] Main Entrance "A" at 08:05 A.M., then check in with the Admitting office.  Call this number if you have problems the morning of surgery:  [phone]   If you have any questions prior to your surgery date call [phone]: Open Monday-Friday 8am-4pm    Remember:  Do not eat after midnight the night before your surgery  You may drink clear liquids until 07:00 A.M. the morning of your surgery.   Clear liquids allowed are: Water, Non-Citrus Juices (without pulp), Carbonated Beverages, Clear Tea, Black Coffee ONLY (NO MILK, CREAM OR POWDERED CREAMER of any kind), and Gatorade    Take these medicines the morning of surgery with A SIP OF WATER   metoprolol succinate (TOPROL-XL)   omeprazole (PRILOSEC)   Take these medicines if needed:   albuterol (VENTOLIN HFA)   docusate sodium (COLACE)  DULoxetine (CYMBALTA)  ondansetron (ZOFRAN)   As of today, STOP taking any Aspirin (unless otherwise instructed by your surgeon) Aleve, Naproxen, Ibuprofen, Motrin, Advil, Goody's, BC's, all herbal medications, fish oil, and all vitamins.     After your COVID test   You are not required to quarantine however you are required to wear a well-fitting mask when you are out and around people not in your household.  If your mask becomes wet or soiled, replace with a new one.  Wash your hands often with soap and water for 20 seconds or clean your hands with an alcohol-based hand sanitizer that contains at least 60% alcohol.  Do not share personal items.  Notify your provider: if you are in close contact with someone who has COVID  or if you develop a fever of 100.4 or greater, sneezing, cough, sore throat, shortness of breath or body aches.             Do not wear jewelry or makeup Do not wear lotions, powders, perfumes/colognes, or deodorant. Do not shave 48 hours prior to surgery.  Men may shave  face and neck. Do not bring valuables to the hospital. DO Not wear nail polish, gel polish, artificial nails, or any other type of covering on natural nails including finger and toenails. If patients have artificial nails, gel coating, etc. that need to be removed by a nail salon, please have this removed prior to surgery or surgery may need to be canceled/delayed if the surgeon/ anesthesia feels like the patient is unable to be adequately monitored.             [facility] is not responsible for any belongings or valuables.  Do NOT Smoke (Tobacco/Vaping)  24 hours prior to your procedure  If you use a CPAP at night, you may bring your mask for your overnight stay.   Contacts, glasses, hearing aids, dentures or partials may not be worn into surgery, please bring cases for these belongings   For patients admitted to the hospital, discharge time will be determined by your treatment team.   Patients discharged the day of surgery will not be allowed to drive home, and someone needs to stay with them for 24 hours.  NO VISITORS WILL BE ALLOWED IN PRE-OP WHERE PATIENTS ARE PREPPED FOR SURGERY.  ONLY 1 SUPPORT PERSON MAY BE PRESENT IN THE WAITING ROOM WHILE YOU ARE IN SURGERY.  IF YOU ARE TO BE ADMITTED, ONCE YOU ARE IN YOUR ROOM YOU WILL BE ALLOWED TWO (2) VISITORS. 1 (ONE) VISITOR MAY  STAY OVERNIGHT BUT MUST ARRIVE TO THE ROOM BY 8pm.  Minor children may have two parents present. Special consideration for safety and communication needs will be reviewed on a case by case basis.  Special instructions:    Oral Hygiene is also important to reduce your risk of infection.  Remember - BRUSH YOUR TEETH THE MORNING OF SURGERY WITH YOUR REGULAR TOOTHPASTE   [facility]- Preparing For Surgery  Before surgery, you can play an important role. Because skin is not sterile, your skin needs to be as free of germs as possible. You can reduce the number of germs on your skin by washing with CHG (chlorahexidine  gluconate) Soap before surgery.  CHG is an antiseptic cleaner which kills germs and bonds with the skin to continue killing germs even after washing.     Please do not use if you have an allergy to CHG or antibacterial soaps. If your skin becomes reddened/irritated stop using the CHG.  Do not shave (including legs and underarms) for at least 48 hours prior to first CHG shower. It is OK to shave your face.  Please follow these instructions carefully.     Shower the NIGHT BEFORE SURGERY and the MORNING OF SURGERY with CHG Soap.   If you chose to wash your hair, wash your hair first as usual with your normal shampoo. After you shampoo, rinse your hair and body thoroughly to remove the shampoo.  Then [employer] and genitals (private parts) with your normal soap and rinse thoroughly to remove soap.  After that Use CHG Soap as you would any other liquid soap. You can apply CHG directly to the skin and wash gently with a scrungie or a clean washcloth.   Apply the CHG Soap to your body ONLY FROM THE NECK DOWN.  Do not use on open wounds or open sores. Avoid contact with your eyes, ears, mouth and genitals (private parts). Wash Face and genitals (private parts)  with your normal soap.   Wash thoroughly, paying special attention to the area where your surgery will be performed.  Thoroughly rinse your body with warm water from the neck down.  DO NOT shower/wash with your normal soap after using and rinsing off the CHG Soap.  Pat yourself dry with a CLEAN TOWEL.  Wear CLEAN PAJAMAS to bed the night before surgery  Place CLEAN SHEETS on your bed the night before your surgery  DO NOT SLEEP WITH PETS.   Day of Surgery:  Take a shower with CHG soap. Wear Clean/Comfortable clothing the morning of surgery Do not apply any deodorants/lotions.   Remember to brush your teeth WITH YOUR REGULAR TOOTHPASTE.   Please read over the following fact sheets that you were given.

## 2021-08-22 ENCOUNTER — Encounter (HOSPITAL_COMMUNITY)
Admission: RE | Admit: 2021-08-22 | Discharge: 2021-08-22 | Disposition: A | Discharge status: Home / Self Care | Payer: Managed Care, Other (non HMO) | Source: Ambulatory Visit | Attending: Orthopaedic Surgery | Admitting: Orthopaedic Surgery

## 2021-08-22 ENCOUNTER — Other Ambulatory Visit: Payer: Self-pay

## 2021-08-22 ENCOUNTER — Encounter: Payer: Self-pay | Admitting: Family Medicine

## 2021-08-22 ENCOUNTER — Encounter (HOSPITAL_COMMUNITY): Payer: Self-pay

## 2021-08-22 VITALS — BP 118/91 | HR 95 | Temp 98.3°F | Resp 17 | Ht 66.0 in | Wt 223.6 lb

## 2021-08-22 DIAGNOSIS — K219 Gastro-esophageal reflux disease without esophagitis: Secondary | ICD-10-CM | POA: Diagnosis not present

## 2021-08-22 DIAGNOSIS — Z9989 Dependence on other enabling machines and devices: Secondary | ICD-10-CM | POA: Diagnosis not present

## 2021-08-22 DIAGNOSIS — R7303 Prediabetes: Secondary | ICD-10-CM | POA: Insufficient documentation

## 2021-08-22 DIAGNOSIS — M1611 Unilateral primary osteoarthritis, right hip: Secondary | ICD-10-CM | POA: Diagnosis not present

## 2021-08-22 DIAGNOSIS — Z8616 Personal history of COVID-19: Secondary | ICD-10-CM | POA: Diagnosis not present

## 2021-08-22 DIAGNOSIS — I10 Essential (primary) hypertension: Secondary | ICD-10-CM

## 2021-08-22 DIAGNOSIS — G4733 Obstructive sleep apnea (adult) (pediatric): Secondary | ICD-10-CM | POA: Diagnosis not present

## 2021-08-22 DIAGNOSIS — Z79899 Other long term (current) drug therapy: Secondary | ICD-10-CM | POA: Insufficient documentation

## 2021-08-22 DIAGNOSIS — G40909 Epilepsy, unspecified, not intractable, without status epilepticus: Secondary | ICD-10-CM | POA: Diagnosis not present

## 2021-08-22 DIAGNOSIS — Z7982 Long term (current) use of aspirin: Secondary | ICD-10-CM | POA: Diagnosis not present

## 2021-08-22 DIAGNOSIS — Z7951 Long term (current) use of inhaled steroids: Secondary | ICD-10-CM | POA: Insufficient documentation

## 2021-08-22 DIAGNOSIS — Z01818 Encounter for other preprocedural examination: Secondary | ICD-10-CM | POA: Insufficient documentation

## 2021-08-22 DIAGNOSIS — Z6836 Body mass index (BMI) 36.0-36.9, adult: Secondary | ICD-10-CM | POA: Insufficient documentation

## 2021-08-22 DIAGNOSIS — E662 Morbid (severe) obesity with alveolar hypoventilation: Secondary | ICD-10-CM

## 2021-08-22 DIAGNOSIS — Z87898 Personal history of other specified conditions: Secondary | ICD-10-CM

## 2021-08-22 DIAGNOSIS — G8929 Other chronic pain: Secondary | ICD-10-CM | POA: Diagnosis not present

## 2021-08-22 DIAGNOSIS — G4752 REM sleep behavior disorder: Secondary | ICD-10-CM

## 2021-08-22 DIAGNOSIS — M25572 Pain in left ankle and joints of left foot: Secondary | ICD-10-CM | POA: Insufficient documentation

## 2021-08-22 DIAGNOSIS — E039 Hypothyroidism, unspecified: Secondary | ICD-10-CM | POA: Diagnosis not present

## 2021-08-22 DIAGNOSIS — J45909 Unspecified asthma, uncomplicated: Secondary | ICD-10-CM | POA: Diagnosis not present

## 2021-08-22 DIAGNOSIS — Z7901 Long term (current) use of anticoagulants: Secondary | ICD-10-CM | POA: Diagnosis not present

## 2021-08-22 HISTORY — DX: Depression, unspecified: F32.A

## 2021-08-22 HISTORY — DX: Gastro-esophageal reflux disease without esophagitis: K21.9

## 2021-08-22 HISTORY — DX: Prediabetes: R73.03

## 2021-08-22 LAB — CBC WITH DIFFERENTIAL/PLATELET
Abs Immature Granulocytes: 0.04 10*3/uL (ref 0.00–0.07)
Basophils Absolute: 0.1 10*3/uL (ref 0.0–0.1)
Basophils Relative: 1 %
Eosinophils Absolute: 0.4 10*3/uL (ref 0.0–0.5)
Eosinophils Relative: 6 %
HCT: 41.1 % (ref 36.0–46.0)
Hemoglobin: 13 g/dL (ref 12.0–15.0)
Immature Granulocytes: 1 %
Lymphocytes Relative: 23 %
Lymphs Abs: 1.7 10*3/uL (ref 0.7–4.0)
MCH: 28.7 pg (ref 26.0–34.0)
MCHC: 31.6 g/dL (ref 30.0–36.0)
MCV: 90.7 fL (ref 80.0–100.0)
Monocytes Absolute: 0.5 10*3/uL (ref 0.1–1.0)
Monocytes Relative: 7 %
Neutro Abs: 4.6 10*3/uL (ref 1.7–7.7)
Neutrophils Relative %: 62 %
Platelets: 268 10*3/uL (ref 150–400)
RBC: 4.53 MIL/uL (ref 3.87–5.11)
RDW: 13 % (ref 11.5–15.5)
WBC: 7.3 10*3/uL (ref 4.0–10.5)
nRBC: 0 % (ref 0.0–0.2)

## 2021-08-22 LAB — COMPREHENSIVE METABOLIC PANEL
ALT: 21 U/L (ref 0–44)
AST: 19 U/L (ref 15–41)
Albumin: 3.5 g/dL (ref 3.5–5.0)
Alkaline Phosphatase: 77 U/L (ref 38–126)
Anion gap: 7 (ref 5–15)
BUN: 17 mg/dL (ref 6–20)
CO2: 29 mmol/L (ref 22–32)
Calcium: 9.3 mg/dL (ref 8.9–10.3)
Chloride: 104 mmol/L (ref 98–111)
Creatinine, Ser: 0.96 mg/dL (ref 0.44–1.00)
GFR, Estimated: >60 mL/min (ref >60)
Glucose, Bld: 87 mg/dL (ref 70–99)
Potassium: 3.7 mmol/L (ref 3.5–5.1)
Sodium: 140 mmol/L (ref 135–145)
Total Bilirubin: 0.7 mg/dL (ref 0.3–1.2)
Total Protein: 6.6 g/dL (ref 6.5–8.1)

## 2021-08-22 LAB — URINALYSIS, ROUTINE W REFLEX MICROSCOPIC
Bilirubin Urine: NEGATIVE
Glucose, UA: NEGATIVE mg/dL
Hgb urine dipstick: NEGATIVE
Ketones, ur: NEGATIVE mg/dL
Nitrite: NEGATIVE
Protein, ur: NEGATIVE mg/dL
Specific Gravity, Urine: 1.017 (ref 1.005–1.030)
pH: 5 (ref 5.0–8.0)

## 2021-08-22 LAB — PROTIME-INR
INR: 0.9 (ref 0.8–1.2)
Prothrombin Time: 12.3 seconds (ref 11.4–15.2)

## 2021-08-22 LAB — SURGICAL PCR SCREEN
MRSA, PCR: NEGATIVE
Staphylococcus aureus: NEGATIVE

## 2021-08-22 LAB — GLUCOSE, CAPILLARY: Glucose-Capillary: 86 mg/dL (ref 70–99)

## 2021-08-22 LAB — APTT: aPTT: 40 seconds — ABNORMAL HIGH (ref 24–36)

## 2021-08-22 MED ORDER — TRANEXAMIC ACID 1000 MG/10ML IV SOLN
2000.0000 mg | INTRAVENOUS | Status: DC
  Filled 2021-08-22: qty 20

## 2021-08-22 NOTE — Anesthesia Preprocedure Evaluation (Addendum)
Anesthesia Evaluation  Patient identified by MRN, date of birth, ID band Patient awake    Reviewed: Allergy & Precautions, NPO status , Patient's Chart, lab work & pertinent test results  History of Anesthesia Complications Negative for: history of anesthetic complications  Airway Mallampati: II  TM Distance: >3 FB Neck ROM: Full    Dental  (+) Chipped,    Pulmonary asthma , sleep apnea and Continuous Positive Airway Pressure Ventilation ,    Pulmonary exam normal        Cardiovascular hypertension, Pt. on medications Normal cardiovascular exam     Neuro/Psych Seizures -,  Depression    GI/Hepatic Neg liver ROS, GERD  Controlled,  Endo/Other  Hypothyroidism   Renal/GU negative Renal ROS  negative genitourinary   Musculoskeletal  (+) Arthritis ,   Abdominal   Peds  Hematology negative hematology ROS (+)   Anesthesia Other Findings Day of surgery medications reviewed with patient.  Reproductive/Obstetrics negative OB ROS                           Anesthesia Physical Anesthesia Plan  ASA: 2  Anesthesia Plan: Spinal   Post-op Pain Management:    Induction:   PONV Risk Score and Plan: 3 and Treatment may vary due to age or medical condition, Ondansetron, Propofol infusion, Dexamethasone and Midazolam  Airway Management Planned: Natural Airway and Simple Face Mask  Additional Equipment: None  Intra-op Plan:   Post-operative Plan:   Informed Consent: I have reviewed the patients History and Physical, chart, labs and discussed the procedure including the risks, benefits and alternatives for the proposed anesthesia with the patient or authorized representative who has indicated his/her understanding and acceptance.       Plan Discussed with: CRNA  Anesthesia Plan Comments: (PAT note written 08/22/2021 by Myra Gianotti, PA-C. History includes never smoker, HTN, hypothyroidism,  asthma, OSA (uses CPAP), GERD, prediabetes, COVID-19 (+home test 07/23/21, s/p molnupiravir). Preoperative labs showed PTT 40, normal PT/INR, CBC and CMET.. She is not on any blood thinners.  )      Anesthesia Quick Evaluation

## 2021-08-22 NOTE — Progress Notes (Signed)
Anesthesia Chart Review:  Case: [id number] Date/Time: 08/25/21 0951   Procedure: RIGHT TOTAL HIP ARTHROPLASTY ANTERIOR APPROACH (Right: Hip) - 3-C   Anesthesia type: Spinal   Pre-op diagnosis: right hip degenerative joint disease   Location: [facility] OR ROOM 04 / [facility]   Surgeons: Leandrew Koyanagi, MD       DISCUSSION: Patient is a 60 year old female scheduled for the above procedure.  History includes never smoker, HTN, hypothyroidism, asthma, OSA (uses CPAP), GERD, prediabetes, COVID-19 (+home test 07/23/21, s/p molnupiravir).  BMI is consistent with obesity.  There is documentation of her + COVID test with treatment in 07/23/21 virtual visit note by Raiford Noble, PA-C with [facility].   Anesthesia team to evaluate on the day of surgery.    VS: BP (!) 118/91   Pulse 95   Temp 36.8 C (Oral)   Resp 17   Ht 5\' 6"  (1.676 m)   Wt 101.4 kg   LMP 11/02/1993   SpO2 100%   BMI 36.09 kg/m   PROVIDERS: Luetta Nutting, DO is PCP  Gerrit Heck, DO is GI [place], Asencion Partridge, MD is neurologist   LABS: Preoperative labs noted. PTT 40 and UA with moderate leukocytes, negative nitrites communicated with surgeon by PAT RN. PT/INR normal. CBC and CMET normal. She is not on any blood thinners.  (all labs ordered are listed, but only abnormal results are displayed)  Labs Reviewed  APTT - Abnormal; Notable for the following components:      Result Value   aPTT 40 (*)    All other components within normal limits  URINALYSIS, ROUTINE W REFLEX MICROSCOPIC - Abnormal; Notable for the following components:   Leukocytes,Ua MODERATE (*)    Bacteria, UA RARE (*)    All other components within normal limits  SURGICAL PCR SCREEN  CBC WITH DIFFERENTIAL/PLATELET  COMPREHENSIVE METABOLIC PANEL  PROTIME-INR  GLUCOSE, CAPILLARY     IMAGES: MRI Right Hip 05/31/21: IMPRESSION: 1. Moderate right and mild left hip joint effusions. 2. Moderate osteoarthritis of both hips. 3. Degenerative disc disease at  L5-S1. 4. Suspected small linear tear of the right anterior superior labrum. 5. Trace accentuated signal between the left pubis and left adductor aponeurosis reason the polyp possibility of a small adductor aponeurosis tear.    EKG: 08/22/21: NSR   CV: N/A  Past Medical History:  Diagnosis Date   Arthritis    Asthma    Depression    GERD (gastroesophageal reflux disease)    Hypertension    Hypothyroid    Pre-diabetes    Sleep apnea    on CPAP   Wears hearing aid in right ear     Past Surgical History:  Procedure Laterality Date   ABDOMINAL HYSTERECTOMY     ANKLE RECONSTRUCTION     CHOLECYSTECTOMY     COLONOSCOPY  12/09/2018   Dr. Delfino [facility].  3 mm sessile polyp, hepatic flexure. Mild - Moderate diverticulosis, sigmoid colon.   CYSTOCELE REPAIR     ESOPHAGOGASTRODUODENOSCOPY  12/09/2018   Dr. Delfino [facility]. Normal upper GI tract   RECTOCELE REPAIR     TENDON REPAIR      MEDICATIONS:  albuterol (VENTOLIN HFA) 108 (90 Base) MCG/ACT inhaler   aspirin EC 81 MG tablet   Cholecalciferol (VITAMIN D3) 50 MCG (2000 UT) TABS   docusate sodium (COLACE) 100 MG capsule   DULoxetine (CYMBALTA) 30 MG capsule   ibuprofen (ADVIL) 800 MG tablet   levothyroxine (SYNTHROID) 137  MCG tablet   lisinopril (ZESTRIL) 20 MG tablet   methocarbamol (ROBAXIN) 500 MG tablet   metoprolol succinate (TOPROL-XL) 50 MG 24 hr tablet   montelukast (SINGULAIR) 10 MG tablet   nortriptyline (PAMELOR) 25 MG capsule   omeprazole (PRILOSEC) 20 MG capsule   ondansetron (ZOFRAN) 4 MG tablet   oxyCODONE-acetaminophen (PERCOCET) 5-325 MG tablet   spironolactone (ALDACTONE) 50 MG tablet   WEGOVY 2.4 MG/0.75ML SOAJ   No current facility-administered medications for this encounter.    [START ON 08/25/2021] tranexamic acid (CYKLOKAPRON) 2,000 mg in sodium chloride 0.9 % 50 mL Topical Application    Myra Gianotti, PA-C Surgical Short  Stay/Anesthesiology [facility] Phone [phone] [facility] Phone [phone] 08/22/2021 5:08 PM

## 2021-08-22 NOTE — Progress Notes (Signed)
Abnormal labs in PAT: Leukocytes, UA - moderate; Bacteria UA - moderate; APTT 40. Dr. Erlinda Hong was notified via [employer] and the surgery scheduler was informed that patient has abnormal labs.

## 2021-08-22 NOTE — Progress Notes (Addendum)
PCP - Luetta Nutting, DO Cardiologist - denies  PPM/ICD - denies Device Orders - n/a Rep Notified - n/a  Chest x-ray - n/a EKG - 08/22/2021 Stress Test - denies ECHO - denies Cardiac Cath - denies  Sleep Study - yes CPAP - yes - 16  Fasting Blood Sugar - n/a CBG today 86 - patient has pre-diabetes. Patient is not checking CBG at home.   Blood Thinner Instructions: n/a Aspirin Instructions: Aspirin -patient will start Aspirin after surgery.   Patient was instructed: As of today, STOP taking any Aspirin (unless otherwise instructed by your surgeon) Aleve, Naproxen, Ibuprofen, Motrin, Advil, Goody's, BC's, all herbal medications, fish oil, and all vitamins.  ERAS Protcol - yes PRE-SURGERY Ensure - yes  COVID TEST- patient verbalized that she was tested positive for COVID last month (September); the test was done home and her PCP prescribed to her an antiviral medicine. Patient was advised to bring the day of surgery a prove from her PCP that she had [place] in September. Patient verbalized understanding.   Anesthesia review: abnormal labs in PAT. Dr. Erlinda Hong was notified and [facility].  Patient denies shortness of breath, fever, cough and chest pain at PAT appointment   All instructions explained to the patient, with a verbal understanding of the material. Patient agrees to go over the instructions while at home for a better understanding. Patient also instructed to self quarantine after being tested for COVID-19. The opportunity to ask questions was provided.

## 2021-08-25 ENCOUNTER — Encounter (HOSPITAL_COMMUNITY)
Admission: RE | Disposition: A | Discharge status: Home / Self Care | Payer: Self-pay | Source: Home / Self Care | Attending: Orthopaedic Surgery

## 2021-08-25 ENCOUNTER — Ambulatory Visit (HOSPITAL_COMMUNITY): Payer: Managed Care, Other (non HMO) | Admitting: Anesthesiology

## 2021-08-25 ENCOUNTER — Other Ambulatory Visit: Payer: Self-pay

## 2021-08-25 ENCOUNTER — Ambulatory Visit (HOSPITAL_COMMUNITY): Payer: Managed Care, Other (non HMO) | Admitting: Vascular Surgery

## 2021-08-25 ENCOUNTER — Observation Stay (HOSPITAL_COMMUNITY)
Admission: RE | Admit: 2021-08-25 | Discharge: 2021-08-26 | Disposition: A | Discharge status: Home / Self Care | Payer: Managed Care, Other (non HMO) | Attending: Orthopaedic Surgery | Admitting: Orthopaedic Surgery

## 2021-08-25 ENCOUNTER — Observation Stay (HOSPITAL_COMMUNITY): Payer: Managed Care, Other (non HMO)

## 2021-08-25 ENCOUNTER — Other Ambulatory Visit: Payer: Self-pay | Admitting: Physician Assistant

## 2021-08-25 ENCOUNTER — Encounter (HOSPITAL_COMMUNITY): Payer: Self-pay | Admitting: Orthopaedic Surgery

## 2021-08-25 ENCOUNTER — Ambulatory Visit (HOSPITAL_COMMUNITY): Payer: Managed Care, Other (non HMO)

## 2021-08-25 DIAGNOSIS — M1611 Unilateral primary osteoarthritis, right hip: Principal | ICD-10-CM | POA: Insufficient documentation

## 2021-08-25 DIAGNOSIS — Z7982 Long term (current) use of aspirin: Secondary | ICD-10-CM | POA: Diagnosis not present

## 2021-08-25 DIAGNOSIS — Z96649 Presence of unspecified artificial hip joint: Secondary | ICD-10-CM

## 2021-08-25 DIAGNOSIS — R7303 Prediabetes: Secondary | ICD-10-CM | POA: Insufficient documentation

## 2021-08-25 DIAGNOSIS — E039 Hypothyroidism, unspecified: Secondary | ICD-10-CM | POA: Diagnosis not present

## 2021-08-25 DIAGNOSIS — Z8616 Personal history of COVID-19: Secondary | ICD-10-CM | POA: Diagnosis not present

## 2021-08-25 DIAGNOSIS — Z79899 Other long term (current) drug therapy: Secondary | ICD-10-CM | POA: Diagnosis not present

## 2021-08-25 DIAGNOSIS — I10 Essential (primary) hypertension: Secondary | ICD-10-CM | POA: Insufficient documentation

## 2021-08-25 DIAGNOSIS — J45909 Unspecified asthma, uncomplicated: Secondary | ICD-10-CM | POA: Diagnosis not present

## 2021-08-25 DIAGNOSIS — Z96641 Presence of right artificial hip joint: Secondary | ICD-10-CM

## 2021-08-25 DIAGNOSIS — Z419 Encounter for procedure for purposes other than remedying health state, unspecified: Secondary | ICD-10-CM

## 2021-08-25 HISTORY — PX: TOTAL HIP ARTHROPLASTY: SHX124

## 2021-08-25 LAB — GLUCOSE, CAPILLARY: Glucose-Capillary: 97 mg/dL (ref 70–99)

## 2021-08-25 IMAGING — RF DG HIP (WITH PELVIS) OPERATIVE*R*
1 series · 2 of 2 positions shown · non-contrast
Comparison: [DATE]

FLUOROSCOPY TIME:  26 seconds

CLINICAL DATA: Right hip total arthroplasty

EXAM:
OPERATIVE RIGHT HIP (WITH PELVIS IF PERFORMED) 1 VIEWS
TECHNIQUE: Fluoroscopic spot image(s) were submitted for interpretation
post-operatively.

[Series 1: unknown protocol · 0.20mm/px · 2 of 2 slices shown]
[im 1/2]
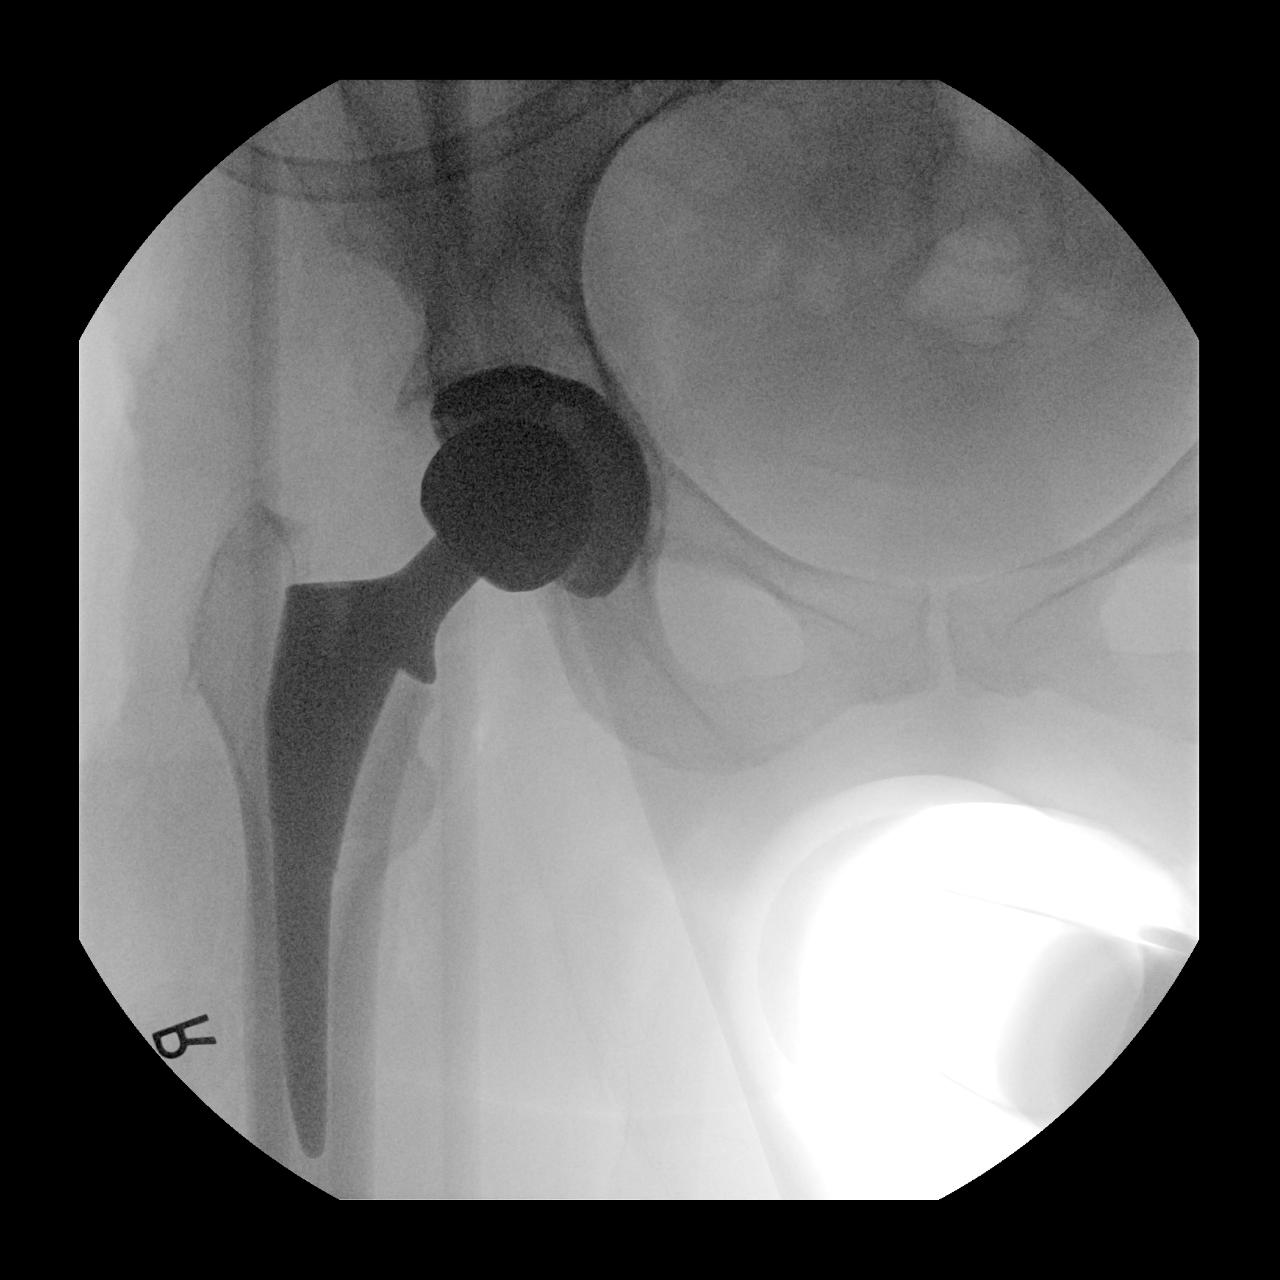
[im 2/2]
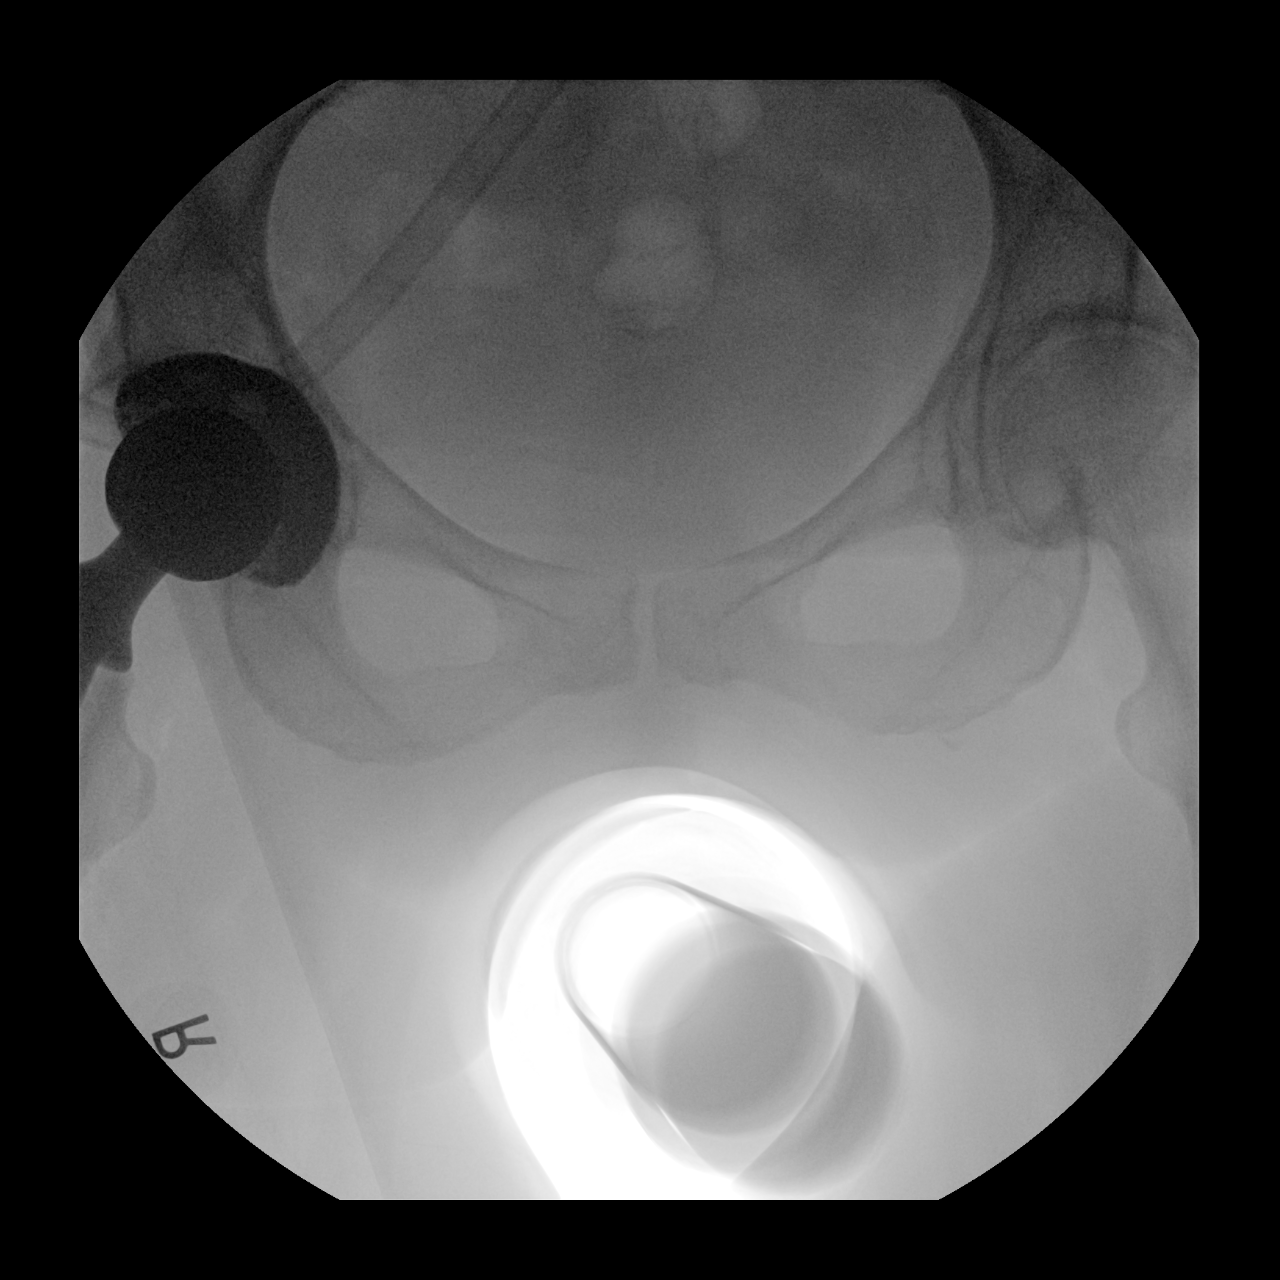

[2 of 2 positions shown; findings below may reference images not displayed]

FINDINGS: Intraoperative fluoroscopic images of the right hip demonstrate
total hip arthroplasty. No obvious perihardware fracture or
component malposition.
IMPRESSION: Intraoperative fluoroscopic images of the right hip demonstrate
total hip arthroplasty.

## 2021-08-25 IMAGING — DX DG PORTABLE PELVIS
1 series · 1 of 1 positions shown · non-contrast
Comparison: [DATE]

CLINICAL DATA: Right hip replacement

EXAM:
PORTABLE PELVIS 1-2 VIEWS

[pelvis]
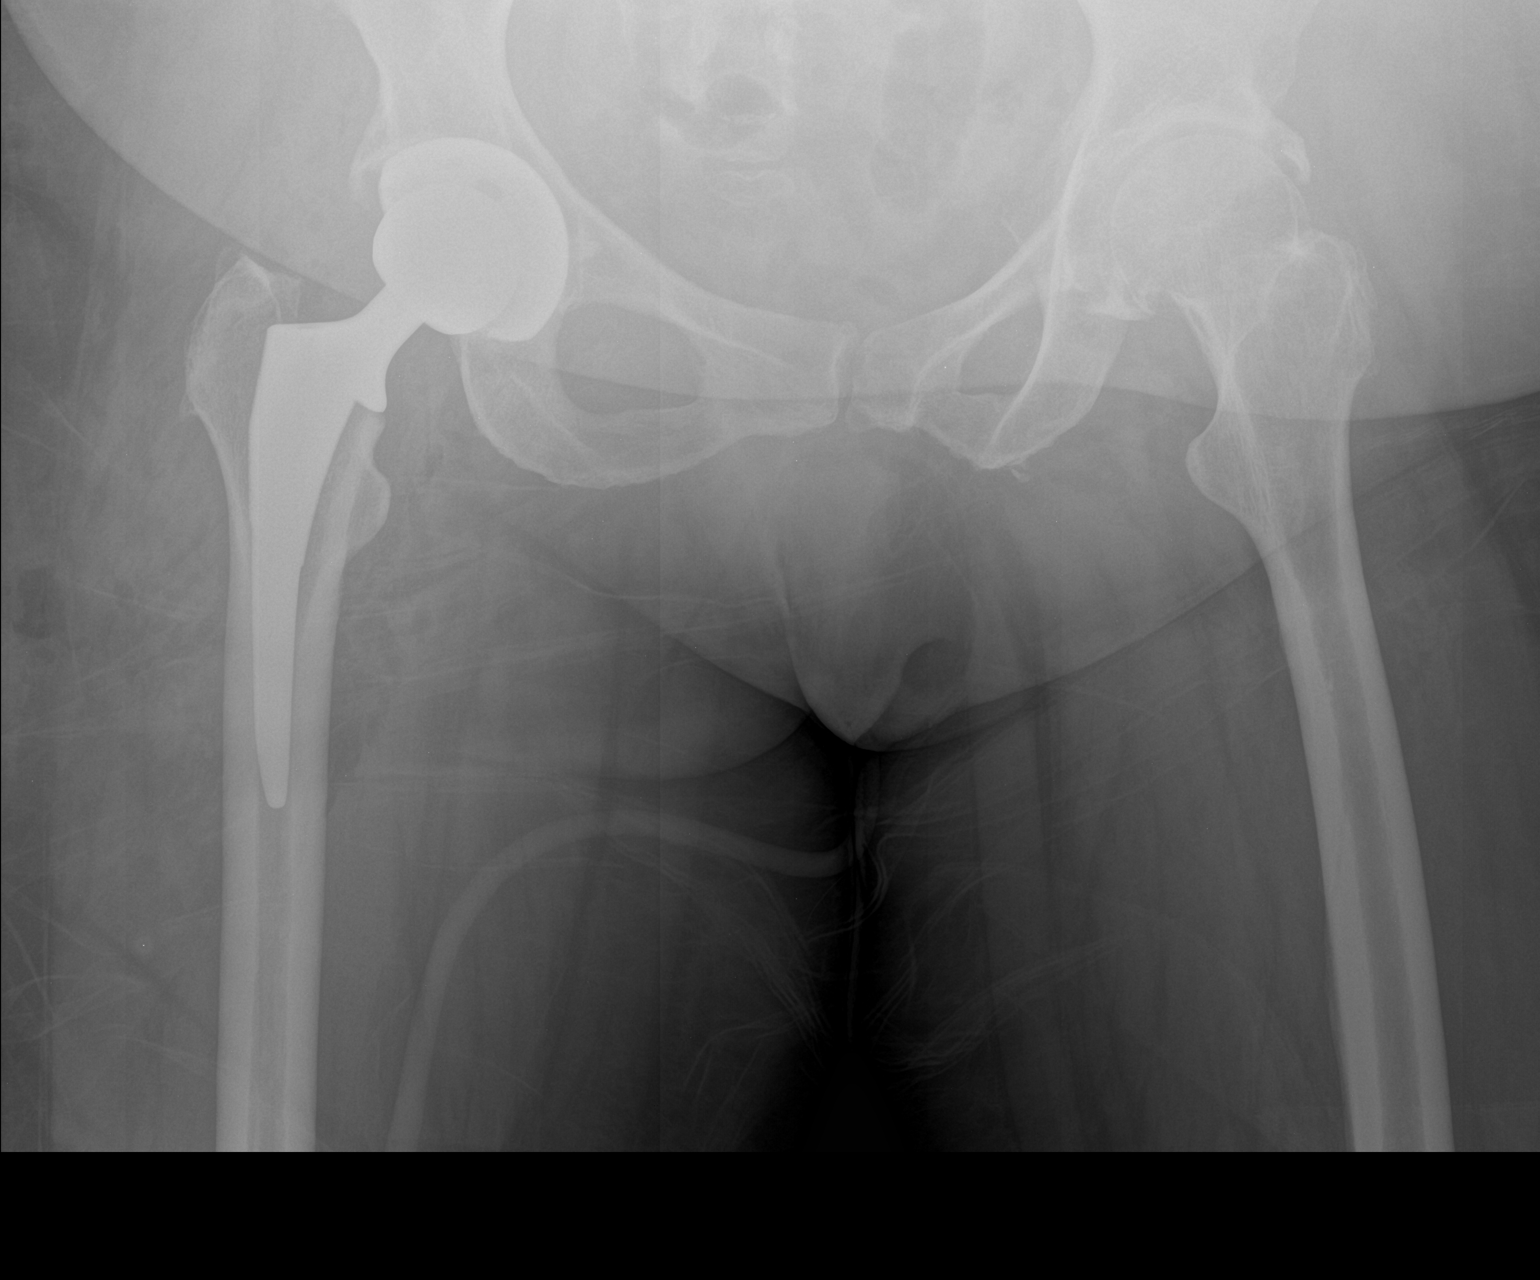

[1 of 1 positions shown; findings below may reference images not displayed]

FINDINGS: Interval postsurgical changes from right total hip arthroplasty.
Arthroplasty components are in their expected alignment. No
periprosthetic fracture or evidence of other complication. Expected
postoperative changes within the overlying soft tissues.
IMPRESSION: Satisfactory postoperative appearance following right total hip
arthroplasty.

## 2021-08-25 SURGERY — ARTHROPLASTY, HIP, TOTAL, ANTERIOR APPROACH
Anesthesia: Spinal | Site: Hip | Laterality: Right

## 2021-08-25 MED ORDER — METOPROLOL SUCCINATE ER 50 MG PO TB24
50.0000 mg | ORAL_TABLET | Freq: Every day | ORAL | Status: DC
  Filled 2021-08-25: qty 1

## 2021-08-25 MED ORDER — ONDANSETRON HCL 4 MG/2ML IJ SOLN
INTRAMUSCULAR | Status: DC | PRN
  Administered 2021-08-25: 4 mg via INTRAVENOUS

## 2021-08-25 MED ORDER — SODIUM CHLORIDE 0.9 % IR SOLN
Status: DC | PRN
  Administered 2021-08-25: 1000 mL

## 2021-08-25 MED ORDER — ONDANSETRON HCL 4 MG/2ML IJ SOLN
INTRAMUSCULAR | Status: AC
  Filled 2021-08-25: qty 2

## 2021-08-25 MED ORDER — DULOXETINE HCL 30 MG PO CPEP
30.0000 mg | ORAL_CAPSULE | Freq: Every day | ORAL | Status: DC
  Administered 2021-08-25: 30 mg via ORAL
  Filled 2021-08-25: qty 1

## 2021-08-25 MED ORDER — BUPIVACAINE-MELOXICAM ER 400-12 MG/14ML IJ SOLN
INTRAMUSCULAR | Status: DC | PRN
  Administered 2021-08-25: 400 mg

## 2021-08-25 MED ORDER — MIDAZOLAM HCL 5 MG/5ML IJ SOLN
INTRAMUSCULAR | Status: DC | PRN
  Administered 2021-08-25 (×2): 1 mg via INTRAVENOUS

## 2021-08-25 MED ORDER — ONDANSETRON HCL 4 MG PO TABS
4.0000 mg | ORAL_TABLET | Freq: Four times a day (QID) | ORAL | Status: DC | PRN
  Administered 2021-08-26: 4 mg via ORAL
  Filled 2021-08-25: qty 1

## 2021-08-25 MED ORDER — PHENYLEPHRINE 40 MCG/ML (10ML) SYRINGE FOR IV PUSH (FOR BLOOD PRESSURE SUPPORT)
PREFILLED_SYRINGE | INTRAVENOUS | Status: DC | PRN
  Administered 2021-08-25 (×4): 80 ug via INTRAVENOUS

## 2021-08-25 MED ORDER — ACETAMINOPHEN 325 MG PO TABS
325.0000 mg | ORAL_TABLET | Freq: Four times a day (QID) | ORAL | Status: DC | PRN
  Administered 2021-08-26: 650 mg via ORAL
  Filled 2021-08-25: qty 2

## 2021-08-25 MED ORDER — DEXAMETHASONE SODIUM PHOSPHATE 10 MG/ML IJ SOLN
10.0000 mg | Freq: Once | INTRAMUSCULAR | Status: AC
  Administered 2021-08-26: 10 mg via INTRAVENOUS

## 2021-08-25 MED ORDER — TRANEXAMIC ACID 1000 MG/10ML IV SOLN
INTRAVENOUS | Status: DC | PRN
  Administered 2021-08-25: 2000 mg via TOPICAL

## 2021-08-25 MED ORDER — POVIDONE-IODINE 10 % EX SWAB
2.0000 "application " | Freq: Once | CUTANEOUS | Status: AC
  Administered 2021-08-25: 2 via TOPICAL

## 2021-08-25 MED ORDER — ASPIRIN 81 MG PO CHEW
81.0000 mg | CHEWABLE_TABLET | Freq: Two times a day (BID) | ORAL | Status: DC
  Administered 2021-08-25 – 2021-08-26 (×2): 81 mg via ORAL
  Filled 2021-08-25 (×2): qty 1

## 2021-08-25 MED ORDER — DIPHENHYDRAMINE HCL 12.5 MG/5ML PO ELIX
25.0000 mg | ORAL_SOLUTION | ORAL | Status: DC | PRN
  Filled 2021-08-25: qty 10

## 2021-08-25 MED ORDER — DOCUSATE SODIUM 100 MG PO CAPS
100.0000 mg | ORAL_CAPSULE | Freq: Two times a day (BID) | ORAL | Status: DC
  Administered 2021-08-25 – 2021-08-26 (×2): 100 mg via ORAL
  Filled 2021-08-25 (×2): qty 1

## 2021-08-25 MED ORDER — METOCLOPRAMIDE HCL 5 MG/ML IJ SOLN: 5.0000 mg | Freq: Three times a day (TID) | INTRAMUSCULAR | Status: DC | PRN

## 2021-08-25 MED ORDER — NITROFURANTOIN MONOHYD MACRO 100 MG PO CAPS: 100.0000 mg | ORAL_CAPSULE | Freq: Four times a day (QID) | ORAL | 0 refills | Status: AC | PRN

## 2021-08-25 MED ORDER — LISINOPRIL 20 MG PO TABS
20.0000 mg | ORAL_TABLET | Freq: Every day | ORAL | Status: DC
  Administered 2021-08-25: 20 mg via ORAL
  Filled 2021-08-25: qty 1

## 2021-08-25 MED ORDER — FENTANYL CITRATE (PF) 100 MCG/2ML IJ SOLN
INTRAMUSCULAR | Status: DC | PRN
  Administered 2021-08-25 (×2): 50 ug via INTRAVENOUS

## 2021-08-25 MED ORDER — OXYCODONE HCL 5 MG PO TABS: 5.0000 mg | ORAL_TABLET | Freq: Once | ORAL | Status: DC | PRN

## 2021-08-25 MED ORDER — OXYCODONE HCL 5 MG PO TABS: 10.0000 mg | ORAL_TABLET | ORAL | Status: DC | PRN

## 2021-08-25 MED ORDER — LACTATED RINGERS IV SOLN: INTRAVENOUS | Status: DC

## 2021-08-25 MED ORDER — OXYCODONE HCL 5 MG PO TABS
5.0000 mg | ORAL_TABLET | ORAL | Status: DC | PRN
  Administered 2021-08-25 – 2021-08-26 (×2): 10 mg via ORAL
  Filled 2021-08-25 (×3): qty 2

## 2021-08-25 MED ORDER — SORBITOL 70 % SOLN: 30.0000 mL | Freq: Every day | Status: DC | PRN

## 2021-08-25 MED ORDER — POLYETHYLENE GLYCOL 3350 17 G PO PACK
17.0000 g | PACK | Freq: Every day | ORAL | Status: DC
  Administered 2021-08-26: 17 g via ORAL
  Filled 2021-08-25: qty 1

## 2021-08-25 MED ORDER — VANCOMYCIN HCL 1000 MG IV SOLR
INTRAVENOUS | Status: AC
  Filled 2021-08-25: qty 20

## 2021-08-25 MED ORDER — PANTOPRAZOLE SODIUM 40 MG PO TBEC
40.0000 mg | DELAYED_RELEASE_TABLET | Freq: Every day | ORAL | Status: DC
  Administered 2021-08-26: 40 mg via ORAL
  Filled 2021-08-25: qty 1

## 2021-08-25 MED ORDER — ACETAMINOPHEN 500 MG PO TABS
1000.0000 mg | ORAL_TABLET | Freq: Four times a day (QID) | ORAL | Status: AC
  Administered 2021-08-25 (×3): 1000 mg via ORAL
  Filled 2021-08-25 (×3): qty 2

## 2021-08-25 MED ORDER — METHOCARBAMOL 1000 MG/10ML IJ SOLN
500.0000 mg | Freq: Four times a day (QID) | INTRAVENOUS | Status: DC | PRN
  Filled 2021-08-25: qty 5

## 2021-08-25 MED ORDER — ACETAMINOPHEN 500 MG PO TABS
1000.0000 mg | ORAL_TABLET | Freq: Once | ORAL | Status: AC
  Administered 2021-08-25: 1000 mg via ORAL
  Filled 2021-08-25: qty 2

## 2021-08-25 MED ORDER — IRRISEPT - 450ML BOTTLE WITH 0.05% CHG IN STERILE WATER, USP 99.95% OPTIME
TOPICAL | Status: DC | PRN
  Administered 2021-08-25: 450 mL via TOPICAL

## 2021-08-25 MED ORDER — PROPOFOL 500 MG/50ML IV EMUL
INTRAVENOUS | Status: DC | PRN
  Administered 2021-08-25: 100 ug/kg/min via INTRAVENOUS

## 2021-08-25 MED ORDER — ONDANSETRON HCL 4 MG/2ML IJ SOLN
4.0000 mg | Freq: Four times a day (QID) | INTRAMUSCULAR | Status: DC | PRN
  Administered 2021-08-25: 4 mg via INTRAVENOUS
  Filled 2021-08-25: qty 2

## 2021-08-25 MED ORDER — METHOCARBAMOL 500 MG PO TABS
500.0000 mg | ORAL_TABLET | Freq: Four times a day (QID) | ORAL | Status: DC | PRN
  Administered 2021-08-25 – 2021-08-26 (×4): 500 mg via ORAL
  Filled 2021-08-25 (×4): qty 1

## 2021-08-25 MED ORDER — METOCLOPRAMIDE HCL 5 MG PO TABS: 5.0000 mg | ORAL_TABLET | Freq: Three times a day (TID) | ORAL | Status: DC | PRN

## 2021-08-25 MED ORDER — FENTANYL CITRATE (PF) 250 MCG/5ML IJ SOLN
INTRAMUSCULAR | Status: AC
  Filled 2021-08-25: qty 5

## 2021-08-25 MED ORDER — VANCOMYCIN HCL 1 G IV SOLR
INTRAVENOUS | Status: DC | PRN
  Administered 2021-08-25: 1000 mg

## 2021-08-25 MED ORDER — ALUM & MAG HYDROXIDE-SIMETH 200-200-20 MG/5ML PO SUSP: 30.0000 mL | ORAL | Status: DC | PRN

## 2021-08-25 MED ORDER — LEVOTHYROXINE SODIUM 137 MCG PO TABS
137.0000 ug | ORAL_TABLET | Freq: Every day | ORAL | Status: DC
  Administered 2021-08-25: 137 ug via ORAL
  Filled 2021-08-25 (×2): qty 1

## 2021-08-25 MED ORDER — SODIUM CHLORIDE 0.9 % IV SOLN: INTRAVENOUS | Status: DC

## 2021-08-25 MED ORDER — BUPIVACAINE IN DEXTROSE 0.75-8.25 % IT SOLN
INTRATHECAL | Status: DC | PRN
  Administered 2021-08-25: 1.6 mL via INTRATHECAL

## 2021-08-25 MED ORDER — MENTHOL 3 MG MT LOZG: 1.0000 | LOZENGE | OROMUCOSAL | Status: DC | PRN

## 2021-08-25 MED ORDER — PHENYLEPHRINE 40 MCG/ML (10ML) SYRINGE FOR IV PUSH (FOR BLOOD PRESSURE SUPPORT)
PREFILLED_SYRINGE | INTRAVENOUS | Status: AC
  Filled 2021-08-25: qty 10

## 2021-08-25 MED ORDER — CHLORHEXIDINE GLUCONATE 0.12 % MT SOLN
OROMUCOSAL | Status: AC
  Administered 2021-08-25: 15 mL
  Filled 2021-08-25: qty 15

## 2021-08-25 MED ORDER — TRANEXAMIC ACID-NACL 1000-0.7 MG/100ML-% IV SOLN
1000.0000 mg | INTRAVENOUS | Status: AC
  Administered 2021-08-25: 1000 mg via INTRAVENOUS
  Filled 2021-08-25: qty 100

## 2021-08-25 MED ORDER — PROMETHAZINE HCL 25 MG/ML IJ SOLN: 6.2500 mg | INTRAMUSCULAR | Status: DC | PRN

## 2021-08-25 MED ORDER — 0.9 % SODIUM CHLORIDE (POUR BTL) OPTIME
TOPICAL | Status: DC | PRN
  Administered 2021-08-25: 1000 mL

## 2021-08-25 MED ORDER — TRANEXAMIC ACID-NACL 1000-0.7 MG/100ML-% IV SOLN
1000.0000 mg | Freq: Once | INTRAVENOUS | Status: AC
  Administered 2021-08-25: 1000 mg via INTRAVENOUS
  Filled 2021-08-25: qty 100

## 2021-08-25 MED ORDER — CEFAZOLIN SODIUM-DEXTROSE 2-4 GM/100ML-% IV SOLN
2.0000 g | INTRAVENOUS | Status: AC
  Administered 2021-08-25: 2 g via INTRAVENOUS
  Filled 2021-08-25: qty 100

## 2021-08-25 MED ORDER — MIDAZOLAM HCL 2 MG/2ML IJ SOLN
INTRAMUSCULAR | Status: AC
  Filled 2021-08-25: qty 2

## 2021-08-25 MED ORDER — CEFAZOLIN SODIUM-DEXTROSE 2-4 GM/100ML-% IV SOLN
2.0000 g | Freq: Four times a day (QID) | INTRAVENOUS | Status: AC
  Administered 2021-08-25 (×2): 2 g via INTRAVENOUS
  Filled 2021-08-25 (×2): qty 100

## 2021-08-25 MED ORDER — OXYCODONE HCL 5 MG/5ML PO SOLN
5.0000 mg | Freq: Once | ORAL | Status: DC | PRN
Start: 2021-08-25 — End: 2021-08-25

## 2021-08-25 MED ORDER — PHENOL 1.4 % MT LIQD: 1.0000 | OROMUCOSAL | Status: DC | PRN

## 2021-08-25 MED ORDER — HYDROMORPHONE HCL 1 MG/ML IJ SOLN: 0.5000 mg | INTRAMUSCULAR | Status: DC | PRN

## 2021-08-25 MED ORDER — OXYCODONE HCL ER 10 MG PO T12A
10.0000 mg | EXTENDED_RELEASE_TABLET | Freq: Two times a day (BID) | ORAL | Status: DC
  Administered 2021-08-25 – 2021-08-26 (×3): 10 mg via ORAL
  Filled 2021-08-25 (×3): qty 1

## 2021-08-25 MED ORDER — FENTANYL CITRATE (PF) 100 MCG/2ML IJ SOLN: 25.0000 ug | INTRAMUSCULAR | Status: DC | PRN

## 2021-08-25 MED ORDER — BUPIVACAINE-MELOXICAM ER 400-12 MG/14ML IJ SOLN
INTRAMUSCULAR | Status: AC
  Filled 2021-08-25: qty 1

## 2021-08-25 SURGICAL SUPPLY — 61 items
ACETAB CUP W/GRIPTION 54 (Plate) ×2 IMPLANT
BAG COUNTER SPONGE SURGICOUNT (BAG) ×2 IMPLANT
BAG DECANTER FOR FLEXI CONT (MISCELLANEOUS) ×2 IMPLANT
CELLS DAT CNTRL 66122 CELL SVR (MISCELLANEOUS) IMPLANT
COOLER ICEMAN CLASSIC (MISCELLANEOUS) ×2 IMPLANT
COVER PERINEAL POST (MISCELLANEOUS) ×2 IMPLANT
COVER SURGICAL LIGHT HANDLE (MISCELLANEOUS) ×2 IMPLANT
CUP ACETAB W/GRIPTION 54 (Plate) ×1 IMPLANT
DERMABOND ADVANCED (GAUZE/BANDAGES/DRESSINGS) ×1
DERMABOND ADVANCED .7 DNX12 (GAUZE/BANDAGES/DRESSINGS) ×1 IMPLANT
DRAPE C-ARM 42X72 X-RAY (DRAPES) ×2 IMPLANT
DRAPE POUCH INSTRU U-SHP 10X18 (DRAPES) ×2 IMPLANT
DRAPE STERI IOBAN 125X83 (DRAPES) ×2 IMPLANT
DRAPE U-SHAPE 47X51 STRL (DRAPES) ×4 IMPLANT
DRSG AQUACEL AG ADV 3.5X10 (GAUZE/BANDAGES/DRESSINGS) ×2 IMPLANT
DURAPREP 26ML APPLICATOR (WOUND CARE) ×4 IMPLANT
ELECT BLADE 4.0 EZ CLEAN MEGAD (MISCELLANEOUS) ×2
ELECT REM PT RETURN 9FT ADLT (ELECTROSURGICAL) ×2
ELECTRODE BLDE 4.0 EZ CLN MEGD (MISCELLANEOUS) ×1 IMPLANT
ELECTRODE REM PT RTRN 9FT ADLT (ELECTROSURGICAL) ×1 IMPLANT
GLOVE SURG LTX SZ7 (GLOVE) ×4 IMPLANT
GLOVE SURG SYN 7.5  E (GLOVE) ×4
GLOVE SURG SYN 7.5 E (GLOVE) ×4 IMPLANT
GLOVE SURG UNDER POLY LF SZ7 (GLOVE) ×40 IMPLANT
GLOVE SURG UNDER POLY LF SZ7.5 (GLOVE) ×4 IMPLANT
GOWN STRL REIN XL XLG (GOWN DISPOSABLE) ×2 IMPLANT
GOWN STRL REUS W/ TWL LRG LVL3 (GOWN DISPOSABLE) IMPLANT
GOWN STRL REUS W/ TWL XL LVL3 (GOWN DISPOSABLE) ×1 IMPLANT
GOWN STRL REUS W/TWL LRG LVL3 (GOWN DISPOSABLE)
GOWN STRL REUS W/TWL XL LVL3 (GOWN DISPOSABLE) ×1
HANDPIECE INTERPULSE COAX TIP (DISPOSABLE) ×1
HEAD CERAMIC 36 PLUS 8.5 12 14 (Hips) ×2 IMPLANT
HOOD PEEL AWAY FLYTE STAYCOOL (MISCELLANEOUS) ×4 IMPLANT
IV NS IRRIG 3000ML ARTHROMATIC (IV SOLUTION) ×2 IMPLANT
JET LAVAGE IRRISEPT WOUND (IRRIGATION / IRRIGATOR) ×2
KIT BASIN OR (CUSTOM PROCEDURE TRAY) ×2 IMPLANT
LAVAGE JET IRRISEPT WOUND (IRRIGATION / IRRIGATOR) ×1 IMPLANT
LINER NEUTRAL 54X36MM PLUS 4 (Hips) ×2 IMPLANT
MARKER SKIN DUAL TIP RULER LAB (MISCELLANEOUS) ×2 IMPLANT
NEEDLE SPNL 18GX3.5 QUINCKE PK (NEEDLE) ×2 IMPLANT
PACK TOTAL JOINT (CUSTOM PROCEDURE TRAY) ×2 IMPLANT
PACK UNIVERSAL I (CUSTOM PROCEDURE TRAY) ×2 IMPLANT
PAD COLD SHLDR WRAP-ON (PAD) ×2 IMPLANT
RTRCTR WOUND ALEXIS 18CM MED (MISCELLANEOUS)
SAW OSC TIP CART 19.5X105X1.3 (SAW) ×2 IMPLANT
SET HNDPC FAN SPRY TIP SCT (DISPOSABLE) ×1 IMPLANT
STAPLER VISISTAT 35W (STAPLE) IMPLANT
STEM FEM ACTIS HIGH SZ2 (Stem) ×2 IMPLANT
SUT ETHIBOND 2 V 37 (SUTURE) ×2 IMPLANT
SUT VIC AB 0 CT1 27 (SUTURE) ×1
SUT VIC AB 0 CT1 27XBRD ANBCTR (SUTURE) ×1 IMPLANT
SUT VIC AB 1 CTX 36 (SUTURE) ×1
SUT VIC AB 1 CTX36XBRD ANBCTR (SUTURE) ×1 IMPLANT
SUT VIC AB 2-0 CT1 27 (SUTURE) ×4
SUT VIC AB 2-0 CT1 TAPERPNT 27 (SUTURE) ×4 IMPLANT
SYR 50ML LL SCALE MARK (SYRINGE) ×2 IMPLANT
TOWEL GREEN STERILE (TOWEL DISPOSABLE) ×2 IMPLANT
TRAY CATH 16FR W/PLASTIC CATH (SET/KITS/TRAYS/PACK) ×2 IMPLANT
TRAY FOLEY W/BAG SLVR 16FR (SET/KITS/TRAYS/PACK) ×1
TRAY FOLEY W/BAG SLVR 16FR ST (SET/KITS/TRAYS/PACK) ×1 IMPLANT
YANKAUER SUCT BULB TIP NO VENT (SUCTIONS) ×2 IMPLANT

## 2021-08-25 NOTE — Transfer of Care (Signed)
Immediate Anesthesia Transfer of Care Note  Patient: Christy Todd  Procedure(s) Performed: RIGHT TOTAL HIP ARTHROPLASTY ANTERIOR APPROACH (Right: Hip)  Patient Location: PACU  Anesthesia Type:MAC combined with regional for post-op pain  Level of Consciousness: awake  Airway & Oxygen Therapy: Patient Spontanous Breathing  Post-op Assessment: Report given to RN and Post -op Vital signs reviewed and stable  Post vital signs: Reviewed and stable  Last Vitals:  Vitals Value Taken Time  BP 118/79 08/25/21 1156  Temp 37.1 C 08/25/21 1156  Pulse 64 08/25/21 1158  Resp 20 08/25/21 1158  SpO2 99 % 08/25/21 1158  Vitals shown include unvalidated device data.  Last Pain:  Vitals:   08/25/21 0844  TempSrc:   PainSc: 6       Patients Stated Pain Goal: 4 (53/29/92 4268)  Complications: No notable events documented.

## 2021-08-25 NOTE — Evaluation (Signed)
Physical Therapy Evaluation Patient Details Name: Christy Todd MRN: [id number] DOB: 11-01-1961 Today's Date: 08/25/2021  History of Present Illness  Pt is a 60 y/o female s/p R THA, direct anterior on 10/24. PMH includes HTN and sleep apnea on CPAP.  Clinical Impression  Pt is s/p surgery above with deficits below. Pt requiring min guard A to stand and transfer to chair. Pt with increased dizziness and nausea, so further mobility limited.  Anticipate pt will progress well once symptoms improve. Pt reports plan is for HHPT services at d/c. Will continue to follow acutely.      Recommendations for follow up therapy are one component of a multi-disciplinary discharge planning process, led by the attending physician.  Recommendations may be updated based on patient status, additional functional criteria and insurance authorization.  Follow Up Recommendations Follow physician's recommendations for discharge plan and follow up therapies    Assistance Recommended at Discharge Intermittent Supervision/Assistance  Functional Status Assessment    Equipment Recommendations  Rolling walker (2 wheels)    Recommendations for Other Services       Precautions / Restrictions Precautions Precautions: Fall Restrictions Weight Bearing Restrictions: No      Mobility  Bed Mobility Overal bed mobility: Needs Assistance Bed Mobility: Supine to Sit     Supine to sit: Min assist     General bed mobility comments: Min A for RLE assist.    Transfers Overall transfer level: Needs assistance Equipment used: Rolling walker (2 wheels) Transfers: Sit to/from [facility] Sit to Stand: Min guard Stand pivot transfers: Min guard         General transfer comment: Min guard for safety to stand and pivot to chair. Pt with increased nausea and dizziness so mobility limited to chair. Cues for hand placement.    Ambulation/Gait                Stairs             Wheelchair Mobility    Modified Rankin (Stroke Patients Only)       Balance Overall balance assessment: Needs assistance Sitting-balance support: No upper extremity supported;Feet supported Sitting balance-Leahy Scale: Good     Standing balance support: Bilateral upper extremity supported;During functional activity;Reliant on assistive device for balance Standing balance-Leahy Scale: Poor Standing balance comment: Reliant on BUE support on RW                             Pertinent Vitals/Pain Pain Assessment: Faces Faces Pain Scale: Hurts even more Pain Location: R hip Pain Descriptors / Indicators: Cramping Pain Intervention(s): Limited activity within patient's tolerance;Monitored during session;Repositioned    Home Living Family/patient expects to be discharged to:: Private residence Living Arrangements: Spouse/significant other Available Help at Discharge: Family Type of Home: House Home Access: Stairs to enter Entrance Stairs-Rails: None Technical brewer of Steps: 1   Home Layout: Able to live on main level with bedroom/bathroom Home Equipment: BSC      Prior Function Prior Level of Function : Independent/Modified Independent                     Hand Dominance        Extremity/Trunk Assessment   Upper Extremity Assessment Upper Extremity Assessment: Overall WFL for tasks assessed    Lower Extremity Assessment Lower Extremity Assessment: RLE deficits/detail RLE Deficits / Details: Deficits consistent with post op pain and weakness.    Cervical /  Trunk Assessment Cervical / Trunk Assessment: Normal  Communication   Communication: No difficulties  Cognition Arousal/Alertness: Awake/alert Behavior During Therapy: WFL for tasks assessed/performed Overall Cognitive Status: Within Functional Limits for tasks assessed                                          General Comments General comments (skin integrity,  edema, etc.): Pt's husband present during session    Exercises     Assessment/Plan    PT Assessment Patient needs continued PT services  PT Problem List Decreased strength;Decreased range of motion;Decreased activity tolerance;Decreased balance;Decreased mobility;Decreased knowledge of use of DME;Decreased knowledge of precautions;Pain       PT Treatment Interventions DME instruction;Gait training;Stair training;Functional mobility training;Therapeutic activities;Therapeutic exercise;Balance training;Patient/family education    PT Goals (Current goals can be found in the Care Plan section)  Acute Rehab PT Goals Patient Stated Goal: to go home PT Goal Formulation: With patient Time For Goal Achievement: 09/08/21 Potential to Achieve Goals: Good    Frequency 7X/week   Barriers to discharge        Co-evaluation               AM-PAC PT "6 Clicks" Mobility  Outcome Measure Help needed turning from your back to your side while in a flat bed without using bedrails?: A Little Help needed moving from lying on your back to sitting on the side of a flat bed without using bedrails?: A Little Help needed moving to and from a bed to a chair (including a wheelchair)?: A Little Help needed standing up from a chair using your arms (e.g., wheelchair or bedside chair)?: A Little Help needed to walk in hospital room?: A Little Help needed climbing 3-5 steps with a railing? : A Lot 6 Click Score: 17    End of Session Equipment Utilized During Treatment: Gait belt Activity Tolerance: Treatment limited secondary to medical complications (Comment) (dizziness, nausea) Patient left: in chair;with call bell/phone within reach;with family/visitor present Nurse Communication: Mobility status;Other (comment) (pt needs nausea meds) PT Visit Diagnosis: Other abnormalities of gait and mobility (R26.89);Pain Pain - Right/Left: Right Pain - part of body: Hip    Time: 4097-3532 PT Time  Calculation (min) (ACUTE ONLY): 21 min   Charges:   PT Evaluation $PT Eval Low Complexity: 1 Low          Lou Miner, DPT  Acute Rehabilitation Services  Pager: [phone] Office: [phone]   Rudean Hitt 08/25/2021, 4:59 PM

## 2021-08-25 NOTE — Progress Notes (Signed)
Tried calling to discuss. No answer.

## 2021-08-25 NOTE — Anesthesia Postprocedure Evaluation (Signed)
Anesthesia Post Note  Patient: Christy Todd  Procedure(s) Performed: RIGHT TOTAL HIP ARTHROPLASTY ANTERIOR APPROACH (Right: Hip)     Patient location during evaluation: PACU Anesthesia Type: Spinal Level of consciousness: awake and alert and oriented Pain management: pain level controlled Vital Signs Assessment: post-procedure vital signs reviewed and stable Respiratory status: spontaneous breathing, nonlabored ventilation and respiratory function stable Cardiovascular status: blood pressure returned to baseline Postop Assessment: no apparent nausea or vomiting, spinal receding, no headache and no backache Anesthetic complications: no   No notable events documented.  Last Vitals:  Vitals:   08/25/21 1240 08/25/21 1310  BP: 118/70 114/67  Pulse: (!) 57 61  Resp: 20 16  Temp:  36.4 C  SpO2: 97% 100%    Last Pain:  Vitals:   08/25/21 1310  TempSrc: Oral  PainSc: 0-No pain                 Marthenia Rolling

## 2021-08-25 NOTE — Progress Notes (Signed)
Please let patient know that her urinalysis is abnormal and I have sent in antibiotics to start asap
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
## 2021-08-25 NOTE — Anesthesia Procedure Notes (Signed)
Procedure Name: MAC Date/Time: 08/25/2021 10:15 AM Performed by: Erick Colace, CRNA Pre-anesthesia Checklist: Patient identified, Emergency Drugs available, Patient being monitored, Suction available and Timeout performed Patient Re-evaluated:Patient Re-evaluated prior to induction Oxygen Delivery Method: Simple face mask Preoxygenation: Pre-oxygenation with 100% oxygen Induction Type: IV induction Ventilation: Oral airway inserted - appropriate to patient size

## 2021-08-25 NOTE — Op Note (Signed)
RIGHT TOTAL HIP ARTHROPLASTY ANTERIOR APPROACH  Procedure Note Christy Todd   [id number]  Pre-op Diagnosis: right hip degenerative joint disease     Post-op Diagnosis: same   Operative Procedures  1. Total hip replacement; Right hip; uncemented [id number]   Surgeon: Frankey Shown, M.D.  Assist: Madalyn Rob, PA-C   Anesthesia: spinal, local  Prosthesis: Depuy Acetabulum: Pinnacle 54 mm Femur: Actis 2 HO Head: 36 mm size: +8.5 Liner: +4 Bearing Type: ceramic/poly  Total Hip Arthroplasty (Anterior Approach) Op Note:  After informed consent was obtained and the operative extremity marked in the holding area, the patient was brought back to the operating room and placed supine on the HANA table. Next, the operative extremity was prepped and draped in normal sterile fashion. Surgical timeout occurred verifying patient identification, surgical site, surgical procedure and administration of antibiotics.  A modified anterior Smith-Peterson approach to the hip was performed, using the interval between tensor fascia lata and sartorius.  Dissection was carried bluntly down onto the anterior hip capsule. The lateral femoral circumflex vessels were identified and coagulated. A capsulotomy was performed and the capsular flaps tagged for later repair.  There was a large joint effusion.  The neck osteotomy was performed. The femoral head was removed which showed severe wear, the acetabular rim was cleared of soft tissue and attention was turned to reaming the acetabulum.  Sequential reaming was performed under fluoroscopic guidance. We reamed to a size 53 mm, and then impacted the acetabular shell which had excellent fixation. The liner was then placed after irrigation and attention turned to the femur.  After placing the femoral hook, the leg was taken to externally rotated, extended and adducted position taking care to perform soft tissue releases to allow for adequate mobilization of the  femur. Soft tissue was cleared from the shoulder of the greater trochanter and the hook elevator used to improve exposure of the proximal femur. Sequential broaching performed up to a size 2. Trial neck and head were placed and we found that the combination of high offset and +8.5 imparted stability.  I felt that the best thing to do was the lengthen her leg in order to create stability to the hip joint especially since the left hip joint was also degenerative and likely would need a hip replacement in the near future.  The leg was brought back up to neutral and the construct reduced.  Antibiotic irrigation was placed in the surgical wound and kept for 1 minute.  The position and sizing of components, offset and leg lengths were checked using fluoroscopy. Stability of the construct was checked in extension and external rotation without any subluxation or impingement of prosthesis.  We dislocated the prosthesis, dropped the leg back into position, removed trial components, and irrigated copiously. The final stem and head was then placed, the leg brought back up, the system reduced and fluoroscopy used to verify positioning.  We irrigated, obtained hemostasis and closed the capsule using #2 ethibond suture.  One gram of vancomycin powder was placed in the surgical bed.   One gram of topical tranexamic acid was injected into the joint.  The fascia was closed with #1 vicryl plus, the deep fat layer was closed with 0 vicryl, the subcutaneous layers closed with 2.0 Vicryl Plus and the skin closed with 2.0 nylon and dermabond. A sterile dressing was applied. The patient was awakened in the operating room and taken to recovery in stable condition.  All sponge, needle, and instrument counts  were correct at the end of the case.   Tawanna Cooler, my PA, was a medical necessity for opening, closing, limb positioning, retracting, exposing, and overall facilitation and timely completion of the surgery.  Position: supine   Complications: see description of procedure.  Time Out: performed   Drains/Packing: none  Estimated blood loss: see anesthesia record  Returned to Recovery Room: in good condition.   Antibiotics: yes   Mechanical VTE (DVT) Prophylaxis: sequential compression devices, TED thigh-high  Chemical VTE (DVT) Prophylaxis: aspirin   Fluid Replacement: see anesthesia record  Specimens Removed: 1 to pathology   Sponge and Instrument Count Correct? yes   PACU: portable radiograph - low AP   Plan/RTC: Return in 2 weeks for staple removal. Weight Bearing/Load Lower Extremity: full  Hip precautions: none Suture Removal: 2 weeks   N. Eduard Roux, MD [facility] 11:17 AM   Implant Name Type Inv. Item Serial No. Manufacturer Lot No. LRB No. Used Action  ACETAB CUP Daisy Blossom 54MM - [id number] Plate ACETAB CUP W GRIPTION 54MM  [employer] [id number] Right 1 Implanted  LINER NEUTRAL 54X36MM PLUS 4 - [id number] Hips LINER NEUTRAL 54X36MM PLUS 4  [employer] [id number] Right 1 Implanted  HEAD CERAMIC 36 PLUS 8.5 12 14  - [id number] Hips HEAD CERAMIC 36 PLUS 8.5 12 14   [employer] [id number] Right 1 Implanted  STEM FEM ACTIS HIGH SZ2 - [id number] Stem STEM FEM ACTIS HIGH SZ2  [employer] [id number] Right 1 Implanted

## 2021-08-25 NOTE — Discharge Instructions (Signed)

## 2021-08-25 NOTE — Anesthesia Procedure Notes (Signed)
Spinal  Patient location during procedure: OR Start time: 08/25/2021 9:52 AM End time: 08/25/2021 9:56 AM Reason for block: surgical anesthesia Staffing Performed: anesthesiologist  Anesthesiologist: Brennan Bailey, MD Preanesthetic Checklist Completed: patient identified, IV checked, risks and benefits discussed, surgical consent, monitors and equipment checked, pre-op evaluation and timeout performed Spinal Block Patient position: sitting Prep: DuraPrep and site prepped and draped Patient monitoring: continuous pulse ox, blood pressure and heart rate Approach: midline Location: L3-4 Injection technique: single-shot Needle Needle type: Pencan  Needle gauge: 24 G Needle length: 9 cm Assessment Events: CSF return Additional Notes Risks, benefits, and alternative discussed. Patient gave consent to procedure. Prepped and draped in sitting position. Patient sedated but responsive to voice. Clear CSF obtained after one needle pass. Positive terminal aspiration. No pain or paraesthesias with injection. Patient tolerated procedure well. Vital signs stable. Tawny Asal, MD

## 2021-08-26 ENCOUNTER — Encounter (HOSPITAL_COMMUNITY): Payer: Self-pay | Admitting: Orthopaedic Surgery

## 2021-08-26 DIAGNOSIS — M1611 Unilateral primary osteoarthritis, right hip: Secondary | ICD-10-CM | POA: Diagnosis not present

## 2021-08-26 LAB — BASIC METABOLIC PANEL
Anion gap: 4 — ABNORMAL LOW (ref 5–15)
BUN: 13 mg/dL (ref 6–20)
CO2: 29 mmol/L (ref 22–32)
Calcium: 8.6 mg/dL — ABNORMAL LOW (ref 8.9–10.3)
Chloride: 103 mmol/L (ref 98–111)
Creatinine, Ser: 1.01 mg/dL — ABNORMAL HIGH (ref 0.44–1.00)
GFR, Estimated: >60 mL/min (ref >60)
Glucose, Bld: 100 mg/dL — ABNORMAL HIGH (ref 70–99)
Potassium: 4.4 mmol/L (ref 3.5–5.1)
Sodium: 136 mmol/L (ref 135–145)

## 2021-08-26 LAB — CBC
HCT: 31 % — ABNORMAL LOW (ref 36.0–46.0)
Hemoglobin: 10 g/dL — ABNORMAL LOW (ref 12.0–15.0)
MCH: 28.8 pg (ref 26.0–34.0)
MCHC: 32.3 g/dL (ref 30.0–36.0)
MCV: 89.3 fL (ref 80.0–100.0)
Platelets: 232 10*3/uL (ref 150–400)
RBC: 3.47 MIL/uL — ABNORMAL LOW (ref 3.87–5.11)
RDW: 13.2 % (ref 11.5–15.5)
WBC: 7.7 10*3/uL (ref 4.0–10.5)
nRBC: 0 % (ref 0.0–0.2)

## 2021-08-26 NOTE — Progress Notes (Signed)
Physical Therapy Treatment Patient Details Name: Christy Todd MRN: [id number] DOB: Oct 13, 1961 Today's Date: 08/26/2021   History of Present Illness Pt is a 60 y/o female s/p R THA, direct anterior on 10/24. PMH includes HTN and sleep apnea on CPAP.    PT Comments    Pt progressing towards her physical therapy goals. Session focused on reviewing HEP for strengthening/ROM and ambulation progression. Pt ambulating 160 feet with a walker, utilizing a step to pattern and slowed pace. Unable to progress to step through at this time. Pt reporting mild dizziness and "feeling hot;" no nausea this session. Will benefit from stair training (pt has one step to enter) prior to discharge.     Recommendations for follow up therapy are one component of a multi-disciplinary discharge planning process, led by the attending physician.  Recommendations may be updated based on patient status, additional functional criteria and insurance authorization.  Follow Up Recommendations  Follow physician's recommendations for discharge plan and follow up therapies     Assistance Recommended at Discharge Intermittent Supervision/Assistance  Equipment Recommendations  Rolling walker (2 wheels)    Recommendations for Other Services       Precautions / Restrictions Precautions Precautions: Fall Restrictions Weight Bearing Restrictions: No     Mobility  Bed Mobility Overal bed mobility: Modified Independent Bed Mobility: Supine to Sit     Supine to sit: Min assist     General bed mobility comments: HOB elevated, pt bringing BLE's off edge of bed together    Transfers Overall transfer level: Needs assistance Equipment used: Rolling walker (2 wheels) Transfers: Sit to/from Stand Sit to Stand: Supervision                Ambulation/Gait Ambulation/Gait assistance: Supervision Gait Distance (Feet): 160 Feet Assistive device: Rolling walker (2 wheels) Gait Pattern/deviations: Step-to  pattern;Decreased stance time - right;Decreased weight shift to right;Antalgic Gait velocity: decreased Gait velocity interpretation: <1.31 ft/sec, indicative of household ambulator General Gait Details: Pt utilizing step to gait pattern, unable to progress to step through at this time, very slow pace, cues for heel strike and walker use   Stairs             Wheelchair Mobility    Modified Rankin (Stroke Patients Only)       Balance Overall balance assessment: Needs assistance Sitting-balance support: No upper extremity supported;Feet supported Sitting balance-Leahy Scale: Good     Standing balance support: Bilateral upper extremity supported;During functional activity;Reliant on assistive device for balance Standing balance-Leahy Scale: Poor Standing balance comment: Reliant on BUE support on RW                            Cognition Arousal/Alertness: Awake/alert Behavior During Therapy: WFL for tasks assessed/performed Overall Cognitive Status: Within Functional Limits for tasks assessed                                          Exercises Total Joint Exercises Ankle Circles/Pumps: Both;20 reps;Supine Quad Sets: Both;15 reps;Supine Heel Slides: Right;10 reps;Supine Hip ABduction/ADduction: AAROM;Right;10 reps;Supine    General Comments        Pertinent Vitals/Pain Pain Assessment: Faces Faces Pain Scale: Hurts even more Pain Location: R hip, "muscle," pain Pain Descriptors / Indicators: Operative site guarding;Grimacing Pain Intervention(s): Monitored during session;Limited activity within patient's tolerance    Home Living  Prior Function            PT Goals (current goals can now be found in the care plan section) Acute Rehab PT Goals Patient Stated Goal: to go home PT Goal Formulation: With patient Time For Goal Achievement: 09/08/21 Potential to Achieve Goals: Good Progress towards  PT goals: Progressing toward goals    Frequency    7X/week      PT Plan Current plan remains appropriate    Co-evaluation              AM-PAC PT "6 Clicks" Mobility   Outcome Measure  Help needed turning from your back to your side while in a flat bed without using bedrails?: None Help needed moving from lying on your back to sitting on the side of a flat bed without using bedrails?: None Help needed moving to and from a bed to a chair (including a wheelchair)?: A Little Help needed standing up from a chair using your arms (e.g., wheelchair or bedside chair)?: A Little Help needed to walk in hospital room?: A Little Help needed climbing 3-5 steps with a railing? : A Little 6 Click Score: 20    End of Session   Activity Tolerance: Patient tolerated treatment well Patient left: with call bell/phone within reach;in bed Nurse Communication: Mobility status PT Visit Diagnosis: Other abnormalities of gait and mobility (R26.89);Pain Pain - Right/Left: Right Pain - part of body: Hip     Time: 0827-0901 PT Time Calculation (min) (ACUTE ONLY): 34 min  Charges:  $Gait Training: 8-22 mins $Therapeutic Exercise: 8-22 mins                     Wyona Almas, PT, DPT Acute Rehabilitation Services Pager [phone] Office [phone]    [name] 08/26/2021, 11:06 AM

## 2021-08-26 NOTE — Discharge Summary (Signed)
Patient ID: Christy Todd MRN: [id number] DOB/AGE: [birth year] 60 y.o.  Admit date: 08/25/2021 Discharge date: 08/26/2021  Admission Diagnoses:  Principal Problem:   Primary osteoarthritis of right hip Active Problems:   Status post total replacement of right hip   Discharge Diagnoses:  Same  Past Medical History:  Diagnosis Date   Arthritis    Asthma    COVID-19 07/23/2021   Depression    GERD (gastroesophageal reflux disease)    Hypertension    Hypothyroid    Pre-diabetes    Sleep apnea    on CPAP   Wears hearing aid in right ear     Surgeries: Procedure(s): RIGHT TOTAL HIP ARTHROPLASTY ANTERIOR APPROACH on 08/25/2021   Consultants:   Discharged Condition: Improved  Hospital Course: Christy Todd is an 60 y.o. female who was admitted 08/25/2021 for operative treatment ofPrimary osteoarthritis of right hip. Patient has severe unremitting pain that affects sleep, daily activities, and work/hobbies. After pre-op clearance the patient was taken to the operating room on 08/25/2021 and underwent  Procedure(s): RIGHT TOTAL HIP ARTHROPLASTY ANTERIOR APPROACH.    Patient was given perioperative antibiotics:  Anti-infectives (From admission, onward)    Start     Dose/Rate Route Frequency Ordered Stop   08/25/21 1600  ceFAZolin (ANCEF) IVPB 2g/100 mL premix        2 g 200 mL/hr over 30 Minutes Intravenous Every 6 hours 08/25/21 1315 08/25/21 2207   08/25/21 1029  vancomycin (VANCOCIN) powder  Status:  Discontinued          As needed 08/25/21 1029 08/25/21 1150   08/25/21 0845  ceFAZolin (ANCEF) IVPB 2g/100 mL premix        2 g 200 mL/hr over 30 Minutes Intravenous On call to O.R. 08/25/21 4098 08/25/21 1017        Patient was given sequential compression devices, early ambulation, and chemoprophylaxis to prevent DVT.  Patient benefited maximally from hospital stay and there were no complications.    Recent vital signs: Patient Vitals for the past 24 hrs:  BP  Temp Temp src Pulse Resp SpO2 Height Weight  08/26/21 0736 (!) 108/43 (!) 97.2 F (36.2 C) -- 70 16 98 % -- --  08/26/21 0542 109/64 (!) 97.5 F (36.4 C) Oral 66 18 99 % -- --  08/25/21 2341 (!) 134/56 97.8 F (36.6 C) Oral 73 18 96 % -- --  08/25/21 2013 134/78 98.3 F (36.8 C) Oral 80 20 100 % -- --  08/25/21 1610 121/68 (!) 97.2 F (36.2 C) -- 63 16 100 % -- --  08/25/21 1310 114/67 97.6 F (36.4 C) Oral 61 16 100 % -- --  08/25/21 1240 118/70 -- -- (!) 57 20 97 % -- --  08/25/21 1225 121/66 -- -- 60 18 93 % -- --  08/25/21 1210 112/71 -- -- (!) 58 14 -- -- --  08/25/21 1156 118/79 98.8 F (37.1 C) -- 63 18 98 % -- --  08/25/21 0809 (!) 129/50 (!) 97.5 F (36.4 C) Oral 80 17 100 % 5\' 6"  (1.676 m) 100.2 kg     Recent laboratory studies:  Recent Labs    08/26/21 0500  WBC 7.7  HGB 10.0*  HCT 31.0*  PLT 232  NA 136  K 4.4  CL 103  CO2 29  BUN 13  CREATININE 1.01*  GLUCOSE 100*  CALCIUM 8.6*     Discharge Medications:   Allergies as of 08/26/2021  Reactions   Gluten Meal Other (See Comments)   Gut Distress   Apple    Chemical burn to mouth and gums   Ciprofloxacin    Detatched muscles and tendons    Claritin [loratadine]    allergic psychosis   Septra [sulfamethoxazole-trimethoprim] Rash        Medication List     STOP taking these medications    ibuprofen 800 MG tablet Commonly known as: ADVIL       TAKE these medications    albuterol 108 (90 Base) MCG/ACT inhaler Commonly known as: VENTOLIN HFA Inhale 2 puffs into the lungs every 6 (six) hours as needed for wheezing or shortness of breath.   aspirin EC 81 MG tablet Take 1 tablet (81 mg total) by mouth 2 (two) times daily. To be taken after surgery to prevent blood clots   docusate sodium 100 MG capsule Commonly known as: Colace Take 1 capsule (100 mg total) by mouth daily as needed.   DULoxetine 30 MG capsule Commonly known as: CYMBALTA 2 BY MOUTH EVERY MORNING, 1 BY MOUTH  EVERY AT NIGHT   levothyroxine 137 MCG tablet Commonly known as: SYNTHROID TAKE 1 TABLET BY MOUTH EVERY DAY IN THE MORNING What changed: See the new instructions.   lisinopril 20 MG tablet Commonly known as: ZESTRIL Take 1 tablet (20 mg total) by mouth daily. What changed: when to take this   methocarbamol 500 MG tablet Commonly known as: Robaxin Take 1 tablet (500 mg total) by mouth 2 (two) times daily as needed. To be taken after surgery   metoprolol succinate 50 MG 24 hr tablet Commonly known as: TOPROL-XL TAKE 1 TABLET BY MOUTH EVERY DAY   montelukast 10 MG tablet Commonly known as: SINGULAIR TAKE 1 TABLET BY MOUTH EVERY DAY   nitrofurantoin (macrocrystal-monohydrate) 100 MG capsule Commonly known as: Macrobid Take 1 capsule (100 mg total) by mouth every 6 (six) hours as needed for up to 5 days.   nortriptyline 25 MG capsule Commonly known as: PAMELOR Take 1 capsule (25 mg total) by mouth at bedtime as needed. What changed: when to take this   omeprazole 20 MG capsule Commonly known as: PRILOSEC Take 20 mg by mouth daily.   ondansetron 4 MG tablet Commonly known as: Zofran Take 1 tablet (4 mg total) by mouth every 8 (eight) hours as needed for nausea or vomiting.   oxyCODONE-acetaminophen 5-325 MG tablet Commonly known as: Percocet Take 1-2 tablets by mouth every 6 (six) hours as needed. To be taken after surgery   spironolactone 50 MG tablet Commonly known as: ALDACTONE Take 1 tablet (50 mg total) by mouth daily.   Vitamin D3 50 MCG (2000 UT) Tabs Take 2,000 Units by mouth daily.   Wegovy 2.4 MG/0.75ML Soaj Generic drug: Semaglutide-Weight Management Inject 2.4 mg into the skin once a week.               Durable Medical Equipment  (From admission, onward)           Start     Ordered   08/25/21 1316  DME Walker rolling  Once       Question:  Patient needs a walker to treat with the following condition  Answer:  History of hip replacement    08/25/21 1315   08/25/21 1316  DME 3 n 1  Once        08/25/21 1315   08/25/21 1316  DME Bedside commode  Once  Question:  Patient needs a bedside commode to treat with the following condition  Answer:  History of hip replacement   08/25/21 1315            Diagnostic Studies: DG Pelvis Portable  Result Date: 08/25/2021 CLINICAL DATA:  Right hip replacement EXAM: PORTABLE PELVIS 1-2 VIEWS COMPARISON:  06/06/2021 FINDINGS: Interval postsurgical changes from right total hip arthroplasty. Arthroplasty components are in their expected alignment. No periprosthetic fracture or evidence of other complication. Expected postoperative changes within the overlying soft tissues. IMPRESSION: Satisfactory postoperative appearance following right total hip arthroplasty. Electronically Signed   By: Davina Poke D.O.   On: 08/25/2021 13:12   DG C-Arm 1-60 Min-No Report  Result Date: 08/25/2021 CLINICAL DATA:  Right hip total arthroplasty EXAM: OPERATIVE RIGHT HIP (WITH PELVIS IF PERFORMED) 1 VIEWS TECHNIQUE: Fluoroscopic spot image(s) were submitted for interpretation post-operatively. COMPARISON:  06/06/2021 FLUOROSCOPY TIME:  26 seconds FINDINGS: Intraoperative fluoroscopic images of the right hip demonstrate total hip arthroplasty. No obvious perihardware fracture or component malposition. IMPRESSION: Intraoperative fluoroscopic images of the right hip demonstrate total hip arthroplasty. Electronically Signed   By: Delanna Ahmadi M.D.   On: 08/25/2021 14:35   DG C-Arm 1-60 Min-No Report  Result Date: 08/25/2021 CLINICAL DATA:  Right hip total arthroplasty EXAM: OPERATIVE RIGHT HIP (WITH PELVIS IF PERFORMED) 1 VIEWS TECHNIQUE: Fluoroscopic spot image(s) were submitted for interpretation post-operatively. COMPARISON:  06/06/2021 FLUOROSCOPY TIME:  26 seconds FINDINGS: Intraoperative fluoroscopic images of the right hip demonstrate total hip arthroplasty. No obvious perihardware fracture or component  malposition. IMPRESSION: Intraoperative fluoroscopic images of the right hip demonstrate total hip arthroplasty. Electronically Signed   By: Delanna Ahmadi M.D.   On: 08/25/2021 14:35   DG HIP OPERATIVE UNILAT WITH PELVIS RIGHT  Result Date: 08/25/2021 CLINICAL DATA:  Right hip total arthroplasty EXAM: OPERATIVE RIGHT HIP (WITH PELVIS IF PERFORMED) 1 VIEWS TECHNIQUE: Fluoroscopic spot image(s) were submitted for interpretation post-operatively. COMPARISON:  06/06/2021 FLUOROSCOPY TIME:  26 seconds FINDINGS: Intraoperative fluoroscopic images of the right hip demonstrate total hip arthroplasty. No obvious perihardware fracture or component malposition. IMPRESSION: Intraoperative fluoroscopic images of the right hip demonstrate total hip arthroplasty. Electronically Signed   By: Delanna Ahmadi M.D.   On: 08/25/2021 14:35    Disposition: Discharge disposition: 01-Home or Self Care          Follow-up Information     Leandrew Koyanagi, MD. Schedule an appointment as soon as possible for a visit in 2 week(s).   Specialty: Orthopedic Surgery Contact information: [address] [place] [address] [phone]                  Signed: Aundra Dubin 08/26/2021, 8:08 AM

## 2021-08-26 NOTE — Plan of Care (Signed)
Pt and husband given D/C instructions with verbal understanding. Rx's were sent to the pharmacy by MD. Pt's incision is clean and dry with no sign of infection. Pt's IV was removed prior to D/C. Pt D/C'd home via wheelchair per MD order. Pt received RW from [employer] per MD order. Pt is stable @ D/C and has no other needs at this time. Holli Humbles, RN

## 2021-08-26 NOTE — Progress Notes (Signed)
Subjective: 1 Day Post-Op Procedure(s) (LRB): RIGHT TOTAL HIP ARTHROPLASTY ANTERIOR APPROACH (Right) Patient reports pain as mild.    Objective: Vital signs in last 24 hours: Temp:  [97.2 F (36.2 C)-98.8 F (37.1 C)] 97.2 F (36.2 C) (10/25 0736) Pulse Rate:  [57-80] 70 (10/25 0736) Resp:  [14-20] 16 (10/25 0736) BP: (108-134)/(43-79) 108/43 (10/25 0736) SpO2:  [93 %-100 %] 98 % (10/25 0736) Weight:  [100.2 kg] 100.2 kg (10/24 0809)  Intake/Output from previous day: 10/24 0701 - 10/25 0700 In: 800 [I.V.:800] Out: 2200 [Urine:2000; Blood:200] Intake/Output this shift: No intake/output data recorded.  Recent Labs    08/26/21 0500  HGB 10.0*   Recent Labs    08/26/21 0500  WBC 7.7  RBC 3.47*  HCT 31.0*  PLT 232   Recent Labs    08/26/21 0500  NA 136  K 4.4  CL 103  CO2 29  BUN 13  CREATININE 1.01*  GLUCOSE 100*  CALCIUM 8.6*   No results for input(s): LABPT, INR in the last 72 hours.  Neurologically intact Neurovascular intact Sensation intact distally Intact pulses distally Dorsiflexion/Plantar flexion intact Incision: dressing C/D/I No cellulitis present Compartment soft   Assessment/Plan: 1 Day Post-Op Procedure(s) (LRB): RIGHT TOTAL HIP ARTHROPLASTY ANTERIOR APPROACH (Right) Advance diet Up with therapy D/C IV fluids WBAT RLE ABLA- mild and stable D/c after second PT session      Christy Todd 08/26/2021, 8:07 AM

## 2021-08-26 NOTE — Progress Notes (Signed)
PT TREATMENT NOTE  Session focused on completion of reviewing HEP and gait/stair training prior to discharge home. Pt negotiated 1 step with a walker to simulate home set up. Pt husband present to view technique and verbalized understanding as well. Pt with nausea so requested return to to the room following. RN notified to provide nausea meds. Instructed pt on home set up I.e. having chairs available in home to sit down as needed. Pt will benefit from follow up PT to further address strengthening, gait training, and functional mobility.   Christy Todd, PT, DPT Acute Rehabilitation Services Pager [phone] Office [phone]   08/26/21 1420  PT Visit Information  Last PT Received On 08/26/21  Assistance Needed +1  History of Present Illness Pt is a 60 y/o female s/p R THA, direct anterior on 10/24. PMH includes HTN and sleep apnea on CPAP.  Subjective Data  Patient Stated Goal to go home  Precautions  Precautions Fall  Restrictions  Weight Bearing Restrictions No  Pain Assessment  Pain Assessment Faces  Faces Pain Scale 6  Pain Location R hip, "muscle," pain  Pain Descriptors / Indicators Operative site guarding;Grimacing  Pain Intervention(s) Monitored during session  Cognition  Arousal/Alertness Awake/alert  Behavior During Therapy WFL for tasks assessed/performed  Overall Cognitive Status Within Functional Limits for tasks assessed  Bed Mobility  General bed mobility comments OOB in chair  Transfers  Overall transfer level Modified independent  Equipment used Rolling walker (2 wheels)  Ambulation/Gait  Ambulation/Gait assistance Supervision  Gait Distance (Feet) 50 Feet  Assistive device Rolling walker (2 wheels)  Gait Pattern/deviations Step-to pattern;Decreased stance time - right;Decreased weight shift to right;Antalgic  General Gait Details Pt utilizing step to gait pattern, unable to progress to step through at this time  Gait velocity decreased  Stairs Yes   Stairs assistance Min guard  Stair Management With walker  Number of Stairs 1  General stair comments cues for sequencing/technique  Balance  Overall balance assessment Needs assistance  Sitting-balance support No upper extremity supported;Feet supported  Sitting balance-Leahy Scale Good  Standing balance support Bilateral upper extremity supported;During functional activity;Reliant on assistive device for balance  Standing balance-Leahy Scale Poor  Standing balance comment Reliant on BUE support on RW  Exercises  Exercises Total Joint  Total Joint Exercises  Long Arc Quad Right;10 reps;Seated  PT - End of Session  Activity Tolerance Other (comment) (limited by nausea)  Patient left with call bell/phone within reach;in bed  Nurse Communication Mobility status   PT - Assessment/Plan  PT Plan Current plan remains appropriate  PT Visit Diagnosis Other abnormalities of gait and mobility (R26.89);Pain  Pain - Right/Left Right  Pain - part of body Hip  PT Frequency (ACUTE ONLY) 7X/week  Follow Up Recommendations Follow physician's recommendations for discharge plan and follow up therapies  Assistance recommended at discharge Intermittent Supervision/Assistance  PT equipment Rolling walker (2 wheels)  AM-PAC PT "6 Clicks" Mobility Outcome Measure (Version 2)  Help needed turning from your back to your side while in a flat bed without using bedrails? 4  Help needed moving from lying on your back to sitting on the side of a flat bed without using bedrails? 4  Help needed moving to and from a bed to a chair (including a wheelchair)? 3  Help needed standing up from a chair using your arms (e.g., wheelchair or bedside chair)? 4  Help needed to walk in hospital room? 3  Help needed climbing 3-5 steps with a railing?  3  6 Click Score 21  Consider Recommendation of Discharge To: Home with no services  Progressive Mobility  What is the highest level of mobility based on the progressive  mobility assessment? Level 5 (Walks with assist in room/hall) - Balance while stepping forward/back and can walk in room with assist - Complete  Mobility Ambulated with assistance in hallway  PT Goal Progression  Progress towards PT goals Progressing toward goals  Acute Rehab PT Goals  PT Goal Formulation With patient  Time For Goal Achievement 09/08/21  Potential to Achieve Goals Good  PT Time Calculation  PT Start Time (ACUTE ONLY) 1234  PT Stop Time (ACUTE ONLY) 1256  PT Time Calculation (min) (ACUTE ONLY) 22 min  PT General Charges  $$ ACUTE PT VISIT 1 Visit  PT Treatments  $Gait Training 8-22 mins

## 2021-08-28 NOTE — Telephone Encounter (Signed)
I spoke to Christy Todd and notified her of the problem and she'll just do her own exercises for now and then we can see if she needs outpatient PT at 2 weeks when she sees [place] in clinic.

## 2021-08-28 NOTE — Telephone Encounter (Signed)
Emailed [facility]

## 2021-08-28 NOTE — Telephone Encounter (Signed)
Christy Todd is unable to see patient- short staffed. Do you want me to see if another agency will take her or would you rather her do outpatient PT ?

## 2021-09-09 ENCOUNTER — Other Ambulatory Visit: Payer: Self-pay

## 2021-09-09 ENCOUNTER — Encounter: Payer: Self-pay | Admitting: Orthopaedic Surgery

## 2021-09-09 ENCOUNTER — Ambulatory Visit (INDEPENDENT_AMBULATORY_CARE_PROVIDER_SITE_OTHER): Payer: Managed Care, Other (non HMO) | Admitting: Orthopaedic Surgery

## 2021-09-09 DIAGNOSIS — Z96641 Presence of right artificial hip joint: Secondary | ICD-10-CM

## 2021-09-09 NOTE — Progress Notes (Signed)
Post-Op Visit Note   Patient: Christy Todd           Date of Birth: 11-Sep-1961           MRN: [id number] Visit Date: 09/09/2021 PCP: Luetta Nutting, DO   Assessment & Plan:  Chief Complaint:  Chief Complaint  Patient presents with   Right Hip - Pain   Visit Diagnoses:  1. Status post total replacement of right hip     Plan: Patient is a pleasant 60 year old female who comes in today 2 weeks status post right total hip replacement 08/25/2021.  She has been doing great.  She was unable to receive home health physical therapy due to an illness going around the service.  She does note that she is recently started outpatient physical therapy and is doing well.  She is ambulating with a walker although she feels as though she can use a cane.  She has not been taking pain medication for over a week.  She has been compliant with the baby aspirin twice daily for DVT prophylaxis.  She denies any calf pain, chest pain or shortness of breath.  Examination right hip reveals a well healing surgical incision with nylon sutures in place.  No evidence of infection or cellulitis.  Calf soft nontender.  She is neurovascular intact distally.  At this point, she will advance with activity as tolerated.  Continue with physical therapy.  I provided her with a prescription for a 4 pronged cane.  Dental prophylaxis reinforced.  Follow-up with [place] in 4 weeks time for repeat evaluation AP pelvis x-rays.  Call with concerns or questions.  Follow-Up Instructions: Return in about 4 weeks (around 10/07/2021).   Orders:  No orders of the defined types were placed in this encounter.  No orders of the defined types were placed in this encounter.   Imaging: No new imaging  PMFS History: Patient Active Problem List   Diagnosis Date Noted   Status post total replacement of right hip 08/25/2021   Primary osteoarthritis of right hip 06/06/2021   Morbid obesity ([place]) 11/14/2020   Sleep behavior disorder, REM  07/02/2020   History of seizure 07/02/2020   Obesity with alveolar hypoventilation and body mass index (BMI) of 40 or greater ([place]) 07/02/2020   Severe obstructive sleep apnea-hypopnea syndrome 07/02/2020   OSA on CPAP 07/02/2020   Essential hypertension 05/07/2020   Chronic ankle pain 05/07/2020   Degenerative joint disease (DJD) of hip 05/07/2020   Seizure disorder ([place]) 05/07/2020   Hypothyroidism 05/07/2020   Mild persistent asthma 05/07/2020   Past Medical History:  Diagnosis Date   Arthritis    Asthma    COVID-19 07/23/2021   Depression    GERD (gastroesophageal reflux disease)    Hypertension    Hypothyroid    Pre-diabetes    Sleep apnea    on CPAP   Wears hearing aid in right ear     Family History  Problem Relation Age of Onset   Hypertension Father    Pancreatic cancer Father    Diabetes Maternal Grandmother    Colon cancer Neg Hx    Stomach cancer Neg Hx    Esophageal cancer Neg Hx     Past Surgical History:  Procedure Laterality Date   ABDOMINAL HYSTERECTOMY     ANKLE RECONSTRUCTION     CHOLECYSTECTOMY     COLONOSCOPY  12/09/2018   [facility].  3 mm sessile polyp, hepatic flexure. Mild - Moderate  diverticulosis, sigmoid colon.   CYSTOCELE REPAIR     ESOPHAGOGASTRODUODENOSCOPY  12/09/2018   Dr[facility]. Normal upper GI tract   RECTOCELE REPAIR     TENDON REPAIR     TOTAL HIP ARTHROPLASTY Right 08/25/2021   Procedure: RIGHT TOTAL HIP ARTHROPLASTY ANTERIOR APPROACH;  Surgeon: Leandrew Koyanagi, MD;  Location: [facility];  Service: Orthopedics;  Laterality: Right;  3-C   Social History   Occupational History   Not on file  Tobacco Use   Smoking status: Never   Smokeless tobacco: Never  Vaping Use   Vaping Use: Never used  Substance and Sexual Activity   Alcohol use: Yes    Alcohol/week: 1.0 standard drink    Types: 1 Standard drinks or equivalent per week    Comment: Rarely   Drug  use: Never   Sexual activity: Yes    Partners: Male

## 2021-09-10 ENCOUNTER — Other Ambulatory Visit: Payer: Self-pay | Admitting: Physician Assistant

## 2021-09-14 ENCOUNTER — Encounter: Payer: Self-pay | Admitting: Family Medicine

## 2021-09-18 ENCOUNTER — Encounter: Payer: Self-pay | Admitting: Family Medicine

## 2021-09-19 ENCOUNTER — Encounter: Payer: Self-pay | Admitting: Physician Assistant

## 2021-09-19 ENCOUNTER — Ambulatory Visit (INDEPENDENT_AMBULATORY_CARE_PROVIDER_SITE_OTHER): Payer: Managed Care, Other (non HMO) | Admitting: Physician Assistant

## 2021-09-19 ENCOUNTER — Other Ambulatory Visit: Payer: Self-pay

## 2021-09-19 VITALS — BP 98/59 | HR 102 | Temp 97.9°F | Wt 212.0 lb

## 2021-09-19 DIAGNOSIS — I951 Orthostatic hypotension: Secondary | ICD-10-CM

## 2021-09-19 DIAGNOSIS — R634 Abnormal weight loss: Secondary | ICD-10-CM

## 2021-09-19 DIAGNOSIS — R42 Dizziness and giddiness: Secondary | ICD-10-CM | POA: Diagnosis not present

## 2021-09-19 DIAGNOSIS — I10 Essential (primary) hypertension: Secondary | ICD-10-CM | POA: Diagnosis not present

## 2021-09-19 NOTE — Patient Instructions (Addendum)
Stop spirolactone, lisinopril and metoprolol.  If BP above 130/90 then can restart lisinopril 10mg .  Call back on Monday.   Hypotension As your heart beats, it forces blood through your body. Hypotension, commonly called low blood pressure, is when the force of blood pumping through your arteries is too weak. Arteries are blood vessels that carry blood from the heart throughout the body. Depending on the cause and severity, hypotension may be harmless (benign) or may cause serious problems (be critical). When blood pressure is too low, you may not get enough blood to your brain or to the rest of your organs. This can cause weakness, light-headedness, rapid heartbeat, and fainting. What are the causes? This condition may be caused by: Blood loss. Loss of body fluids (dehydration). Heart problems. Hormone (endocrine) problems. Pregnancy. Severe infection. Lack of certain nutrients. Severe allergic reactions (anaphylaxis). Certain medicines, such as blood pressure medicine or medicines that make the body lose excess fluids (diuretics). Sometimes, hypotension may be caused by not taking medicine as directed, such as taking too much of a certain medicine. What increases the risk? The following factors may make you more likely to develop this condition: Age. Risk increases as you get older. Conditions that affect the heart or the central nervous system. Taking certain medicines, such as blood pressure medicine or diuretics. Being pregnant. What are the signs or symptoms? Common symptoms of this condition include: Weakness. Light-headedness. Dizziness. Blurred vision. Fatigue. Rapid heartbeat. Fainting, in severe cases. How is this diagnosed? This condition is diagnosed based on: Your medical history. Your symptoms. Your blood pressure measurement. Your health care provider will check your blood pressure when you are: Lying down. Sitting. Standing. A blood pressure reading is  recorded as two numbers, such as "120 over 80" (or 120/80). The first ("top") number is called the systolic pressure. It is a measure of the pressure in your arteries as your heart beats. The second ("bottom") number is called the diastolic pressure. It is a measure of the pressure in your arteries when your heart relaxes between beats. Blood pressure is measured in a unit called mm Hg. Healthy blood pressure for most adults is 120/80. If your blood pressure is below 90/60, you may be diagnosed with hypotension. Other information or tests that may be used to diagnose hypotension include: Your other vital signs, such as your heart rate and temperature. Blood tests. Tilt table test. For this test, you will be safely secured to a table that moves you from a lying position to an upright position. Your heart rhythm and blood pressure will be monitored during the test. How is this treated? Treatment for this condition may include: Changing your diet. This may involve eating more salt (sodium) or drinking more water. Taking medicines to raise your blood pressure. Changing the dosage of certain medicines you are taking that might be lowering your blood pressure. Wearing compression stockings. These stockings help to prevent blood clots and reduce swelling in your legs. In some cases, you may need to go to the hospital for: Fluid replacement. This means you will receive fluids through an IV. Blood replacement. This means you will receive donated blood through an IV (transfusion). Treating an infection or heart problems, if this applies. Monitoring. You may need to be monitored while medicines that you are taking wear off. Follow these instructions at home: Eating and drinking  Drink enough fluid to keep your urine pale yellow. Eat a healthy diet, and follow instructions from your health care provider about  eating or drinking restrictions. A healthy diet includes: Fresh fruits and vegetables. Whole  grains. Lean meats. Low-fat dairy products. Eat extra salt only as directed. Do not add extra salt to your diet unless your health care provider told you to do that. Eat frequent, small meals. Avoid standing up suddenly after eating. Medicines Take over-the-counter and prescription medicines only as told by your health care provider. Follow instructions from your health care provider about changing the dosage of your current medicines, if this applies. Do not stop or adjust any of your medicines on your own. General instructions  Wear compression stockings as told by your health care provider. Get up slowly from lying down or sitting positions. This gives your blood pressure a chance to adjust. Avoid hot showers and excessive heat as directed by your health care provider. Return to your normal activities as told by your health care provider. Ask your health care provider what activities are safe for you. Do not use any products that contain nicotine or tobacco, such as cigarettes, e-cigarettes, and chewing tobacco. If you need help quitting, ask your health care provider. Keep all follow-up visits as told by your health care provider. This is important. Contact a health care provider if you: Vomit. Have diarrhea. Have a fever for more than 2-3 days. Feel more thirsty than usual. Feel weak and tired. Get help right away if you: Have chest pain. Have a fast or irregular heartbeat. Develop numbness in any part of your body. Cannot move your arms or your legs. Have trouble speaking. Become sweaty or feel light-headed. Faint. Feel short of breath. Have trouble staying awake. Feel confused. Summary Hypotension is when the force of blood pumping through your arteries is too weak. Hypotension may be harmless (benign) or may cause serious problems (be critical). Treatment for this condition may include changing your diet, changing your medicines, and wearing compression stockings. In  some cases, you may need to go to the hospital for fluid or blood replacement. This information is not intended to replace advice given to you by your health care provider. Make sure you discuss any questions you have with your health care provider. Document Revised: 04/14/2018 Document Reviewed: 04/14/2018 Elsevier Patient Education  [place].

## 2021-09-22 ENCOUNTER — Encounter: Payer: Self-pay | Admitting: Physician Assistant

## 2021-09-22 DIAGNOSIS — I951 Orthostatic hypotension: Secondary | ICD-10-CM | POA: Insufficient documentation

## 2021-09-22 DIAGNOSIS — R634 Abnormal weight loss: Secondary | ICD-10-CM | POA: Insufficient documentation

## 2021-09-22 DIAGNOSIS — R42 Dizziness and giddiness: Secondary | ICD-10-CM | POA: Insufficient documentation

## 2021-09-22 NOTE — Progress Notes (Signed)
Subjective:    Patient ID: Christy Todd, female    DOB: [birth year], 60 y.o.   MRN: [id number]  HPI Patient is a 60 year old obese female with hypertension, asthma, OSA who presents to the clinic with 3 days of hypotension.  She is recently been on a weight loss journey.  She is lost approximately 80 pounds since last year on [place].  She is lost 9 pounds within the last month.  She did have a hip replacement on 10-24/2022.  She is doing well since that hip replacement.  She is currently on lisinopril, spironolactone, metoprolol.  She has not had any major issues with this until the last 3 days.  Her blood pressure has been overall decreasing with her weight loss.  Dr. Zigmund Daniel has been slowly decreasing her medications.  She denies any chest pain, palpitations, headaches or vision changes.  When she stands she feels very dizzy and her vision goes black like she is going to pass out.  Her husband accompanies her today because she is concerned about falling.  Checking BP at home as gotten in 70s/50s.   No leg swelling, no leg pain, no SOB or cough.   .. Active Ambulatory Problems    Diagnosis Date Noted   Essential hypertension 05/07/2020   Chronic ankle pain 05/07/2020   Degenerative joint disease (DJD) of hip 05/07/2020   Seizure disorder ([place]) 05/07/2020   Hypothyroidism 05/07/2020   Mild persistent asthma 05/07/2020   Sleep behavior disorder, 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   History of seizure 07/02/2020   Obesity with alveolar hypoventilation and body mass index (BMI) of 40 or greater ([place]) 07/02/2020   Severe obstructive sleep apnea-hypopnea syndrome 07/02/2020   OSA on CPAP 07/02/2020   Morbid obesity ([place]) 11/14/2020   Primary osteoarthritis of right hip 06/06/2021   Status post total replacement of right hip 08/25/2021   Orthostatic hypotension 09/22/2021   Dizziness 09/22/2021   Weight loss 09/22/2021   Resolved Ambulatory Problems    Diagnosis Date Noted   No Resolved Ambulatory  Problems   Past Medical History:  Diagnosis Date   Arthritis    Asthma    COVID-19 07/23/2021   Depression    GERD (gastroesophageal reflux disease)    Hypertension    Hypothyroid    Pre-diabetes    Sleep apnea    Wears hearing aid in right ear        Review of Systems See HPi.     Objective:   Physical Exam Vitals reviewed.  Constitutional:      Appearance: Normal appearance. She is obese.  HENT:     Head: Normocephalic.  Neck:     Vascular: No carotid bruit.     Comments: No JVD. Cardiovascular:     Rate and Rhythm: Normal rate and regular rhythm.     Pulses: Normal pulses.     Heart sounds: Normal heart sounds.  Pulmonary:     Effort: Pulmonary effort is normal.     Breath sounds: Normal breath sounds.  Musculoskeletal:        General: No swelling or deformity.     Right lower leg: No edema.     Left lower leg: No edema.  Neurological:     General: No focal deficit present.     Mental Status: She is alert and oriented to person, place, and time.     Comments: Very dizzy and weak upon standing.   Psychiatric:        Mood and Affect: Mood  normal.          Assessment & Plan:  Marland KitchenMarland KitchenMara was seen today for hypotension and dizziness.  Diagnoses and all orders for this visit:  Dizziness  Essential hypertension  Orthostatic hypotension  Weight loss  BP is very low and pt is very symptomatic.  Stop all BP medication to include spirolactone, lisinopril and metoprolol.  Check BP if above 130/90 at back lisinopril 10mg .  Follow up in office on Wednesday.  Discussed eating some salted foods like nuts and drinking gatorade.  Discussed high fall risk but she does need to keep walking. Encouraged husband to stay with her.  No signs of DVT today.

## 2021-09-29 ENCOUNTER — Other Ambulatory Visit: Payer: Self-pay

## 2021-09-29 ENCOUNTER — Encounter: Payer: Self-pay | Admitting: Physician Assistant

## 2021-09-29 ENCOUNTER — Ambulatory Visit (INDEPENDENT_AMBULATORY_CARE_PROVIDER_SITE_OTHER): Payer: Managed Care, Other (non HMO) | Admitting: Physician Assistant

## 2021-09-29 VITALS — BP 130/63 | HR 93 | Temp 98.4°F | Ht 66.0 in | Wt 219.0 lb

## 2021-09-29 DIAGNOSIS — Z13220 Encounter for screening for lipoid disorders: Secondary | ICD-10-CM

## 2021-09-29 DIAGNOSIS — Z1159 Encounter for screening for other viral diseases: Secondary | ICD-10-CM | POA: Diagnosis not present

## 2021-09-29 DIAGNOSIS — Z114 Encounter for screening for human immunodeficiency virus [HIV]: Secondary | ICD-10-CM

## 2021-09-29 DIAGNOSIS — I10 Essential (primary) hypertension: Secondary | ICD-10-CM

## 2021-09-29 DIAGNOSIS — I951 Orthostatic hypotension: Secondary | ICD-10-CM | POA: Diagnosis not present

## 2021-09-29 DIAGNOSIS — R42 Dizziness and giddiness: Secondary | ICD-10-CM

## 2021-09-29 DIAGNOSIS — Z131 Encounter for screening for diabetes mellitus: Secondary | ICD-10-CM

## 2021-09-29 MED ORDER — LISINOPRIL 2.5 MG PO TABS
2.5000 mg | ORAL_TABLET | Freq: Every day | ORAL | 2 refills | Status: DC
Start: 2021-09-29 — End: 2021-12-24

## 2021-09-29 NOTE — Progress Notes (Signed)
Subjective:    Patient ID: Christy Todd, female    DOB: [birth year], 60 y.o.   MRN: [id number]  HPI Patient is a 60 year old female post right hip replacement with hypertension who presents to the clinic for 1 week follow-up on blood pressure.  She was seen after her blood pressure was dropping and she was becoming symptomatic with near syncope.  We stopped all blood pressure medications.  Patient has been monitoring her blood pressure at home.  She is occasionally having to take the lisinopril 10 mg when blood pressure gets above 140/90.  This is happened a few times in the last week.  It does not significantly drop when she takes the medicine and she does begin to feel better.  Overall patient is feeling much better.  She has no more dizziness when standing or syncope. Denies any CP, palpitations, headaches, leg swelling or pain.    .. Active Ambulatory Problems    Diagnosis Date Noted   Essential hypertension 05/07/2020   Chronic ankle pain 05/07/2020   Degenerative joint disease (DJD) of hip 05/07/2020   Seizure disorder ([place]) 05/07/2020   Hypothyroidism 05/07/2020   Mild persistent asthma 05/07/2020   Sleep behavior disorder, 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   History of seizure 07/02/2020   Obesity with alveolar hypoventilation and body mass index (BMI) of 40 or greater ([place]) 07/02/2020   Severe obstructive sleep apnea-hypopnea syndrome 07/02/2020   OSA on CPAP 07/02/2020   Morbid obesity ([place]) 11/14/2020   Primary osteoarthritis of right hip 06/06/2021   Status post total replacement of right hip 08/25/2021   Orthostatic hypotension 09/22/2021   Dizziness 09/22/2021   Weight loss 09/22/2021   Resolved Ambulatory Problems    Diagnosis Date Noted   No Resolved Ambulatory Problems   Past Medical History:  Diagnosis Date   Arthritis    Asthma    COVID-19 07/23/2021   Depression    GERD (gastroesophageal reflux disease)    Hypertension    Hypothyroid    Pre-diabetes    Sleep apnea     Wears hearing aid in right ear     Review of Systems See HPI.     Objective:   Physical Exam Vitals reviewed.  Constitutional:      Appearance: Normal appearance.  HENT:     Head: Normocephalic.  Cardiovascular:     Rate and Rhythm: Normal rate and regular rhythm.     Pulses: Normal pulses.  Pulmonary:     Effort: Pulmonary effort is normal.  Musculoskeletal:     Comments: Able to ambulate without walker  Neurological:     General: No focal deficit present.     Mental Status: She is alert and oriented to person, place, and time.  Psychiatric:        Mood and Affect: Mood normal.          Assessment & Plan:  Marland KitchenMarland KitchenRoanna was seen today for hypotension and dizziness.  Diagnoses and all orders for this visit:  Orthostatic hypotension  Essential hypertension -     lisinopril (ZESTRIL) 2.5 MG tablet; Take 1 tablet (2.5 mg total) by mouth daily. -     Lipid Panel w/reflex Direct LDL -     TSH -     COMPLETE METABOLIC PANEL WITH GFR -     CBC with Differential/Platelet  Dizziness  Need for hepatitis C screening test  Screening for HIV (human immunodeficiency virus)  Screening for lipid disorders -     Lipid Panel w/reflex Direct  LDL  Screening for diabetes mellitus -     COMPLETE METABOLIC PANEL WITH GFR  Pt is doing much better.  Dizziness and near syncope resolved.  Reviewed log. Some readings are above 140/90.  Start lisinopril 2.5mg  daily.  Continue to monitor BP and symptoms at home.  Follow up with PcP.  Fasting labs ordered.

## 2021-10-02 LAB — TSH: TSH: 0.11 mIU/L — ABNORMAL LOW (ref 0.40–4.50)

## 2021-10-02 LAB — CBC WITH DIFFERENTIAL/PLATELET
Absolute Monocytes: 382 cells/uL (ref 200–950)
Basophils Absolute: 58 cells/uL (ref 0–200)
Basophils Relative: 0.8 %
Eosinophils Absolute: 374 cells/uL (ref 15–500)
Eosinophils Relative: 5.2 %
HCT: 36.2 % (ref 35.0–45.0)
Hemoglobin: 11.8 g/dL (ref 11.7–15.5)
Lymphs Abs: 1512 cells/uL (ref 850–3900)
MCH: 28.4 pg (ref 27.0–33.0)
MCHC: 32.6 g/dL (ref 32.0–36.0)
MCV: 87 fL (ref 80.0–100.0)
MPV: 10.8 fL (ref 7.5–12.5)
Monocytes Relative: 5.3 %
Neutro Abs: 4874 cells/uL (ref 1500–7800)
Neutrophils Relative %: 67.7 %
Platelets: 323 10*3/uL (ref 140–400)
RBC: 4.16 10*6/uL (ref 3.80–5.10)
RDW: 12.6 % (ref 11.0–15.0)
Total Lymphocyte: 21 %
WBC: 7.2 10*3/uL (ref 3.8–10.8)

## 2021-10-02 LAB — COMPLETE METABOLIC PANEL WITH GFR
AG Ratio: 1.4 (calc) (ref 1.0–2.5)
ALT: 16 U/L (ref 6–29)
AST: 17 U/L (ref 10–35)
Albumin: 3.9 g/dL (ref 3.6–5.1)
Alkaline phosphatase (APISO): 77 U/L (ref 37–153)
BUN: 17 mg/dL (ref 7–25)
CO2: 28 mmol/L (ref 20–32)
Calcium: 9.6 mg/dL (ref 8.6–10.4)
Chloride: 104 mmol/L (ref 98–110)
Creat: 0.86 mg/dL (ref 0.50–1.03)
Globulin: 2.7 g/dL (calc) (ref 1.9–3.7)
Glucose, Bld: 86 mg/dL (ref 65–99)
Potassium: 4.2 mmol/L (ref 3.5–5.3)
Sodium: 141 mmol/L (ref 135–146)
Total Bilirubin: 0.4 mg/dL (ref 0.2–1.2)
Total Protein: 6.6 g/dL (ref 6.1–8.1)
eGFR: 78 mL/min/{1.73_m2} (ref >=60)

## 2021-10-02 LAB — LIPID PANEL W/REFLEX DIRECT LDL
Cholesterol: 175 mg/dL (ref <200)
HDL: 52 mg/dL (ref >=50)
LDL Cholesterol (Calc): 104 mg/dL (calc) — ABNORMAL HIGH
Non-HDL Cholesterol (Calc): 123 mg/dL (calc) (ref <130)
Total CHOL/HDL Ratio: 3.4 (calc) (ref <5.0)
Triglycerides: 95 mg/dL (ref <150)

## 2021-10-03 ENCOUNTER — Other Ambulatory Visit: Payer: Self-pay | Admitting: Family Medicine

## 2021-10-03 ENCOUNTER — Other Ambulatory Visit: Payer: Self-pay

## 2021-10-03 ENCOUNTER — Encounter: Payer: Self-pay | Admitting: Family Medicine

## 2021-10-03 ENCOUNTER — Ambulatory Visit (INDEPENDENT_AMBULATORY_CARE_PROVIDER_SITE_OTHER): Payer: Managed Care, Other (non HMO) | Admitting: Family Medicine

## 2021-10-03 VITALS — BP 132/66 | HR 93 | Temp 97.8°F | Ht 66.0 in | Wt 218.0 lb

## 2021-10-03 DIAGNOSIS — E063 Autoimmune thyroiditis: Secondary | ICD-10-CM | POA: Diagnosis not present

## 2021-10-03 DIAGNOSIS — I10 Essential (primary) hypertension: Secondary | ICD-10-CM

## 2021-10-03 DIAGNOSIS — Z23 Encounter for immunization: Secondary | ICD-10-CM

## 2021-10-03 DIAGNOSIS — E038 Other specified hypothyroidism: Secondary | ICD-10-CM

## 2021-10-03 MED ORDER — LEVOTHYROXINE SODIUM 125 MCG PO TABS
125.0000 ug | ORAL_TABLET | Freq: Every day | ORAL | 1 refills | Status: DC
Start: 2021-10-03 — End: 2022-03-31

## 2021-10-03 MED ORDER — WEGOVY 2.4 MG/0.75ML ~~LOC~~ SOAJ: 2.4000 mg | SUBCUTANEOUS | 3 refills | Status: DC

## 2021-10-03 NOTE — Patient Instructions (Addendum)
Great to see you today! Try adding miralax or benefiber daily.  Be sure to drink plenty of fluid.  Decrease levothyroxine to 124mcg daily.  Recheck labs in 6-8 weeks. Call for nurse visit to have Shingrix #2 after the first of the year.  Follow up with me in 4 months.
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
## 2021-10-03 NOTE — Progress Notes (Signed)
To PcP.

## 2021-10-04 ENCOUNTER — Encounter: Payer: Self-pay | Admitting: Family Medicine

## 2021-10-05 ENCOUNTER — Encounter: Payer: Self-pay | Admitting: Family Medicine

## 2021-10-05 NOTE — Assessment & Plan Note (Signed)
Recent TSH was suppressed.  Reducing levothyroxine on to 125 mcg.  Repeat TSH and 6 to 8 weeks.

## 2021-10-05 NOTE — Assessment & Plan Note (Signed)
Recently with hypotension likely related to her recent weight loss.  This is improved with discontinuing spironolactone.  She will continue lisinopril 2.5 mg.

## 2021-10-05 NOTE — Assessment & Plan Note (Signed)
Continues to do well with [facility] for management of weight.

## 2021-10-07 ENCOUNTER — Telehealth: Payer: Self-pay

## 2021-10-07 NOTE — Telephone Encounter (Signed)
Medication: WEGOVY 2.4 MG/0.75ML SOAJ Prior authorization submitted via CoverMyMeds on 10/07/2021 [place] submission pending

## 2021-10-09 NOTE — Telephone Encounter (Signed)
Medication: WEGOVY 2.4 MG/0.75ML SOAJ Prior authorization determination received Medication has been approved Approval dates: 10/09/2021-10/09/2022  Patient aware via: [place] aware: Yes Provider aware via this encounter

## 2021-10-10 ENCOUNTER — Encounter: Payer: Self-pay | Admitting: Orthopaedic Surgery

## 2021-10-10 ENCOUNTER — Other Ambulatory Visit: Payer: Self-pay

## 2021-10-10 ENCOUNTER — Ambulatory Visit (INDEPENDENT_AMBULATORY_CARE_PROVIDER_SITE_OTHER): Payer: Managed Care, Other (non HMO)

## 2021-10-10 ENCOUNTER — Ambulatory Visit (INDEPENDENT_AMBULATORY_CARE_PROVIDER_SITE_OTHER): Payer: Managed Care, Other (non HMO) | Admitting: Orthopaedic Surgery

## 2021-10-10 DIAGNOSIS — Z96641 Presence of right artificial hip joint: Secondary | ICD-10-CM

## 2021-10-10 NOTE — Progress Notes (Signed)
Post-Op Visit Note   Patient: Christy Todd           Date of Birth: 06/23/1961           MRN: [id number] Visit Date: 10/10/2021 PCP: Luetta Nutting, DO   Assessment & Plan:  Chief Complaint:  Chief Complaint  Patient presents with   Right Hip - Follow-up    Right THA  08/25/2021   Visit Diagnoses:  1. Status post total replacement of right hip     Plan: Chonte is 6 weeks status post right total hip replacement.  She is doing very well and very happy and has no complaints.  She has expected occasional soreness after activity.  She is going to outpatient PT twice a week and she plans on doing this until the end of the year.  Right hip scar is healed.  No signs of infection.  Excellent range of motion without pain.  Strength is coming along.  She is ambulating without any assistive devices.  X-rays show stable total hip replacement without any complications.  Evonna is doing very well for 6-week appointment.  Handicap placard renewed today.  She may discontinue the aspirin.  Dental prophylaxis reinforced.  Follow-up in 6 weeks for recheck.  Follow-Up Instructions: Return in about 6 weeks (around 11/21/2021).   Orders:  Orders Placed This Encounter  Procedures   XR Pelvis 1-2 Views   No orders of the defined types were placed in this encounter.   Imaging: No results found.  PMFS History: Patient Active Problem List   Diagnosis Date Noted   Orthostatic hypotension 09/22/2021   Dizziness 09/22/2021   Weight loss 09/22/2021   Status post total replacement of right hip 08/25/2021   Primary osteoarthritis of right hip 06/06/2021   Morbid obesity ([place]) 11/14/2020   Sleep behavior disorder, 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   History of seizure 07/02/2020   Obesity with alveolar hypoventilation and body mass index (BMI) of 40 or greater ([place]) 07/02/2020   Severe obstructive sleep apnea-hypopnea syndrome 07/02/2020   OSA on CPAP 07/02/2020   Essential hypertension 05/07/2020   Chronic ankle  pain 05/07/2020   Degenerative joint disease (DJD) of hip 05/07/2020   Seizure disorder ([place]) 05/07/2020   Hypothyroidism 05/07/2020   Mild persistent asthma 05/07/2020   Past Medical History:  Diagnosis Date   Arthritis    Asthma    COVID-19 07/23/2021   Depression    GERD (gastroesophageal reflux disease)    Hypertension    Hypothyroid    Pre-diabetes    Sleep apnea    on CPAP   Wears hearing aid in right ear     Family History  Problem Relation Age of Onset   Hypertension Father    Pancreatic cancer Father    Diabetes Maternal Grandmother    Colon cancer Neg Hx    Stomach cancer Neg Hx    Esophageal cancer Neg Hx     Past Surgical History:  Procedure Laterality Date   ABDOMINAL HYSTERECTOMY     ANKLE RECONSTRUCTION     CHOLECYSTECTOMY     COLONOSCOPY  12/09/2018   [facility].  3 mm sessile polyp, hepatic flexure. Mild - Moderate diverticulosis, sigmoid colon.   CYSTOCELE REPAIR     ESOPHAGOGASTRODUODENOSCOPY  12/09/2018   Dr[facility]. Normal upper GI tract   RECTOCELE REPAIR     TENDON REPAIR     TOTAL ABDOMINAL HYSTERECTOMY     TOTAL HIP ARTHROPLASTY  Right 08/25/2021   Procedure: RIGHT TOTAL HIP ARTHROPLASTY ANTERIOR APPROACH;  Surgeon: Leandrew Koyanagi, MD;  Location: [place];  Service: Orthopedics;  Laterality: Right;  3-C   Social History   Occupational History   Not on file  Tobacco Use   Smoking status: Never   Smokeless tobacco: Never  Vaping Use   Vaping Use: Never used  Substance and Sexual Activity   Alcohol use: Yes    Alcohol/week: 1.0 standard drink    Types: 1 Standard drinks or equivalent per week    Comment: Rarely   Drug use: Never   Sexual activity: Yes    Partners: Male

## 2021-10-15 ENCOUNTER — Other Ambulatory Visit: Payer: Self-pay | Admitting: Family Medicine

## 2021-10-17 ENCOUNTER — Encounter: Payer: Self-pay | Admitting: Family Medicine

## 2021-10-22 ENCOUNTER — Encounter: Payer: Self-pay | Admitting: Family Medicine

## 2021-11-07 ENCOUNTER — Other Ambulatory Visit: Payer: Self-pay

## 2021-11-07 ENCOUNTER — Ambulatory Visit (INDEPENDENT_AMBULATORY_CARE_PROVIDER_SITE_OTHER): Payer: Managed Care, Other (non HMO) | Admitting: Family Medicine

## 2021-11-07 VITALS — Temp 97.8°F

## 2021-11-07 DIAGNOSIS — Z23 Encounter for immunization: Secondary | ICD-10-CM

## 2021-11-07 MED ORDER — CYANOCOBALAMIN 1000 MCG/ML IJ SOLN: 1000.0000 ug | Freq: Once | INTRAMUSCULAR | Status: DC

## 2021-11-07 NOTE — Progress Notes (Signed)
Established Patient Office Visit  Subjective:  Patient ID: Christy Todd, female    DOB: 1960-12-30  Age: 61 y.o. MRN: [id number]  CC:  Chief Complaint  Patient presents with   Immunizations    HPI Christy Todd presents for shingles vaccine.   Past Medical History:  Diagnosis Date   Arthritis    Asthma    COVID-19 07/23/2021   Depression    GERD (gastroesophageal reflux disease)    Hypertension    Hypothyroid    Pre-diabetes    Sleep apnea    on CPAP   Wears hearing aid in right ear     Past Surgical History:  Procedure Laterality Date   ABDOMINAL HYSTERECTOMY     ANKLE RECONSTRUCTION     CHOLECYSTECTOMY     COLONOSCOPY  12/09/2018   [facility].  3 mm sessile polyp, hepatic flexure. Mild - Moderate diverticulosis, sigmoid colon.   CYSTOCELE REPAIR     ESOPHAGOGASTRODUODENOSCOPY  12/09/2018   [facility]. Normal upper GI tract   RECTOCELE REPAIR     TENDON REPAIR     TOTAL ABDOMINAL HYSTERECTOMY     TOTAL HIP ARTHROPLASTY Right 08/25/2021   Procedure: RIGHT TOTAL HIP ARTHROPLASTY ANTERIOR APPROACH;  Surgeon: Leandrew Koyanagi, MD;  Location: [place];  Service: Orthopedics;  Laterality: Right;  3-C    Family History  Problem Relation Age of Onset   Hypertension Father    Pancreatic cancer Father    Diabetes Maternal Grandmother    Colon cancer Neg Hx    Stomach cancer Neg Hx    Esophageal cancer Neg Hx     Social History   Socioeconomic History   Marital status: Married    Spouse name: Marylene Masek   Number of children: Not on file   Years of education: Not on file   Highest education level: Not on file  Occupational History   Not on file  Tobacco Use   Smoking status: Never   Smokeless tobacco: Never  Vaping Use   Vaping Use: Never used  Substance and Sexual Activity   Alcohol use: Yes    Alcohol/week: 1.0 standard drink    Types: 1 Standard drinks or equivalent per  week    Comment: Rarely   Drug use: Never   Sexual activity: Yes    Partners: Male  Other Topics Concern   Not on file  Social History Narrative   Not on file   Social Determinants of Health   Financial Resource Strain: Not on file  Food Insecurity: Not on file  Transportation Needs: Not on file  Physical Activity: Not on file  Stress: Not on file  Social Connections: Not on file  Intimate Partner Violence: Not on file    Outpatient Medications Prior to Visit  Medication Sig Dispense Refill   albuterol (VENTOLIN HFA) 108 (90 Base) MCG/ACT inhaler Inhale 2 puffs into the lungs every 6 (six) hours as needed for wheezing or shortness of breath. 8 g 0   aspirin EC 81 MG tablet Take 1 tablet (81 mg total) by mouth 2 (two) times daily. To be taken after surgery to prevent blood clots 84 tablet 0   Cholecalciferol (VITAMIN D3) 50 MCG (2000 UT) TABS Take 2,000 Units by mouth daily.     docusate sodium (COLACE) 100 MG capsule Take 1 capsule (100 mg total) by mouth daily as needed. 30 capsule 2   DULoxetine (CYMBALTA) 30 MG  capsule 2 BY MOUTH EVERY MORNING, 1 BY MOUTH EVERY AT NIGHT 270 capsule 2   levothyroxine (SYNTHROID) 125 MCG tablet Take 1 tablet (125 mcg total) by mouth daily before breakfast. 90 tablet 1   lisinopril (ZESTRIL) 2.5 MG tablet Take 1 tablet (2.5 mg total) by mouth daily. 30 tablet 2   Magnesium 200 MG TABS Take by mouth.     montelukast (SINGULAIR) 10 MG tablet TAKE 1 TABLET BY MOUTH EVERY DAY 90 tablet 1   nortriptyline (PAMELOR) 25 MG capsule TAKE 1 CAPSULE (25 MG TOTAL) BY MOUTH AT BEDTIME AS NEEDED. 90 capsule 3   omeprazole (PRILOSEC) 20 MG capsule Take 20 mg by mouth daily.     spironolactone (ALDACTONE) 50 MG tablet TAKE 1 TABLET BY MOUTH EVERY DAY 90 tablet 1   WEGOVY 2.4 MG/0.75ML SOAJ Inject 2.4 mg into the skin once a week. 9 mL 3   No facility-administered medications prior to visit.    Allergies  Allergen Reactions   Gluten Meal Other (See Comments)     Gut Distress   Apple     Chemical burn to mouth and gums   Ciprofloxacin     Detatched muscles and tendons    Claritin [Loratadine]     allergic psychosis   Septra [Sulfamethoxazole-Trimethoprim] Rash    ROS Review of Systems    Objective:    Physical Exam  Temp 97.8 F (36.6 C) (Temporal)    LMP 11/02/1993  Wt Readings from Last 3 Encounters:  10/03/21 218 lb (98.9 kg)  09/29/21 219 lb (99.3 kg)  09/19/21 212 lb (96.2 kg)     Health Maintenance Due  Topic Date Due   Hepatitis C Screening  Never done    There are no preventive care reminders to display for this patient.  Lab Results  Component Value Date   TSH 0.11 (L) 10/01/2021   Lab Results  Component Value Date   WBC 7.2 10/01/2021   HGB 11.8 10/01/2021   HCT 36.2 10/01/2021   MCV 87.0 10/01/2021   PLT 323 10/01/2021   Lab Results  Component Value Date   NA 141 10/01/2021   K 4.2 10/01/2021   CO2 28 10/01/2021   GLUCOSE 86 10/01/2021   BUN 17 10/01/2021   CREATININE 0.86 10/01/2021   BILITOT 0.4 10/01/2021   ALKPHOS 77 08/22/2021   AST 17 10/01/2021   ALT 16 10/01/2021   PROT 6.6 10/01/2021   ALBUMIN 3.5 08/22/2021   CALCIUM 9.6 10/01/2021   ANIONGAP 4 (L) 08/26/2021   EGFR 78 10/01/2021   Lab Results  Component Value Date   CHOL 175 10/01/2021   Lab Results  Component Value Date   HDL 52 10/01/2021   Lab Results  Component Value Date   LDLCALC 104 (H) 10/01/2021   Lab Results  Component Value Date   TRIG 95 10/01/2021   Lab Results  Component Value Date   CHOLHDL 3.4 10/01/2021   No results found for: HGBA1C    Assessment & Plan:  Shingles vaccine - Patient tolerated injection well without complications.   Problem List Items Addressed This Visit   None Visit Diagnoses     Need for shingles vaccine    -  Primary   Relevant Orders   Varicella-zoster vaccine IM (Shingrix) (Completed)       Meds ordered this encounter  Medications   DISCONTD: cyanocobalamin  ((VITAMIN B-12)) injection 1,000 mcg    Follow-up: No follow-ups on file.    Durene Romans,  Monico Blitz, CMA

## 2021-11-07 NOTE — Progress Notes (Signed)
Medical screening examination/treatment was performed by qualified clinical staff member and as supervising physician I was immediately available for consultation/collaboration. I have reviewed documentation and agree with assessment and plan.  Merrill Villarruel, DO  

## 2021-11-20 ENCOUNTER — Other Ambulatory Visit: Payer: Self-pay | Admitting: Family Medicine

## 2021-11-21 ENCOUNTER — Encounter: Payer: Self-pay | Admitting: Orthopaedic Surgery

## 2021-11-21 ENCOUNTER — Ambulatory Visit (INDEPENDENT_AMBULATORY_CARE_PROVIDER_SITE_OTHER): Payer: Managed Care, Other (non HMO) | Admitting: Orthopaedic Surgery

## 2021-11-21 ENCOUNTER — Other Ambulatory Visit: Payer: Self-pay

## 2021-11-21 VITALS — Ht 66.0 in | Wt 205.0 lb

## 2021-11-21 DIAGNOSIS — M1612 Unilateral primary osteoarthritis, left hip: Secondary | ICD-10-CM | POA: Insufficient documentation

## 2021-11-21 DIAGNOSIS — Z96641 Presence of right artificial hip joint: Secondary | ICD-10-CM

## 2021-11-21 MED ORDER — AMOXICILLIN 500 MG PO CAPS: 2000.0000 mg | ORAL_CAPSULE | Freq: Once | ORAL | 6 refills | Status: AC

## 2021-11-21 NOTE — Progress Notes (Signed)
Post-Op Visit Note   Patient: Christy Todd           Date of Birth: 08/24/1961           MRN: [id number] Visit Date: 11/21/2021 PCP: Luetta Nutting, DO   Assessment & Plan:  Chief Complaint:  Chief Complaint  Patient presents with   Right Hip - Follow-up    Right total hip arthroplasty 08/25/2021   Visit Diagnoses:  1. Status post total replacement of right hip   2. Primary osteoarthritis of left hip     Plan: Christy Todd is 3 months status post right total hip replacement.  She is doing very well and very happy.  She is mainly limited by her left hip when it comes to activity and mobility.  Right hip scar is fully healed.  Normal gait and ambulation with the right leg.  Slight limp overall due to the left leg and hip.  Care has done very well from her surgery.  She is very pleased.  She is up and have a left hip replacement in June.  She will follow-up with [place] in approximately 3 months to get a repeat x-rays of the pelvis as well as to schedule surgery for the left hip replacement in June.  Dental prophylaxis reinforced.  Amoxicillin sent in today.  Follow-Up Instructions: Return in about 3 months (around 02/19/2022).   Orders:  No orders of the defined types were placed in this encounter.  Meds ordered this encounter  Medications   amoxicillin (AMOXIL) 500 MG capsule    Sig: Take 4 capsules (2,000 mg total) by mouth once for 1 dose.    Dispense:  4 capsule    Refill:  6    Imaging: No results found.  PMFS History: Patient Active Problem List   Diagnosis Date Noted   Primary osteoarthritis of left hip 11/21/2021   Orthostatic hypotension 09/22/2021   Dizziness 09/22/2021   Weight loss 09/22/2021   Status post total replacement of right hip 08/25/2021   Primary osteoarthritis of right hip 06/06/2021   Morbid obesity ([place]) 11/14/2020   Sleep behavior disorder, 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   History of seizure 07/02/2020   Obesity with alveolar hypoventilation and body mass index  (BMI) of 40 or greater ([place]) 07/02/2020   Severe obstructive sleep apnea-hypopnea syndrome 07/02/2020   OSA on CPAP 07/02/2020   Essential hypertension 05/07/2020   Chronic ankle pain 05/07/2020   Degenerative joint disease (DJD) of hip 05/07/2020   Seizure disorder ([place]) 05/07/2020   Hypothyroidism 05/07/2020   Mild persistent asthma 05/07/2020   Past Medical History:  Diagnosis Date   Arthritis    Asthma    COVID-19 07/23/2021   Depression    GERD (gastroesophageal reflux disease)    Hypertension    Hypothyroid    Pre-diabetes    Sleep apnea    on CPAP   Wears hearing aid in right ear     Family History  Problem Relation Age of Onset   Hypertension Father    Pancreatic cancer Father    Diabetes Maternal Grandmother    Colon cancer Neg Hx    Stomach cancer Neg Hx    Esophageal cancer Neg Hx     Past Surgical History:  Procedure Laterality Date   ABDOMINAL HYSTERECTOMY     ANKLE RECONSTRUCTION     CHOLECYSTECTOMY     COLONOSCOPY  12/09/2018   [facility].  3 mm sessile polyp, hepatic flexure. Mild - Moderate  diverticulosis, sigmoid colon.   CYSTOCELE REPAIR     ESOPHAGOGASTRODUODENOSCOPY  12/09/2018   Dr[facility]. Normal upper GI tract   RECTOCELE REPAIR     TENDON REPAIR     TOTAL ABDOMINAL HYSTERECTOMY     TOTAL HIP ARTHROPLASTY Right 08/25/2021   Procedure: RIGHT TOTAL HIP ARTHROPLASTY ANTERIOR APPROACH;  Surgeon: Leandrew Koyanagi, MD;  Location: [facility];  Service: Orthopedics;  Laterality: Right;  3-C   Social History   Occupational History   Not on file  Tobacco Use   Smoking status: Never   Smokeless tobacco: Never  Vaping Use   Vaping Use: Never used  Substance and Sexual Activity   Alcohol use: Yes    Alcohol/week: 1.0 standard drink    Types: 1 Standard drinks or equivalent per week    Comment: Rarely   Drug use: Never   Sexual activity: Yes    Partners: Male

## 2021-11-24 ENCOUNTER — Other Ambulatory Visit: Payer: Self-pay | Admitting: Family Medicine

## 2021-12-10 ENCOUNTER — Encounter: Payer: Self-pay | Admitting: Family Medicine

## 2021-12-14 ENCOUNTER — Other Ambulatory Visit: Payer: Self-pay | Admitting: Family Medicine

## 2021-12-24 ENCOUNTER — Other Ambulatory Visit: Payer: Self-pay | Admitting: Physician Assistant

## 2021-12-24 DIAGNOSIS — I10 Essential (primary) hypertension: Secondary | ICD-10-CM

## 2021-12-25 ENCOUNTER — Other Ambulatory Visit: Payer: Self-pay

## 2022-02-13 ENCOUNTER — Ambulatory Visit (INDEPENDENT_AMBULATORY_CARE_PROVIDER_SITE_OTHER): Payer: Managed Care, Other (non HMO)

## 2022-02-13 ENCOUNTER — Ambulatory Visit (INDEPENDENT_AMBULATORY_CARE_PROVIDER_SITE_OTHER): Payer: Managed Care, Other (non HMO) | Admitting: Family Medicine

## 2022-02-13 ENCOUNTER — Encounter: Payer: Self-pay | Admitting: Family Medicine

## 2022-02-13 VITALS — BP 117/76 | HR 87 | Ht 66.0 in | Wt 185.0 lb

## 2022-02-13 DIAGNOSIS — M898X1 Other specified disorders of bone, shoulder: Secondary | ICD-10-CM

## 2022-02-13 DIAGNOSIS — I10 Essential (primary) hypertension: Secondary | ICD-10-CM

## 2022-02-13 DIAGNOSIS — J019 Acute sinusitis, unspecified: Secondary | ICD-10-CM | POA: Insufficient documentation

## 2022-02-13 DIAGNOSIS — M25511 Pain in right shoulder: Secondary | ICD-10-CM

## 2022-02-13 DIAGNOSIS — E038 Other specified hypothyroidism: Secondary | ICD-10-CM

## 2022-02-13 DIAGNOSIS — J0100 Acute maxillary sinusitis, unspecified: Secondary | ICD-10-CM

## 2022-02-13 DIAGNOSIS — E063 Autoimmune thyroiditis: Secondary | ICD-10-CM

## 2022-02-13 MED ORDER — AMOXICILLIN-POT CLAVULANATE 875-125 MG PO TABS: 1.0000 | ORAL_TABLET | Freq: Two times a day (BID) | ORAL | 0 refills | Status: DC

## 2022-02-13 NOTE — Progress Notes (Signed)
?Christy Todd - 61 y.o. female MRN [id number]  Date of birth: 1961-06-13 ? ?Subjective ?Chief Complaint  ?Patient presents with  ? Weight Check  ? ? ?HPI ?PORSHIA Todd is a 61 y.o. female here today for follow up visit.  She reports she is doing well.  She continues to tolerate [place] well at current strength.  Weight is down an additional 20lbs since last visit.   ? ?She has noted some swelling along the R collar bone with some pain.  Denies any recent injury.  She feels like this radiates up into her neck.  She does not have any neck swelling.   ? ?She has also had some sinus congestion for several days.  This is worsening and mucus has become more thickened and darker.   ? ?ROS:  A comprehensive ROS was completed and negative except as noted per HPI ? ?Allergies  ?Allergen Reactions  ? Ciprofloxacin Other (See Comments)  ?  Detatched muscles and tendons   ? Apple Juice   ?  Chemical burn to mouth and gums  ? Gluten Meal Other (See Comments)  ?  Gut Distress  ? Claritin [Loratadine] Other (See Comments)  ?  allergic psychosis  ? Septra [Sulfamethoxazole-Trimethoprim] Rash  ? ? ?Past Medical History:  ?Diagnosis Date  ? Arthritis   ? Asthma   ? COVID-19 07/23/2021  ? Depression   ? GERD (gastroesophageal reflux disease)   ? Hypertension   ? Hypothyroid   ? Pre-diabetes   ? Sleep apnea   ? on CPAP  ? Wears hearing aid in right ear   ? ? ?Past Surgical History:  ?Procedure Laterality Date  ? ABDOMINAL HYSTERECTOMY    ? ANKLE RECONSTRUCTION    ? CHOLECYSTECTOMY    ? COLONOSCOPY  12/09/2018  ? Dr. [facility].  3 mm sessile polyp, hepatic flexure. Mild - Moderate diverticulosis, sigmoid colon.  ? CYSTOCELE REPAIR    ? ESOPHAGOGASTRODUODENOSCOPY  12/09/2018  ? Dr. [facility]ine [facility]. Normal upper GI tract  ? RECTOCELE REPAIR    ? TENDON REPAIR    ? TOTAL ABDOMINAL HYSTERECTOMY    ? TOTAL HIP ARTHROPLASTY Right 08/25/2021  ? Procedure: RIGHT TOTAL HIP  ARTHROPLASTY ANTERIOR APPROACH;  Surgeon: Leandrew Koyanagi, MD;  Location: [place];  Service: Orthopedics;  Laterality: Right;  3-C  ? ? ?Social History  ? ?Socioeconomic History  ? Marital status: Married  ?  Spouse name: Fronnie Urton  ? Number of children: Not on file  ? Years of education: Not on file  ? Highest education level: Not on file  ?Occupational History  ? Not on file  ?Tobacco Use  ? Smoking status: Never  ? Smokeless tobacco: Never  ?Vaping Use  ? Vaping Use: Never used  ?Substance and Sexual Activity  ? Alcohol use: Yes  ?  Alcohol/week: 1.0 standard drink  ?  Types: 1 Standard drinks or equivalent per week  ?  Comment: Rarely  ? Drug use: Never  ? Sexual activity: Yes  ?  Partners: Male  ?Other Topics Concern  ? Not on file  ?Social History Narrative  ? Not on file  ? ?Social Determinants of Health  ? ?Financial Resource Strain: Not on file  ?Food Insecurity: Not on file  ?Transportation Needs: Not on file  ?Physical Activity: Not on file  ?Stress: Not on file  ?Social Connections: Not on file  ? ? ?Family History  ?Problem Relation Age of  Onset  ? Hypertension Father   ? Pancreatic cancer Father   ? Diabetes Maternal Grandmother   ? Colon cancer Neg Hx   ? Stomach cancer Neg Hx   ? Esophageal cancer Neg Hx   ? ? ?Health Maintenance  ?Topic Date Due  ? COVID-19 Vaccine (5 - Booster for [place] series) 07/03/2022 (Originally 12/15/2021)  ? MAMMOGRAM  09/29/2022 (Originally 07/10/2012)  ? Hepatitis C Screening  02/14/2023 (Originally 07/10/1980)  ? INFLUENZA VACCINE  06/02/2022  ? TETANUS/TDAP  08/27/2030  ? COLONOSCOPY (Pts 45-16yr Insurance coverage will need to be confirmed)  07/05/2031  ? HIV Screening  Completed  ? Zoster Vaccines- Shingrix  Completed  ? HPV VACCINES  Aged Out  ? PAP SMEAR-Modifier  Discontinued   ? ? ? ?----------------------------------------------------------------------------------------------------------------------------------------------------------------------------------------------------------------- ?Physical Exam ?BP 117/76 (BP Location: Right Arm, Patient Position: Sitting, Cuff Size: Large)   Pulse 87   Ht '5\' 6"'$  (1.676 m)   Wt 185 lb (83.9 kg)   LMP 11/02/1993   SpO2 97%   BMI 29.86 kg/m?  ? ?Physical Exam ?Constitutional:   ?   Appearance: Normal appearance.  ?Eyes:  ?   General: No scleral icterus. ?Neck:  ?   Comments: TTP along mid clavicle with some swelling and prominence along the R [place] joint ?Cardiovascular:  ?   Rate and Rhythm: Normal rate and regular rhythm.  ?Pulmonary:  ?   Effort: Pulmonary effort is normal.  ?   Breath sounds: Normal breath sounds.  ?Neurological:  ?   Mental Status: She is alert.  ? ? ?------------------------------------------------------------------------------------------------------------------------------------------------------------------------------------------------------------------- ?Assessment and Plan ? ?Clavicle pain ?Xrays of clavicle ordered. If this is unremarkable we discussed [place] soft tissue surrounding area.   ? ?Hypothyroidism ?Updated TSH ordered.  ? ?Morbid obesity ([place] ?[facility]  Will continue at current strength.   ? ?Essential hypertension ?BP remains well controlled.  Continue current medications for management of HTN.  ? ?Acute sinusitis ?Rx for augmentin sent in.  Additional symptomatic management and increased fluids recommended.  ? ? ?No orders of the defined types were placed in this encounter. ? ? ?No follow-ups on file. ? ? ? ?This visit occurred during the SARS-CoV-2 public health emergency.  Safety protocols were in place, including screening questions prior to the visit, additional usage of staff PPE, and extensive cleaning of exam room while observing  appropriate contact time as indicated for disinfecting solutions.  ? ?

## 2022-02-13 NOTE — Assessment & Plan Note (Signed)
Updated TSH ordered.  ?

## 2022-02-13 NOTE — Assessment & Plan Note (Signed)
Xrays of clavicle ordered. If this is unremarkable we discussed [place] to evaluate soft tissue surrounding area.   ?

## 2022-02-13 NOTE — Assessment & Plan Note (Signed)
BP remains well controlled.  Continue current medications for management of HTN.   

## 2022-02-13 NOTE — Assessment & Plan Note (Signed)
She has done remarkably well with [facility] for weight management.  Will continue at current strength.   ?

## 2022-02-13 NOTE — Assessment & Plan Note (Signed)
Rx for augmentin sent in.  Additional symptomatic management and increased fluids recommended.  ?

## 2022-02-14 LAB — TSH: TSH: 0.58 mIU/L (ref 0.40–4.50)

## 2022-02-16 ENCOUNTER — Encounter: Payer: Self-pay | Admitting: Family Medicine

## 2022-02-16 DIAGNOSIS — M954 Acquired deformity of chest and rib: Secondary | ICD-10-CM

## 2022-02-16 NOTE — Telephone Encounter (Signed)
Orders entered

## 2022-02-20 ENCOUNTER — Encounter: Payer: Self-pay | Admitting: Orthopaedic Surgery

## 2022-02-20 ENCOUNTER — Ambulatory Visit (INDEPENDENT_AMBULATORY_CARE_PROVIDER_SITE_OTHER): Payer: Managed Care, Other (non HMO)

## 2022-02-20 ENCOUNTER — Ambulatory Visit (INDEPENDENT_AMBULATORY_CARE_PROVIDER_SITE_OTHER): Payer: Managed Care, Other (non HMO) | Admitting: Orthopaedic Surgery

## 2022-02-20 DIAGNOSIS — Z96641 Presence of right artificial hip joint: Secondary | ICD-10-CM

## 2022-02-20 NOTE — Progress Notes (Signed)
? ?Office Visit Note ?  ?Patient: Christy Todd           ?Date of Birth: 1961-02-07           ?MRN: [id number] ?Visit Date: 02/20/2022 ?             ?Requested by: Luetta Nutting, DO ?[address]  ?[address] ?[place],  [place] [address] ?PCP: Luetta Nutting, DO ? ? ?Assessment & Plan: ?Visit Diagnoses:  ?1. Status post total replacement of right hip   ? ? ?Plan: [name] is 76-monthstatus post right total hip replacement.  She is doing well has no complaints.  She is very happy and eager to have her left hip replaced which is scheduled for June 1. ? ?Examination of right hip shows a fully healed surgical scar.  Excellent functional range of motion. ? ?[name] now 6 months from the right hip replacement and has done very well.  She has lost over 100 pounds in the last year or so.  She her only limitation is her left hip which is scheduled to be replaced on June 1.  She has no complaints related to the right hip.  Dental prophylaxis reinforced today. ? ?Follow-Up Instructions: No follow-ups on file.  ? ?Orders:  ?Orders Placed This Encounter  ?Procedures  ? XR Pelvis 1-2 Views  ? ?No orders of the defined types were placed in this encounter. ? ? ? ? Procedures: ?No procedures performed ? ? ?Clinical Data: ?No additional findings. ? ? ?Subjective: ?Chief Complaint  ?Patient presents with  ? Right Hip - Follow-up  ?  Right total hip arthroplasty 08/25/2021  ? ? ?HPI ? ?Review of Systems ? ? ?Objective: ?Vital Signs: LMP 11/02/1993  ? ?Physical Exam ? ?Ortho Exam ? ?Specialty Comments:  ?No specialty comments available. ? ?Imaging: ?XR Pelvis 1-2 Views ? ?Result Date: 02/20/2022 ?Stable total hip replacement without complications.  Advanced left hip DJD.  ? ? ?PMFS History: ?Patient Active Problem List  ? Diagnosis Date Noted  ? Clavicle pain 02/13/2022  ? Acute sinusitis 02/13/2022  ? Primary osteoarthritis of left hip 11/21/2021  ? Orthostatic hypotension 09/22/2021  ? Dizziness 09/22/2021  ? Weight loss  09/22/2021  ? Status post total replacement of right hip 08/25/2021  ? Primary osteoarthritis of right hip 06/06/2021  ? Morbid obesity ([facility] 11/14/2020  ? Sleep behavior disorder, 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  ? History of seizure 07/02/2020  ? Obesity with alveolar hypoventilation and body mass index (BMI) of 40 or greater ([facility] 07/02/2020  ? Severe obstructive sleep apnea-hypopnea syndrome 07/02/2020  ? OSA on CPAP 07/02/2020  ? Essential hypertension 05/07/2020  ? Chronic ankle pain 05/07/2020  ? Degenerative joint disease (DJD) of hip 05/07/2020  ? Seizure disorder ([facility] 05/07/2020  ? Hypothyroidism 05/07/2020  ? Mild persistent asthma 05/07/2020  ? ?Past Medical History:  ?Diagnosis Date  ? Arthritis   ? Asthma   ? COVID-19 07/23/2021  ? Depression   ? GERD (gastroesophageal reflux disease)   ? Hypertension   ? Hypothyroid   ? Pre-diabetes   ? Sleep apnea   ? on CPAP  ? Wears hearing aid in right ear   ?  ?Family History  ?Problem Relation Age of Onset  ? Hypertension Father   ? Pancreatic cancer Father   ? Diabetes Maternal Grandmother   ? Colon cancer Neg Hx   ? Stomach cancer Neg Hx   ? Esophageal cancer Neg Hx   ?  ?Past Surgical  History:  ?Procedure Laterality Date  ? ABDOMINAL HYSTERECTOMY    ? ANKLE RECONSTRUCTION    ? CHOLECYSTECTOMY    ? COLONOSCOPY  12/09/2018  ? Dr. Janine Ores. [facility].  3 mm sessile polyp, hepatic flexure. Mild - Moderate diverticulosis, sigmoid colon.  ? CYSTOCELE REPAIR    ? ESOPHAGOGASTRODUODENOSCOPY  12/09/2018  ? Dr. Janine Ores. [facility]. Normal upper GI tract  ? RECTOCELE REPAIR    ? TENDON REPAIR    ? TOTAL ABDOMINAL HYSTERECTOMY    ? TOTAL HIP ARTHROPLASTY Right 08/25/2021  ? Procedure: RIGHT TOTAL HIP ARTHROPLASTY ANTERIOR APPROACH;  Surgeon: Leandrew Koyanagi, MD;  Location: [facility];  Service: Orthopedics;  Laterality: Right;  3-C  ? ?Social History  ? ?Occupational History  ? Not on file  ?Tobacco Use  ? Smoking status: Never  ? Smokeless  tobacco: Never  ?Vaping Use  ? Vaping Use: Never used  ?Substance and Sexual Activity  ? Alcohol use: Yes  ?  Alcohol/week: 1.0 standard drink  ?  Types: 1 Standard drinks or equivalent per week  ?  Comment: Rarely  ? Drug use: Never  ? Sexual activity: Yes  ?  Partners: Male  ? ? ? ? ? ? ?

## 2022-02-22 ENCOUNTER — Ambulatory Visit (INDEPENDENT_AMBULATORY_CARE_PROVIDER_SITE_OTHER): Payer: Managed Care, Other (non HMO)

## 2022-02-22 DIAGNOSIS — M954 Acquired deformity of chest and rib: Secondary | ICD-10-CM | POA: Diagnosis not present

## 2022-02-22 IMAGING — MR MR STERNUM W/O CM
5 series · 16 of 16 positions shown · non-contrast
Comparison: Radiographs [DATE]

CLINICAL DATA: Pain and swelling involving the right
sternoclavicular joint.

EXAM:
MRI OF THE STERNOCLAVICULAR JOINTS WITHOUT CONTRAST
TECHNIQUE: Multiplanar, multiecho pulse sequences of sternoclavicular joints
were obtained without intravenous contrast.

[Series 4: T1 · sagittal · 3.0mm · 0.49mm/px · 2 of 25 slices shown (1 of 2)]
[im 1/25]
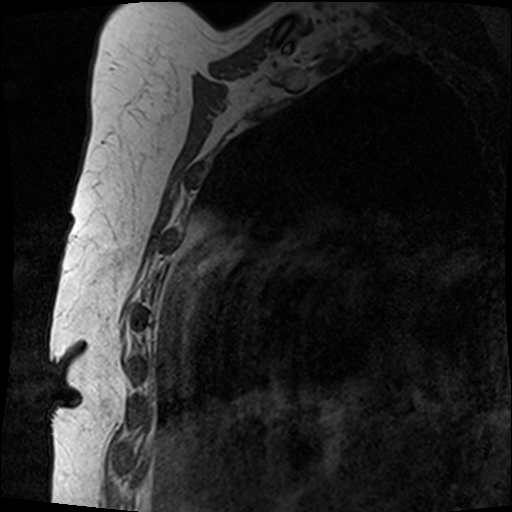
[im 25/25]
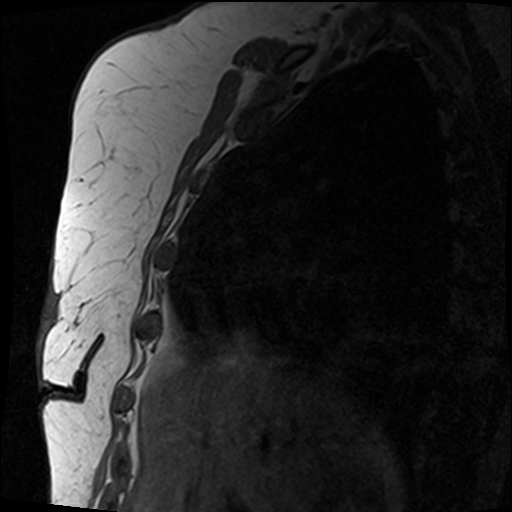

[Series 5: T2 fat-sat · coronal · 3.0mm · 0.98mm/px · 3 of 25 slices shown (1 of 2)]
[im 1/25]
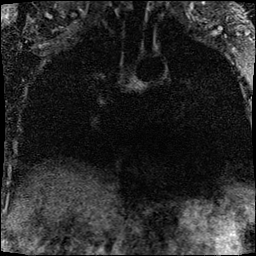
[im 13/25]
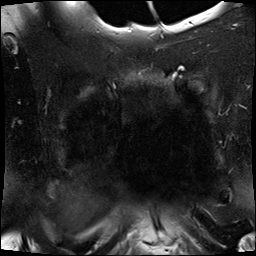
[im 25/25]
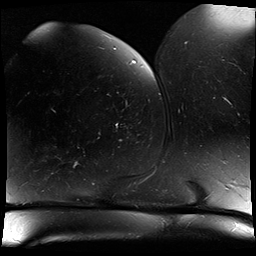

[Series 6: T1 · axial · 4.5mm · 0.27mm/px · z∈[-129,+100]mm · 4 of 42 slices shown (2 of 2)]
[im 1/42]
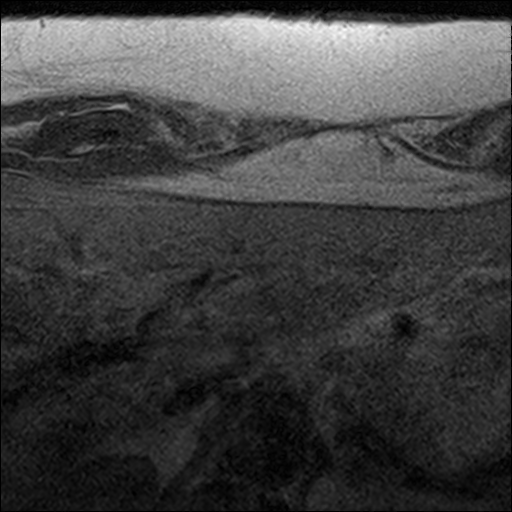
[im 14/42]
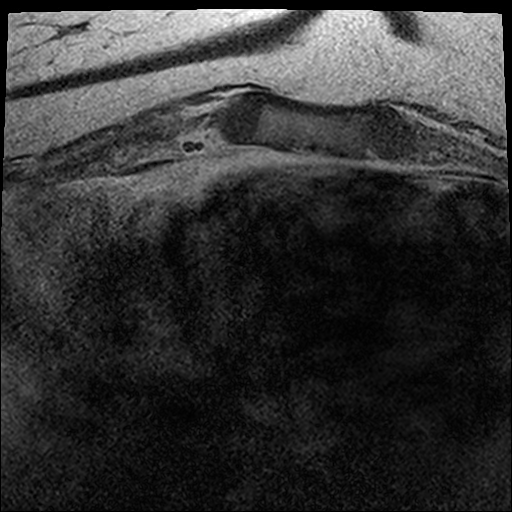
[im 28/42]
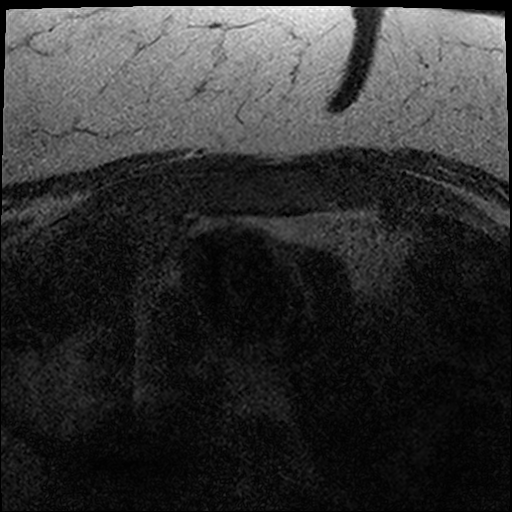
[im 42/42]
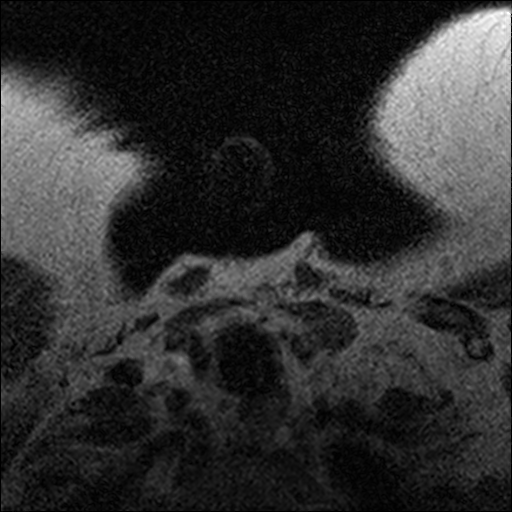

[Series 7: STIR · sagittal · 3.0mm · 0.86mm/px · 3 of 25 slices shown]
[im 1/25]
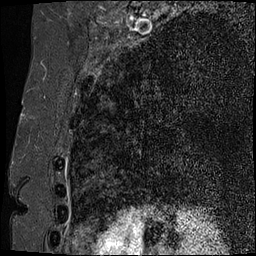
[im 13/25]
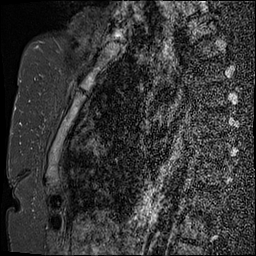
[im 25/25]
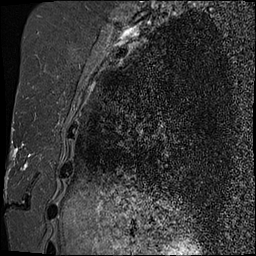

[Series 9: T2 fat-sat · axial · 4.5mm · 0.55mm/px · z∈[-129,+100]mm · 4 of 42 slices shown (2 of 2)]
[im 1/42]
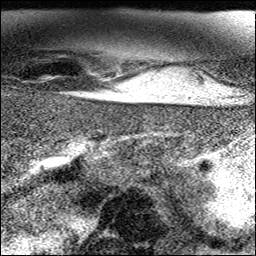
[im 14/42]
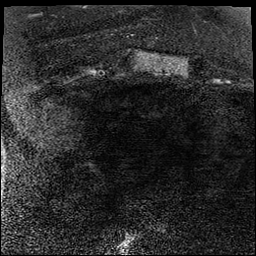
[im 28/42]
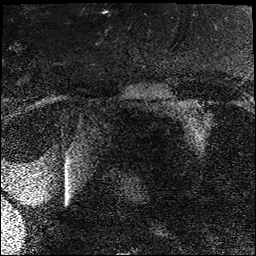
[im 42/42]
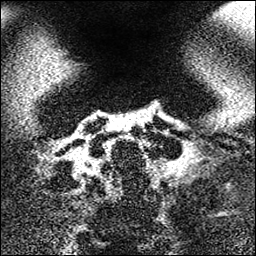

[16 of 16 positions shown; findings below may reference images not displayed]

FINDINGS: Asymmetric right-sided sternoclavicular joint degenerative changes
with joint effusion and mild pannus formation. No destructive bony
changes or obvious erosions. There is associated mild marrow edema
involving the clavicular head. No bone lesion or fracture. No
substernal process. The visualized lungs are grossly clear.
IMPRESSION: 1. Right sternoclavicular joint arthropathy. No definite erosive
changes. No bone lesions or fracture.
2. No other significant findings.

## 2022-02-23 ENCOUNTER — Encounter: Payer: Self-pay | Admitting: Family Medicine

## 2022-03-25 ENCOUNTER — Other Ambulatory Visit: Payer: Self-pay | Admitting: Physician Assistant

## 2022-03-25 NOTE — Pre-Procedure Instructions (Signed)
Surgical Instructions    Your procedure is scheduled on Monday, June 5.  Report to [facility] Main Entrance "A" at 5:30 A.M., then check in with the Admitting office.  Call this number if you have problems the morning of surgery:  [phone]   If you have any questions prior to your surgery date call [phone]: Open Monday-Friday 8am-4pm    Remember:  Do not eat after midnight the night before your surgery  You may drink clear liquids until 4:30AM the morning of your surgery.   Clear liquids allowed are: Water, Non-Citrus Juices (without pulp), Carbonated Beverages, Clear Tea, Black Coffee ONLY (NO MILK, CREAM OR POWDERED CREAMER of any kind), and Gatorade    Take these medicines the morning of surgery with A SIP OF WATER:   DULoxetine (CYMBALTA)  levothyroxine (SYNTHROID) montelukast (SINGULAIR)  omeprazole (PRILOSEC) albuterol (VENTOLIN HFA) 108 (90 Base) MCG/ACT inhaler if needed  Please bring all inhalers with you the day of surgery.   DO NOT TAKE Wegovy on the day of surgery.   Follow your surgeon's instructions on when to stop Aspirin.  If no instructions were given by your surgeon then you will need to call the office to get those instructions.     As of today, STOP taking any Aleve, Naproxen, Ibuprofen, Motrin, Advil, Goody's, BC's, all herbal medications, fish oil, and all vitamins.           Do not wear jewelry or makeup Do not wear lotions, powders, perfumes/colognes, or deodorant. Do not shave 48 hours prior to surgery.  Men may shave face and neck. Do not bring valuables to the hospital. Do not wear nail polish, gel polish, artificial nails, or any other type of covering on natural nails (fingers and toes) If you have artificial nails or gel coating that need to be removed by a nail salon, please have this removed prior to surgery. Artificial nails or gel coating may interfere with anesthesia's ability to adequately monitor your vital signs.  [facility] is  not responsible for any belongings or valuables. .   Do NOT Smoke (Tobacco/Vaping)  24 hours prior to your procedure  If you use a CPAP at night, you may bring your mask for your overnight stay.   Contacts, glasses, hearing aids, dentures or partials may not be worn into surgery, please bring cases for these belongings   For patients admitted to the hospital, discharge time will be determined by your treatment team.   Patients discharged the day of surgery will not be allowed to drive home, and someone needs to stay with them for 24 hours.   SURGICAL WAITING ROOM VISITATION Patients having surgery or a procedure in a hospital may have two support people. Children under the age of 33 must have an adult with them who is not the patient. They may stay in the waiting area during the procedure and may switch out with other visitors. If the patient needs to stay at the hospital during part of their recovery, the visitor guidelines for inpatient rooms apply.  Please refer to the [facility] website for the visitor guidelines for Inpatients (after your surgery is over and you are in a regular room).       Special instructions:    Oral Hygiene is also important to reduce your risk of infection.  Remember - BRUSH YOUR TEETH THE MORNING OF SURGERY WITH YOUR REGULAR TOOTHPASTE   [facility]- Preparing For Surgery  Before surgery, you can play an important role.  Because skin is not sterile, your skin needs to be as free of germs as possible. You can reduce the number of germs on your skin by washing with CHG (chlorahexidine gluconate) Soap before surgery.  CHG is an antiseptic cleaner which kills germs and bonds with the skin to continue killing germs even after washing.     Please do not use if you have an allergy to CHG or antibacterial soaps. If your skin becomes reddened/irritated stop using the CHG.  Do not shave (including legs and underarms) for at least 48 hours prior to first CHG  shower. It is OK to shave your face.  Please follow these instructions carefully.     Shower the NIGHT BEFORE SURGERY and the MORNING OF SURGERY with CHG Soap.   If you chose to wash your hair, wash your hair first as usual with your normal shampoo. After you shampoo, rinse your hair and body thoroughly to remove the shampoo.  Then [employer] and genitals (private parts) with your normal soap and rinse thoroughly to remove soap.  After that Use CHG Soap as you would any other liquid soap. You can apply CHG directly to the skin and wash gently with a scrungie or a clean washcloth.   Apply the CHG Soap to your body ONLY FROM THE NECK DOWN.  Do not use on open wounds or open sores. Avoid contact with your eyes, ears, mouth and genitals (private parts). Wash Face and genitals (private parts)  with your normal soap.   Wash thoroughly, paying special attention to the area where your surgery will be performed.  Thoroughly rinse your body with warm water from the neck down.  DO NOT shower/wash with your normal soap after using and rinsing off the CHG Soap.  Pat yourself dry with a CLEAN TOWEL.  Wear CLEAN PAJAMAS to bed the night before surgery  Place CLEAN SHEETS on your bed the night before your surgery  DO NOT SLEEP WITH PETS.   Day of Surgery:  Take a shower with CHG soap. Wear Clean/Comfortable clothing the morning of surgery Do not apply any deodorants/lotions.   Remember to brush your teeth WITH YOUR REGULAR TOOTHPASTE.    If you received a COVID test during your pre-op visit, it is requested that you wear a mask when out in public, stay away from anyone that may not be feeling well, and notify your surgeon if you develop symptoms. If you have been in contact with anyone that has tested positive in the last 10 days, please notify your surgeon.    Please read over the following fact sheets that you were given.

## 2022-03-26 ENCOUNTER — Encounter (HOSPITAL_COMMUNITY): Payer: Self-pay

## 2022-03-26 ENCOUNTER — Encounter (HOSPITAL_COMMUNITY)
Admission: RE | Admit: 2022-03-26 | Discharge: 2022-03-26 | Disposition: A | Discharge status: Home / Self Care | Payer: Managed Care, Other (non HMO) | Source: Ambulatory Visit | Attending: Orthopaedic Surgery | Admitting: Orthopaedic Surgery

## 2022-03-26 ENCOUNTER — Other Ambulatory Visit: Payer: Self-pay

## 2022-03-26 VITALS — BP 113/78 | HR 85 | Temp 98.0°F | Resp 17 | Ht 66.0 in | Wt 180.1 lb

## 2022-03-26 DIAGNOSIS — Z01812 Encounter for preprocedural laboratory examination: Secondary | ICD-10-CM | POA: Diagnosis present

## 2022-03-26 DIAGNOSIS — Z96642 Presence of left artificial hip joint: Secondary | ICD-10-CM | POA: Insufficient documentation

## 2022-03-26 DIAGNOSIS — Z01818 Encounter for other preprocedural examination: Secondary | ICD-10-CM

## 2022-03-26 LAB — COMPREHENSIVE METABOLIC PANEL
ALT: 18 U/L (ref 0–44)
AST: 17 U/L (ref 15–41)
Albumin: 3.6 g/dL (ref 3.5–5.0)
Alkaline Phosphatase: 75 U/L (ref 38–126)
Anion gap: 5 (ref 5–15)
BUN: 20 mg/dL (ref 6–20)
CO2: 30 mmol/L (ref 22–32)
Calcium: 9.5 mg/dL (ref 8.9–10.3)
Chloride: 106 mmol/L (ref 98–111)
Creatinine, Ser: 0.9 mg/dL (ref 0.44–1.00)
GFR, Estimated: >60 mL/min (ref >60)
Glucose, Bld: 98 mg/dL (ref 70–99)
Potassium: 4.9 mmol/L (ref 3.5–5.1)
Sodium: 141 mmol/L (ref 135–145)
Total Bilirubin: 0.7 mg/dL (ref 0.3–1.2)
Total Protein: 6.5 g/dL (ref 6.5–8.1)

## 2022-03-26 LAB — SURGICAL PCR SCREEN
MRSA, PCR: NEGATIVE
Staphylococcus aureus: POSITIVE — AB

## 2022-03-26 LAB — CBC
HCT: 42.3 % (ref 36.0–46.0)
Hemoglobin: 13.4 g/dL (ref 12.0–15.0)
MCH: 28 pg (ref 26.0–34.0)
MCHC: 31.7 g/dL (ref 30.0–36.0)
MCV: 88.3 fL (ref 80.0–100.0)
Platelets: 277 10*3/uL (ref 150–400)
RBC: 4.79 MIL/uL (ref 3.87–5.11)
RDW: 14 % (ref 11.5–15.5)
WBC: 6.6 10*3/uL (ref 4.0–10.5)
nRBC: 0 % (ref 0.0–0.2)

## 2022-03-26 NOTE — Pre-Procedure Instructions (Signed)
Surgical Instructions    Your procedure is scheduled on Monday, June 5.  Report to [facility] Main Entrance "A" at 5:30 A.M., then check in with the Admitting office.  Call this number if you have problems the morning of surgery:  [phone]   If you have any questions prior to your surgery date call [phone]: Open Monday-Friday 8am-4pm    Remember:  Do not eat after midnight the night before your surgery  You may drink clear liquids until 4:30AM the morning of your surgery.   Clear liquids allowed are: Water, Non-Citrus Juices (without pulp), Carbonated Beverages, Clear Tea, Black Coffee ONLY (NO MILK, CREAM OR POWDERED CREAMER of any kind), and Gatorade  Patient Instructions  The night before surgery:  No food after midnight. ONLY clear liquids after midnight  The day of surgery (if you do NOT have diabetes):  Drink ONE (1) Pre-Surgery Clear Ensure by 04:30 AM the morning of surgery. Drink in one sitting. Do not sip.  This drink was given to you during your hospital  pre-op appointment visit.  Nothing else to drink after completing the  Pre-Surgery Clear Ensure.          If you have questions, please contact your surgeon's office.      Take these medicines the morning of surgery with A SIP OF WATER:   DULoxetine (CYMBALTA)  levothyroxine (SYNTHROID) montelukast (SINGULAIR)  omeprazole (PRILOSEC) albuterol (VENTOLIN HFA) 108 (90 Base) MCG/ACT inhaler if needed  Please bring all inhalers with you the day of surgery.   DO NOT TAKE Wegovy on the day of surgery.   Follow your surgeon's instructions on when to stop Aspirin.  If no instructions were given by your surgeon then you will need to call the office to get those instructions.     As of today, STOP taking any Aleve, Naproxen, Ibuprofen, Motrin, Advil, Goody's, BC's, all herbal medications, fish oil, and all vitamins.           Do not wear jewelry or makeup Do not wear lotions, powders, perfumes/colognes,  or deodorant. Do not shave 48 hours prior to surgery.  Men may shave face and neck. Do not bring valuables to the hospital. Do not wear nail polish, gel polish, artificial nails, or any other type of covering on natural nails (fingers and toes) If you have artificial nails or gel coating that need to be removed by a nail salon, please have this removed prior to surgery. Artificial nails or gel coating may interfere with anesthesia's ability to adequately monitor your vital signs.  [facility] is not responsible for any belongings or valuables. .   Do NOT Smoke (Tobacco/Vaping)  24 hours prior to your procedure  If you use a CPAP at night, you may bring your mask for your overnight stay.   Contacts, glasses, hearing aids, dentures or partials may not be worn into surgery, please bring cases for these belongings   For patients admitted to the hospital, discharge time will be determined by your treatment team.   Patients discharged the day of surgery will not be allowed to drive home, and someone needs to stay with them for 24 hours.   SURGICAL WAITING ROOM VISITATION Patients having surgery or a procedure in a hospital may have two support people. Children under the age of 12 must have an adult with them who is not the patient. They may stay in the waiting area during the procedure and may switch out with other visitors. If the  patient needs to stay at the hospital during part of their recovery, the visitor guidelines for inpatient rooms apply.  Please refer to the [facility] website for the visitor guidelines for Inpatients (after your surgery is over and you are in a regular room).       Special instructions:    Oral Hygiene is also important to reduce your risk of infection.  Remember - BRUSH YOUR TEETH THE MORNING OF SURGERY WITH YOUR REGULAR TOOTHPASTE   [facility]- Preparing For Surgery  Before surgery, you can play an important role. Because skin is not sterile, your skin  needs to be as free of germs as possible. You can reduce the number of germs on your skin by washing with CHG (chlorahexidine gluconate) Soap before surgery.  CHG is an antiseptic cleaner which kills germs and bonds with the skin to continue killing germs even after washing.     Please do not use if you have an allergy to CHG or antibacterial soaps. If your skin becomes reddened/irritated stop using the CHG.  Do not shave (including legs and underarms) for at least 48 hours prior to first CHG shower. It is OK to shave your face.  Please follow these instructions carefully.     Shower the NIGHT BEFORE SURGERY and the MORNING OF SURGERY with CHG Soap.   If you chose to wash your hair, wash your hair first as usual with your normal shampoo. After you shampoo, rinse your hair and body thoroughly to remove the shampoo.  Then [employer] and genitals (private parts) with your normal soap and rinse thoroughly to remove soap.  After that Use CHG Soap as you would any other liquid soap. You can apply CHG directly to the skin and wash gently with a scrungie or a clean washcloth.   Apply the CHG Soap to your body ONLY FROM THE NECK DOWN.  Do not use on open wounds or open sores. Avoid contact with your eyes, ears, mouth and genitals (private parts). Wash Face and genitals (private parts)  with your normal soap.   Wash thoroughly, paying special attention to the area where your surgery will be performed.  Thoroughly rinse your body with warm water from the neck down.  DO NOT shower/wash with your normal soap after using and rinsing off the CHG Soap.  Pat yourself dry with a CLEAN TOWEL.  Wear CLEAN PAJAMAS to bed the night before surgery  Place CLEAN SHEETS on your bed the night before your surgery  DO NOT SLEEP WITH PETS.   Day of Surgery:  Take a shower with CHG soap. Wear Clean/Comfortable clothing the morning of surgery Do not apply any deodorants/lotions.   Remember to brush your teeth  WITH YOUR REGULAR TOOTHPASTE.    If you received a COVID test during your pre-op visit, it is requested that you wear a mask when out in public, stay away from anyone that may not be feeling well, and notify your surgeon if you develop symptoms. If you have been in contact with anyone that has tested positive in the last 10 days, please notify your surgeon.    Please read over the following fact sheets that you were given.

## 2022-03-26 NOTE — Progress Notes (Signed)
PCP - Dr. Luetta Nutting Cardiologist - denies  PPM/ICD - denies   Chest x-ray - 5 years per pt, no abnormalities EKG - 08/22/21 Stress Test - denies ECHO - denies Cardiac Cath - denies  Sleep Study - 07/29/20 OSA+ CPAP - nightly    DM- denies  ASA/Blood Thinner Instructions: n/a   ERAS Protcol - yes PRE-SURGERY Ensure given at PAT   COVID TEST- n/a   Anesthesia review: no  Patient denies shortness of breath, fever, cough and chest pain at PAT appointment   All instructions explained to the patient, with a verbal understanding of the material. Patient agrees to go over the instructions while at home for a better understanding. Patient also instructed to notify surgeon of any contact with COVID+ person or if she develops any symptoms. The opportunity to ask questions was provided.

## 2022-03-27 ENCOUNTER — Encounter: Payer: Self-pay | Admitting: Orthopaedic Surgery

## 2022-03-27 NOTE — Telephone Encounter (Signed)
No need for pre-op abx.  Sinus infection and runny nose unlikely related

## 2022-03-28 ENCOUNTER — Other Ambulatory Visit: Payer: Self-pay | Admitting: Family Medicine

## 2022-03-29 ENCOUNTER — Other Ambulatory Visit: Payer: Self-pay | Admitting: Family Medicine

## 2022-03-29 DIAGNOSIS — I10 Essential (primary) hypertension: Secondary | ICD-10-CM

## 2022-03-31 ENCOUNTER — Other Ambulatory Visit: Payer: Self-pay | Admitting: Physician Assistant

## 2022-03-31 MED ORDER — OXYCODONE-ACETAMINOPHEN 5-325 MG PO TABS: 1.0000 | ORAL_TABLET | Freq: Four times a day (QID) | ORAL | 0 refills | Status: DC | PRN

## 2022-03-31 MED ORDER — METHOCARBAMOL 750 MG PO TABS: 750.0000 mg | ORAL_TABLET | Freq: Two times a day (BID) | ORAL | 2 refills | Status: DC | PRN

## 2022-03-31 MED ORDER — ONDANSETRON HCL 4 MG PO TABS: 4.0000 mg | ORAL_TABLET | Freq: Three times a day (TID) | ORAL | 0 refills | Status: DC | PRN

## 2022-03-31 MED ORDER — DOCUSATE SODIUM 100 MG PO CAPS: 100.0000 mg | ORAL_CAPSULE | Freq: Every day | ORAL | 2 refills | Status: DC | PRN

## 2022-03-31 MED ORDER — ASPIRIN 81 MG PO TBEC: 81.0000 mg | DELAYED_RELEASE_TABLET | Freq: Two times a day (BID) | ORAL | 0 refills | Status: AC

## 2022-04-01 ENCOUNTER — Other Ambulatory Visit: Payer: Self-pay | Admitting: Physician Assistant

## 2022-04-03 ENCOUNTER — Telehealth: Payer: Self-pay | Admitting: Orthopaedic Surgery

## 2022-04-03 MED ORDER — TRANEXAMIC ACID 1000 MG/10ML IV SOLN
2000.0000 mg | INTRAVENOUS | Status: DC
  Filled 2022-04-03: qty 20

## 2022-04-03 NOTE — Telephone Encounter (Signed)
Patient called to say the Rx for percocet 5-325 was not filled because it is on back order at [facility].  Patient could not find it elsewhere within driving distance. Her left total hip is scheduled for this Monday June 5th.  Please advise patient if she can find elsewhere or if she can have filled at the hospital pharmacy upon discharge.  [phone]

## 2022-04-05 ENCOUNTER — Other Ambulatory Visit: Payer: Self-pay | Admitting: Physician Assistant

## 2022-04-05 MED ORDER — OXYCODONE-ACETAMINOPHEN 7.5-325 MG PO TABS: 1.0000 | ORAL_TABLET | Freq: Four times a day (QID) | ORAL | 0 refills | Status: DC | PRN

## 2022-04-05 NOTE — Telephone Encounter (Signed)
My guess is this is a [facility] problem.  I have sent in percocet 7.5-325.  If they do not have that, please have her pick a pharmacy besides [facility].  I am not sure how the hospital pharmacy works

## 2022-04-06 ENCOUNTER — Ambulatory Visit (HOSPITAL_COMMUNITY): Payer: Managed Care, Other (non HMO) | Admitting: Certified Registered Nurse Anesthetist

## 2022-04-06 ENCOUNTER — Observation Stay (HOSPITAL_COMMUNITY)
Admission: RE | Admit: 2022-04-06 | Discharge: 2022-04-07 | Disposition: A | Discharge status: Home / Self Care | Payer: Managed Care, Other (non HMO) | Source: Ambulatory Visit | Attending: Orthopaedic Surgery | Admitting: Orthopaedic Surgery

## 2022-04-06 ENCOUNTER — Other Ambulatory Visit: Payer: Self-pay

## 2022-04-06 ENCOUNTER — Ambulatory Visit (HOSPITAL_COMMUNITY): Payer: Managed Care, Other (non HMO)

## 2022-04-06 ENCOUNTER — Encounter (HOSPITAL_COMMUNITY): Payer: Self-pay | Admitting: Orthopaedic Surgery

## 2022-04-06 ENCOUNTER — Encounter (HOSPITAL_COMMUNITY)
Admission: RE | Disposition: A | Discharge status: Home / Self Care | Payer: Self-pay | Source: Ambulatory Visit | Attending: Orthopaedic Surgery

## 2022-04-06 ENCOUNTER — Ambulatory Visit (HOSPITAL_BASED_OUTPATIENT_CLINIC_OR_DEPARTMENT_OTHER): Payer: Managed Care, Other (non HMO) | Admitting: Certified Registered Nurse Anesthetist

## 2022-04-06 DIAGNOSIS — J45909 Unspecified asthma, uncomplicated: Secondary | ICD-10-CM | POA: Insufficient documentation

## 2022-04-06 DIAGNOSIS — Z8616 Personal history of COVID-19: Secondary | ICD-10-CM | POA: Insufficient documentation

## 2022-04-06 DIAGNOSIS — I10 Essential (primary) hypertension: Secondary | ICD-10-CM | POA: Insufficient documentation

## 2022-04-06 DIAGNOSIS — Z96642 Presence of left artificial hip joint: Secondary | ICD-10-CM

## 2022-04-06 DIAGNOSIS — Z79899 Other long term (current) drug therapy: Secondary | ICD-10-CM | POA: Diagnosis not present

## 2022-04-06 DIAGNOSIS — M1612 Unilateral primary osteoarthritis, left hip: Principal | ICD-10-CM | POA: Diagnosis present

## 2022-04-06 DIAGNOSIS — E039 Hypothyroidism, unspecified: Secondary | ICD-10-CM | POA: Insufficient documentation

## 2022-04-06 DIAGNOSIS — Z7982 Long term (current) use of aspirin: Secondary | ICD-10-CM | POA: Diagnosis not present

## 2022-04-06 DIAGNOSIS — Z96641 Presence of right artificial hip joint: Secondary | ICD-10-CM | POA: Diagnosis not present

## 2022-04-06 HISTORY — PX: TOTAL HIP ARTHROPLASTY: SHX124

## 2022-04-06 IMAGING — DX DG PORTABLE PELVIS
1 series · 1 of 1 positions shown · non-contrast
Comparison: [DATE]

CLINICAL DATA: Postop left hip arthroplasty

EXAM:
PORTABLE PELVIS 1-2 VIEWS

[pelvis]
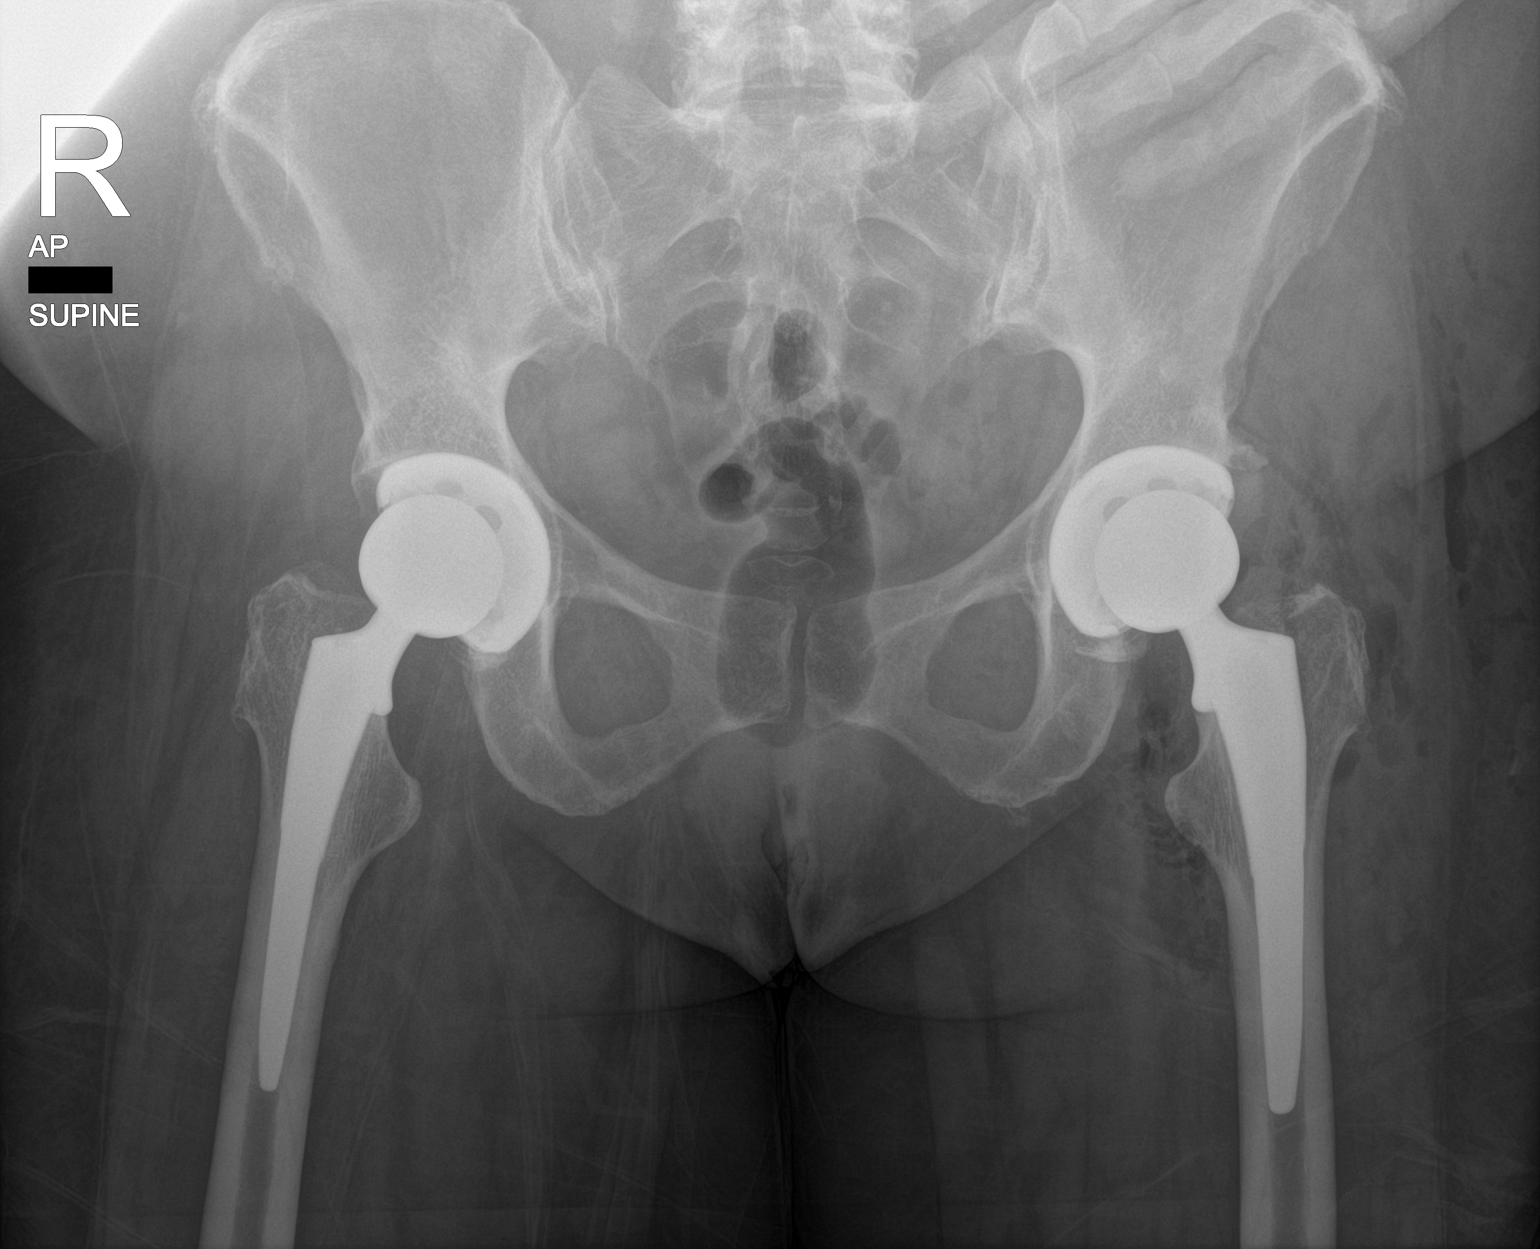

[1 of 1 positions shown; findings below may reference images not displayed]

FINDINGS: Left total hip arthroplasty changes noted. Components appear aligned
in the frontal plane. Remote right hip arthroplasty. Bony pelvis
intact. No acute osseous finding or hardware abnormality. Expected
postoperative changes of the left hip soft tissues with scattered
soft tissue air noted.
IMPRESSION: Expected appearance status post recent left total hip arthroplasty.
No complicating feature by plain radiography.

## 2022-04-06 SURGERY — ARTHROPLASTY, HIP, TOTAL, ANTERIOR APPROACH
Anesthesia: Monitor Anesthesia Care | Site: Hip | Laterality: Left

## 2022-04-06 MED ORDER — ACETAMINOPHEN 325 MG PO TABS: 325.0000 mg | ORAL_TABLET | Freq: Four times a day (QID) | ORAL | Status: DC | PRN

## 2022-04-06 MED ORDER — METHOCARBAMOL 1000 MG/10ML IJ SOLN: 500.0000 mg | Freq: Four times a day (QID) | INTRAVENOUS | Status: DC | PRN

## 2022-04-06 MED ORDER — BUPIVACAINE IN DEXTROSE 0.75-8.25 % IT SOLN
INTRATHECAL | Status: DC | PRN
  Administered 2022-04-06: 2 mL via INTRATHECAL

## 2022-04-06 MED ORDER — PROPOFOL 10 MG/ML IV BOLUS
INTRAVENOUS | Status: AC
  Filled 2022-04-06: qty 20

## 2022-04-06 MED ORDER — MIDAZOLAM HCL 2 MG/2ML IJ SOLN
INTRAMUSCULAR | Status: AC
  Filled 2022-04-06: qty 2

## 2022-04-06 MED ORDER — PHENYLEPHRINE 80 MCG/ML (10ML) SYRINGE FOR IV PUSH (FOR BLOOD PRESSURE SUPPORT)
PREFILLED_SYRINGE | INTRAVENOUS | Status: DC | PRN
  Administered 2022-04-06: 80 ug via INTRAVENOUS
  Administered 2022-04-06: 160 ug via INTRAVENOUS
  Administered 2022-04-06 (×4): 80 ug via INTRAVENOUS

## 2022-04-06 MED ORDER — HYDROMORPHONE HCL 1 MG/ML IJ SOLN: 0.2500 mg | INTRAMUSCULAR | Status: DC | PRN

## 2022-04-06 MED ORDER — LIDOCAINE 2% (20 MG/ML) 5 ML SYRINGE
INTRAMUSCULAR | Status: DC | PRN
  Administered 2022-04-06: 20 mg via INTRAVENOUS

## 2022-04-06 MED ORDER — TRANEXAMIC ACID 1000 MG/10ML IV SOLN
INTRAVENOUS | Status: DC | PRN
  Administered 2022-04-06: 2000 mg via TOPICAL

## 2022-04-06 MED ORDER — VANCOMYCIN HCL 1000 MG IV SOLR
INTRAVENOUS | Status: AC
  Filled 2022-04-06: qty 20

## 2022-04-06 MED ORDER — OXYCODONE HCL 5 MG PO TABS
10.0000 mg | ORAL_TABLET | ORAL | Status: DC | PRN
  Administered 2022-04-07: 10 mg via ORAL

## 2022-04-06 MED ORDER — BUPIVACAINE-MELOXICAM ER 400-12 MG/14ML IJ SOLN
INTRAMUSCULAR | Status: AC
  Filled 2022-04-06: qty 1

## 2022-04-06 MED ORDER — METOCLOPRAMIDE HCL 5 MG/ML IJ SOLN: 5.0000 mg | Freq: Three times a day (TID) | INTRAMUSCULAR | Status: DC | PRN

## 2022-04-06 MED ORDER — OXYCODONE HCL 5 MG PO TABS
5.0000 mg | ORAL_TABLET | ORAL | Status: DC | PRN
  Administered 2022-04-06 – 2022-04-07 (×2): 10 mg via ORAL
  Filled 2022-04-06 (×3): qty 2

## 2022-04-06 MED ORDER — ALUM & MAG HYDROXIDE-SIMETH 200-200-20 MG/5ML PO SUSP: 30.0000 mL | ORAL | Status: DC | PRN

## 2022-04-06 MED ORDER — LACTATED RINGERS IV SOLN: INTRAVENOUS | Status: DC

## 2022-04-06 MED ORDER — OXYCODONE HCL 5 MG PO TABS: 5.0000 mg | ORAL_TABLET | Freq: Once | ORAL | Status: DC | PRN

## 2022-04-06 MED ORDER — SPIRONOLACTONE 25 MG PO TABS
50.0000 mg | ORAL_TABLET | Freq: Every day | ORAL | Status: DC
  Administered 2022-04-07: 50 mg via ORAL
  Filled 2022-04-06: qty 2

## 2022-04-06 MED ORDER — LEVOTHYROXINE SODIUM 25 MCG PO TABS
125.0000 ug | ORAL_TABLET | Freq: Every day | ORAL | Status: DC
  Administered 2022-04-07: 125 ug via ORAL
  Filled 2022-04-06: qty 1

## 2022-04-06 MED ORDER — ONDANSETRON HCL 4 MG/2ML IJ SOLN
INTRAMUSCULAR | Status: DC | PRN
  Administered 2022-04-06: 4 mg via INTRAVENOUS

## 2022-04-06 MED ORDER — PROMETHAZINE HCL 25 MG/ML IJ SOLN
INTRAMUSCULAR | Status: AC
  Filled 2022-04-06: qty 1

## 2022-04-06 MED ORDER — PANTOPRAZOLE SODIUM 40 MG PO TBEC
40.0000 mg | DELAYED_RELEASE_TABLET | Freq: Every day | ORAL | Status: DC
  Administered 2022-04-07: 40 mg via ORAL
  Filled 2022-04-06: qty 1

## 2022-04-06 MED ORDER — ORAL CARE MOUTH RINSE: 15.0000 mL | Freq: Once | OROMUCOSAL | Status: AC

## 2022-04-06 MED ORDER — PROPOFOL 500 MG/50ML IV EMUL
INTRAVENOUS | Status: DC | PRN
  Administered 2022-04-06: 75 ug/kg/min via INTRAVENOUS

## 2022-04-06 MED ORDER — DOCUSATE SODIUM 100 MG PO CAPS
100.0000 mg | ORAL_CAPSULE | Freq: Two times a day (BID) | ORAL | Status: DC
  Administered 2022-04-06 – 2022-04-07 (×3): 100 mg via ORAL
  Filled 2022-04-06 (×3): qty 1

## 2022-04-06 MED ORDER — DULOXETINE HCL 30 MG PO CPEP
30.0000 mg | ORAL_CAPSULE | Freq: Every day | ORAL | Status: DC
  Administered 2022-04-06: 30 mg via ORAL
  Filled 2022-04-06: qty 1

## 2022-04-06 MED ORDER — CEFAZOLIN SODIUM-DEXTROSE 2-4 GM/100ML-% IV SOLN
2.0000 g | Freq: Four times a day (QID) | INTRAVENOUS | Status: AC
  Administered 2022-04-06 (×2): 2 g via INTRAVENOUS
  Filled 2022-04-06 (×2): qty 100

## 2022-04-06 MED ORDER — ONDANSETRON HCL 4 MG/2ML IJ SOLN
INTRAMUSCULAR | Status: AC
Start: 2022-04-06 — End: ?
  Filled 2022-04-06: qty 2

## 2022-04-06 MED ORDER — ONDANSETRON HCL 4 MG PO TABS
4.0000 mg | ORAL_TABLET | Freq: Four times a day (QID) | ORAL | Status: DC | PRN
  Administered 2022-04-07: 4 mg via ORAL
  Filled 2022-04-06: qty 1

## 2022-04-06 MED ORDER — SODIUM CHLORIDE 0.9 % IV SOLN: INTRAVENOUS | Status: DC

## 2022-04-06 MED ORDER — DULOXETINE HCL 30 MG PO CPEP: 30.0000 mg | ORAL_CAPSULE | Freq: Every day | ORAL | Status: DC

## 2022-04-06 MED ORDER — POLYETHYLENE GLYCOL 3350 17 G PO PACK
17.0000 g | PACK | Freq: Every day | ORAL | Status: DC
  Administered 2022-04-06 – 2022-04-07 (×2): 17 g via ORAL
  Filled 2022-04-06 (×2): qty 1

## 2022-04-06 MED ORDER — BUPIVACAINE-MELOXICAM ER 400-12 MG/14ML IJ SOLN
INTRAMUSCULAR | Status: DC | PRN
  Administered 2022-04-06: 400 mg

## 2022-04-06 MED ORDER — SORBITOL 70 % SOLN: 30.0000 mL | Freq: Every day | Status: DC | PRN

## 2022-04-06 MED ORDER — METHOCARBAMOL 500 MG PO TABS
500.0000 mg | ORAL_TABLET | Freq: Four times a day (QID) | ORAL | Status: DC | PRN
  Administered 2022-04-06 – 2022-04-07 (×4): 500 mg via ORAL
  Filled 2022-04-06 (×3): qty 1

## 2022-04-06 MED ORDER — OXYCODONE HCL ER 10 MG PO T12A
10.0000 mg | EXTENDED_RELEASE_TABLET | Freq: Two times a day (BID) | ORAL | Status: DC
  Administered 2022-04-06 – 2022-04-07 (×3): 10 mg via ORAL
  Filled 2022-04-06 (×3): qty 1

## 2022-04-06 MED ORDER — FENTANYL CITRATE (PF) 250 MCG/5ML IJ SOLN
INTRAMUSCULAR | Status: AC
  Filled 2022-04-06: qty 5

## 2022-04-06 MED ORDER — PROPOFOL 10 MG/ML IV BOLUS
INTRAVENOUS | Status: DC | PRN
  Administered 2022-04-06 (×2): 20 mg via INTRAVENOUS
  Administered 2022-04-06: 10 mg via INTRAVENOUS

## 2022-04-06 MED ORDER — CEFAZOLIN SODIUM-DEXTROSE 2-4 GM/100ML-% IV SOLN
2.0000 g | INTRAVENOUS | Status: AC
  Administered 2022-04-06: 2 g via INTRAVENOUS
  Filled 2022-04-06: qty 100

## 2022-04-06 MED ORDER — LISINOPRIL 20 MG PO TABS
20.0000 mg | ORAL_TABLET | Freq: Every day | ORAL | Status: DC
  Administered 2022-04-07: 20 mg via ORAL
  Filled 2022-04-06: qty 1

## 2022-04-06 MED ORDER — DULOXETINE HCL 30 MG PO CPEP
60.0000 mg | ORAL_CAPSULE | Freq: Every day | ORAL | Status: DC
  Administered 2022-04-07: 60 mg via ORAL
  Filled 2022-04-06: qty 2

## 2022-04-06 MED ORDER — METOCLOPRAMIDE HCL 5 MG PO TABS: 5.0000 mg | ORAL_TABLET | Freq: Three times a day (TID) | ORAL | Status: DC | PRN

## 2022-04-06 MED ORDER — ONDANSETRON HCL 4 MG/2ML IJ SOLN: 4.0000 mg | Freq: Four times a day (QID) | INTRAMUSCULAR | Status: DC | PRN

## 2022-04-06 MED ORDER — PHENYLEPHRINE HCL-NACL 20-0.9 MG/250ML-% IV SOLN: INTRAVENOUS | Status: DC | PRN

## 2022-04-06 MED ORDER — POVIDONE-IODINE 10 % EX SWAB
2.0000 "application " | Freq: Once | CUTANEOUS | Status: AC
  Administered 2022-04-06: 2 via TOPICAL

## 2022-04-06 MED ORDER — DIPHENHYDRAMINE HCL 12.5 MG/5ML PO ELIX: 25.0000 mg | ORAL_SOLUTION | ORAL | Status: DC | PRN

## 2022-04-06 MED ORDER — LISINOPRIL 2.5 MG PO TABS: 2.5000 mg | ORAL_TABLET | Freq: Every day | ORAL | Status: DC | PRN

## 2022-04-06 MED ORDER — 0.9 % SODIUM CHLORIDE (POUR BTL) OPTIME
TOPICAL | Status: DC | PRN
  Administered 2022-04-06: 1000 mL

## 2022-04-06 MED ORDER — PROMETHAZINE HCL 25 MG/ML IJ SOLN
6.2500 mg | INTRAMUSCULAR | Status: DC | PRN
  Administered 2022-04-06: 6.25 mg via INTRAVENOUS

## 2022-04-06 MED ORDER — TRANEXAMIC ACID-NACL 1000-0.7 MG/100ML-% IV SOLN
1000.0000 mg | Freq: Once | INTRAVENOUS | Status: AC
  Administered 2022-04-06: 1000 mg via INTRAVENOUS

## 2022-04-06 MED ORDER — TRANEXAMIC ACID-NACL 1000-0.7 MG/100ML-% IV SOLN
1000.0000 mg | INTRAVENOUS | Status: AC
  Administered 2022-04-06: 1000 mg via INTRAVENOUS
  Filled 2022-04-06: qty 100

## 2022-04-06 MED ORDER — CHLORHEXIDINE GLUCONATE 0.12 % MT SOLN
15.0000 mL | Freq: Once | OROMUCOSAL | Status: AC
  Administered 2022-04-06: 15 mL via OROMUCOSAL
  Filled 2022-04-06: qty 15

## 2022-04-06 MED ORDER — PRONTOSAN WOUND IRRIGATION OPTIME
TOPICAL | Status: DC | PRN
  Administered 2022-04-06: 1 via TOPICAL

## 2022-04-06 MED ORDER — VANCOMYCIN HCL 1 G IV SOLR
INTRAVENOUS | Status: DC | PRN
  Administered 2022-04-06: 1000 mg

## 2022-04-06 MED ORDER — ACETAMINOPHEN 500 MG PO TABS
1000.0000 mg | ORAL_TABLET | Freq: Four times a day (QID) | ORAL | Status: AC
  Administered 2022-04-06 (×3): 1000 mg via ORAL
  Filled 2022-04-06 (×3): qty 2

## 2022-04-06 MED ORDER — DEXAMETHASONE SODIUM PHOSPHATE 10 MG/ML IJ SOLN: 10.0000 mg | Freq: Once | INTRAMUSCULAR | Status: DC

## 2022-04-06 MED ORDER — PHENOL 1.4 % MT LIQD
1.0000 | OROMUCOSAL | Status: DC | PRN
Start: 2022-04-06 — End: 2022-04-07

## 2022-04-06 MED ORDER — HYDROMORPHONE HCL 1 MG/ML IJ SOLN
0.5000 mg | INTRAMUSCULAR | Status: DC | PRN
  Administered 2022-04-06: 1 mg via INTRAVENOUS

## 2022-04-06 MED ORDER — FERROUS SULFATE 325 (65 FE) MG PO TABS
325.0000 mg | ORAL_TABLET | Freq: Three times a day (TID) | ORAL | Status: DC
  Administered 2022-04-06 – 2022-04-07 (×3): 325 mg via ORAL
  Filled 2022-04-06 (×3): qty 1

## 2022-04-06 MED ORDER — MAGNESIUM OXIDE -MG SUPPLEMENT 400 (240 MG) MG PO TABS
200.0000 mg | ORAL_TABLET | Freq: Every day | ORAL | Status: DC
  Administered 2022-04-07: 200 mg via ORAL
  Filled 2022-04-06: qty 1

## 2022-04-06 MED ORDER — LIDOCAINE 2% (20 MG/ML) 5 ML SYRINGE
INTRAMUSCULAR | Status: AC
  Filled 2022-04-06: qty 5

## 2022-04-06 MED ORDER — MIDAZOLAM HCL 2 MG/2ML IJ SOLN
INTRAMUSCULAR | Status: DC | PRN
  Administered 2022-04-06: 2 mg via INTRAVENOUS

## 2022-04-06 MED ORDER — PROPOFOL 10 MG/ML IV BOLUS
INTRAVENOUS | Status: AC
Start: 2022-04-06 — End: ?
  Filled 2022-04-06: qty 20

## 2022-04-06 MED ORDER — ASPIRIN 81 MG PO CHEW
81.0000 mg | CHEWABLE_TABLET | Freq: Two times a day (BID) | ORAL | Status: DC
Start: 2022-04-06 — End: 2022-04-07
  Administered 2022-04-06 – 2022-04-07 (×2): 81 mg via ORAL
  Filled 2022-04-06 (×2): qty 1

## 2022-04-06 MED ORDER — OXYCODONE HCL 5 MG/5ML PO SOLN: 5.0000 mg | Freq: Once | ORAL | Status: DC | PRN

## 2022-04-06 MED ORDER — MENTHOL 3 MG MT LOZG: 1.0000 | LOZENGE | OROMUCOSAL | Status: DC | PRN

## 2022-04-06 MED ORDER — SODIUM CHLORIDE 0.9 % IR SOLN
Status: DC | PRN
  Administered 2022-04-06: 1000 mL

## 2022-04-06 SURGICAL SUPPLY — 65 items
ACETAB CUP W/GRIPTION 54 (Plate) ×2 IMPLANT
BAG COUNTER SPONGE SURGICOUNT (BAG) ×2 IMPLANT
BAG DECANTER FOR FLEXI CONT (MISCELLANEOUS) ×2 IMPLANT
CELLS DAT CNTRL 66122 CELL SVR (MISCELLANEOUS) IMPLANT
COVER PERINEAL POST (MISCELLANEOUS) ×2 IMPLANT
COVER SURGICAL LIGHT HANDLE (MISCELLANEOUS) ×2 IMPLANT
CUP ACETAB W/GRIPTION 54 (Plate) IMPLANT
DERMABOND ADVANCED (GAUZE/BANDAGES/DRESSINGS) ×1
DERMABOND ADVANCED .7 DNX12 (GAUZE/BANDAGES/DRESSINGS) IMPLANT
DRAPE C-ARM 42X72 X-RAY (DRAPES) ×2 IMPLANT
DRAPE POUCH INSTRU U-SHP 10X18 (DRAPES) ×2 IMPLANT
DRAPE STERI IOBAN 125X83 (DRAPES) ×2 IMPLANT
DRAPE U-SHAPE 47X51 STRL (DRAPES) ×4 IMPLANT
DRSG AQUACEL AG ADV 3.5X10 (GAUZE/BANDAGES/DRESSINGS) ×2 IMPLANT
DURAPREP 26ML APPLICATOR (WOUND CARE) ×4 IMPLANT
ELECT BLADE 4.0 EZ CLEAN MEGAD (MISCELLANEOUS) ×2
ELECT REM PT RETURN 9FT ADLT (ELECTROSURGICAL) ×2
ELECTRODE BLDE 4.0 EZ CLN MEGD (MISCELLANEOUS) ×1 IMPLANT
ELECTRODE REM PT RTRN 9FT ADLT (ELECTROSURGICAL) ×1 IMPLANT
FEM STEM 12/14 TAPER SZ 4 HIP (Orthopedic Implant) ×2 IMPLANT
FEMORAL STEM 12/14 TPR SZ4 HIP (Orthopedic Implant) IMPLANT
GLOVE BIOGEL PI IND STRL 7.0 (GLOVE) ×1 IMPLANT
GLOVE BIOGEL PI IND STRL 7.5 (GLOVE) ×4 IMPLANT
GLOVE BIOGEL PI INDICATOR 7.0 (GLOVE) ×1
GLOVE BIOGEL PI INDICATOR 7.5 (GLOVE) ×4
GLOVE ECLIPSE 7.0 STRL STRAW (GLOVE) ×4 IMPLANT
GLOVE SKINSENSE NS SZ7.5 (GLOVE) ×1
GLOVE SKINSENSE STRL SZ7.5 (GLOVE) ×1 IMPLANT
GLOVE SURG UNDER POLY LF SZ7 (GLOVE) ×2 IMPLANT
GLOVE SURG UNDER POLY LF SZ7.5 (GLOVE) ×4 IMPLANT
GOWN STRL REIN XL XLG (GOWN DISPOSABLE) ×2 IMPLANT
GOWN STRL REUS W/ TWL LRG LVL3 (GOWN DISPOSABLE) IMPLANT
GOWN STRL REUS W/ TWL XL LVL3 (GOWN DISPOSABLE) ×1 IMPLANT
GOWN STRL REUS W/TWL LRG LVL3 (GOWN DISPOSABLE)
GOWN STRL REUS W/TWL XL LVL3 (GOWN DISPOSABLE) ×1
HANDPIECE INTERPULSE COAX TIP (DISPOSABLE) ×1
HEAD CERAMIC 36 PLUS5 (Hips) ×1 IMPLANT
HOOD PEEL AWAY FLYTE STAYCOOL (MISCELLANEOUS) ×4 IMPLANT
IV NS IRRIG 3000ML ARTHROMATIC (IV SOLUTION) ×2 IMPLANT
JET LAVAGE IRRISEPT WOUND (IRRIGATION / IRRIGATOR) ×2
KIT BASIN OR (CUSTOM PROCEDURE TRAY) ×2 IMPLANT
LAVAGE JET IRRISEPT WOUND (IRRIGATION / IRRIGATOR) ×1 IMPLANT
LINER NEUTRAL 54X36MM PLUS 4 (Hips) ×1 IMPLANT
MARKER SKIN DUAL TIP RULER LAB (MISCELLANEOUS) ×2 IMPLANT
NDL SPNL 18GX3.5 QUINCKE PK (NEEDLE) ×1 IMPLANT
NEEDLE SPNL 18GX3.5 QUINCKE PK (NEEDLE) ×2 IMPLANT
PACK TOTAL JOINT (CUSTOM PROCEDURE TRAY) ×2 IMPLANT
PACK UNIVERSAL I (CUSTOM PROCEDURE TRAY) ×2 IMPLANT
RETRACTOR WND ALEXIS 18 MED (MISCELLANEOUS) IMPLANT
RTRCTR WOUND ALEXIS 18CM MED (MISCELLANEOUS)
SAW OSC TIP CART 19.5X105X1.3 (SAW) ×2 IMPLANT
SET HNDPC FAN SPRY TIP SCT (DISPOSABLE) ×1 IMPLANT
STAPLER VISISTAT 35W (STAPLE) IMPLANT
SUT ETHIBOND 2 V 37 (SUTURE) ×2 IMPLANT
SUT VIC AB 0 CT1 27 (SUTURE) ×1
SUT VIC AB 0 CT1 27XBRD ANBCTR (SUTURE) ×1 IMPLANT
SUT VIC AB 1 CTX 36 (SUTURE) ×1
SUT VIC AB 1 CTX36XBRD ANBCTR (SUTURE) ×1 IMPLANT
SUT VIC AB 2-0 CT1 27 (SUTURE) ×2
SUT VIC AB 2-0 CT1 TAPERPNT 27 (SUTURE) ×2 IMPLANT
SYR 50ML LL SCALE MARK (SYRINGE) ×2 IMPLANT
TOWEL GREEN STERILE (TOWEL DISPOSABLE) ×2 IMPLANT
TRAY FOLEY W/BAG SLVR 16FR (SET/KITS/TRAYS/PACK) ×1
TRAY FOLEY W/BAG SLVR 16FR ST (SET/KITS/TRAYS/PACK) ×1 IMPLANT
YANKAUER SUCT BULB TIP NO VENT (SUCTIONS) ×2 IMPLANT

## 2022-04-06 NOTE — Anesthesia Preprocedure Evaluation (Addendum)
Anesthesia Evaluation  Patient identified by MRN, date of birth, ID band Patient awake    Reviewed: Allergy & Precautions, NPO status , Patient's Chart, lab work & pertinent test results  Airway Mallampati: I  TM Distance: >3 FB Neck ROM: Full    Dental no notable dental hx. (+) Teeth Intact, Dental Advisory Given   Pulmonary asthma , sleep apnea and Continuous Positive Airway Pressure Ventilation ,    Pulmonary exam normal breath sounds clear to auscultation       Cardiovascular hypertension, Pt. on medications Normal cardiovascular exam Rhythm:Regular Rate:Normal     Neuro/Psych Seizures -, Well Controlled,  PSYCHIATRIC DISORDERS Depression    GI/Hepatic Neg liver ROS, GERD  Controlled and Medicated,  Endo/Other  Hypothyroidism   Renal/GU negative Renal ROS  negative genitourinary   Musculoskeletal  (+) Arthritis , Osteoarthritis,    Abdominal   Peds negative pediatric ROS (+)  Hematology negative hematology ROS (+) Hb 13.4, plt 277   Anesthesia Other Findings   Reproductive/Obstetrics negative OB ROS                             Anesthesia Physical Anesthesia Plan  ASA: 2  Anesthesia Plan: Spinal and MAC   Post-op Pain Management:    Induction:   PONV Risk Score and Plan: 2 and Propofol infusion and TIVA  Airway Management Planned: Natural Airway and Nasal Cannula  Additional Equipment: None  Intra-op Plan:   Post-operative Plan:   Informed Consent: I have reviewed the patients History and Physical, chart, labs and discussed the procedure including the risks, benefits and alternatives for the proposed anesthesia with the patient or authorized representative who has indicated his/her understanding and acceptance.       Plan Discussed with: CRNA  Anesthesia Plan Comments:         Anesthesia Quick Evaluation

## 2022-04-06 NOTE — H&P (Signed)
PREOPERATIVE H&P  Chief Complaint: LEFT HIP OSTEOARTHRITIS  HPI: Christy Todd is a 61 y.o. female who presents for surgical treatment of LEFT HIP OSTEOARTHRITIS.  She denies any changes in medical history.  Past Medical History:  Diagnosis Date   Arthritis    Asthma    COVID-19 07/23/2021   Depression    pt states she does not have depression. Cymbalta is used for pain control   GERD (gastroesophageal reflux disease)    Hypertension    Hypothyroid    Pre-diabetes    pt states she is no longer pre-diabetic due to weight loss   Sleep apnea    on CPAP   Wears hearing aid in right ear    Past Surgical History:  Procedure Laterality Date   ABDOMINAL HYSTERECTOMY     ANKLE RECONSTRUCTION     CHOLECYSTECTOMY     COLONOSCOPY  12/09/2018   Dr. [facility]elfino [facility]  3 mm sessile polyp, hepatic flexure. Mild - Moderate diverticulosis, sigmoid colon.   CYSTOCELE REPAIR     ESOPHAGOGASTRODUODENOSCOPY  12/09/2018   Dr. [facility]elf[facility]. Normal upper GI tract   RECTOCELE REPAIR     TENDON REPAIR     TOTAL ABDOMINAL HYSTERECTOMY     TOTAL HIP ARTHROPLASTY Right 08/25/2021   Procedure: RIGHT TOTAL HIP ARTHROPLASTY ANTERIOR APPROACH;  Surgeon: Leandrew Koyanagi, MD;  Location: [facility];  Service: Orthopedics;  Laterality: Right;  3-C   TUBAL LIGATION     Social History   Socioeconomic History   Marital status: Married    Spouse name: Annalaya Wile   Number of children: 2   Years of education: Not on file   Highest education level: Not on file  Occupational History   Not on file  Tobacco Use   Smoking status: Never   Smokeless tobacco: Never  Vaping Use   Vaping Use: Never used  Substance and Sexual Activity   Alcohol use: Not Currently   Drug use: Never   Sexual activity: Yes    Partners: Male  Other Topics Concern   Not on file  Social History Narrative   Not on file   Social Determinants of Health   Financial  Resource Strain: Not on file  Food Insecurity: Not on file  Transportation Needs: Not on file  Physical Activity: Not on file  Stress: Not on file  Social Connections: Not on file   Family History  Problem Relation Age of Onset   Hypertension Father    Pancreatic cancer Father    Diabetes Maternal Grandmother    Colon cancer Neg Hx    Stomach cancer Neg Hx    Esophageal cancer Neg Hx    Allergies  Allergen Reactions   Ciprofloxacin Other (See Comments)    Detatched muscles and tendons    Apple Juice     ALL form of apple  Chemical burn to mouth and gums   Gluten Meal Other (See Comments)    Gut Distress   Claritin [Loratadine] Other (See Comments)    allergic psychosis   Septra [Sulfamethoxazole-Trimethoprim] Rash   Prior to Admission medications   Medication Sig Start Date End Date Taking? Authorizing Provider  albuterol (VENTOLIN HFA) 108 (90 Base) MCG/ACT inhaler Inhale 2 puffs into the lungs every 6 (six) hours as needed for wheezing or shortness of breath. 07/23/21  Yes Brunetta Jeans, PA-C  Cholecalciferol (VITAMIN D3) 50 MCG (2000 UT) TABS Take 2,000 Units by mouth  daily.   Yes [provider]  DULoxetine (CYMBALTA) 30 MG capsule 2 BY MOUTH EVERY MORNING, 1 BY MOUTH EVERY AT NIGHT 11/24/21  Yes Luetta Nutting, DO  levothyroxine (SYNTHROID) 125 MCG tablet TAKE 1 TABLET BY MOUTH DAILY BEFORE BREAKFAST. 03/31/22   Luetta Nutting, DO  lisinopril (ZESTRIL) 2.5 MG tablet TAKE 1 TABLET BY MOUTH EVERY DAY 03/31/22   Luetta Nutting, DO  lisinopril (ZESTRIL) 20 MG tablet Take 20 mg by mouth daily.   Yes [provider]  Magnesium 200 MG TABS Take 200 mg by mouth daily.   Yes [provider]  montelukast (SINGULAIR) 10 MG tablet TAKE 1 TABLET BY MOUTH EVERY DAY 11/20/21  Yes Luetta Nutting, DO  NON FORMULARY Pt uses cpap nightly   Yes [provider]  omeprazole (PRILOSEC) 20 MG capsule Take 20 mg by mouth daily.   Yes [provider]   spironolactone (ALDACTONE) 50 MG tablet TAKE 1 TABLET BY MOUTH EVERY DAY 10/15/21  Yes Zigmund Daniel, [place], DO  WEGOVY 2.4 MG/0.75ML SOAJ Inject 2.4 mg into the skin once a week. 10/03/21  Yes Luetta Nutting, DO  amoxicillin-clavulanate (AUGMENTIN) 875-125 MG tablet Take 1 tablet by mouth 2 (two) times daily. Patient not taking: Reported on 03/23/2022 02/13/22   Luetta Nutting, DO  aspirin EC 81 MG tablet Take 1 tablet (81 mg total) by mouth 2 (two) times daily. To be taken after surgery to prevent blood clots Patient not taking: Reported on 03/23/2022 08/18/21   Aundra Dubin, PA-C  aspirin EC 81 MG tablet Take 1 tablet (81 mg total) by mouth 2 (two) times daily. To be taken after surgery to prevent blood clots 03/31/22 03/31/23  Aundra Dubin, PA-C  docusate sodium (COLACE) 100 MG capsule Take 1 capsule (100 mg total) by mouth daily as needed. 03/31/22 03/31/23  Aundra Dubin, PA-C  methocarbamol (ROBAXIN-750) 750 MG tablet Take 1 tablet (750 mg total) by mouth 2 (two) times daily as needed for muscle spasms. 03/31/22   Aundra Dubin, PA-C  ondansetron (ZOFRAN) 4 MG tablet Take 1 tablet (4 mg total) by mouth every 8 (eight) hours as needed for nausea or vomiting. 03/31/22   Aundra Dubin, PA-C  oxyCODONE-acetaminophen (PERCOCET) 7.5-325 MG tablet Take 1-2 tablets by mouth every 6 (six) hours as needed for severe pain. 04/05/22 04/05/23  Aundra Dubin, PA-C     Positive ROS: All other systems have been reviewed and were otherwise negative with the exception of those mentioned in the HPI and as above.  Physical Exam: General: Alert, no acute distress Cardiovascular: No pedal edema Respiratory: No cyanosis, no use of accessory musculature GI: abdomen soft Skin: No lesions in the area of chief complaint Neurologic: Sensation intact distally Psychiatric: Patient is competent for consent with normal mood and affect Lymphatic: no lymphedema  MUSCULOSKELETAL: exam stable  Assessment: LEFT HIP  OSTEOARTHRITIS  Plan: Plan for Procedure(s): LEFT TOTAL HIP ARTHROPLASTY ANTERIOR APPROACH  The risks benefits and alternatives were discussed with the patient including but not limited to the risks of nonoperative treatment, versus surgical intervention including infection, bleeding, nerve injury,  blood clots, cardiopulmonary complications, morbidity, mortality, among others, and they were willing to proceed.   Preoperative templating of the joint replacement has been completed, documented, and submitted to the Operating Room personnel in order to optimize intra-operative equipment management.   Eduard Roux, MD 04/06/2022 6:09 AM

## 2022-04-06 NOTE — Transfer of Care (Signed)
Immediate Anesthesia Transfer of Care Note  Patient: Christy Todd  Procedure(s) Performed: LEFT TOTAL HIP ARTHROPLASTY ANTERIOR APPROACH (Left: Hip)  Patient Location: PACU  Anesthesia Type:MAC combined with regional for post-op pain  Level of Consciousness: drowsy, patient cooperative and responds to stimulation  Airway & Oxygen Therapy: Patient Spontanous Breathing  Post-op Assessment: Report given to RN and Post -op Vital signs reviewed and stable  Post vital signs: Reviewed and stable  Last Vitals:  Vitals Value Taken Time  BP 98/56 04/06/22 0922  Temp    Pulse    Resp 16 04/06/22 0922  SpO2    Vitals shown include unvalidated device data.  Last Pain:  Vitals:   04/06/22 0555  TempSrc:   PainSc: 0-No pain         Complications: No notable events documented.

## 2022-04-06 NOTE — Op Note (Addendum)
LEFT TOTAL HIP ARTHROPLASTY ANTERIOR APPROACH  Procedure Note Christy Todd   [id number]  Pre-op Diagnosis: LEFT HIP OSTEOARTHRITIS     Post-op Diagnosis: same   Operative Procedures  1. Total hip replacement; Left hip; uncemented [id number]   Surgeon: Frankey Shown, M.D.  Assist: Madalyn Rob, PA-C   Anesthesia: spinal  Prosthesis: Depuy Acetabulum: Pinnacle 54 mm Femur: Actis 4 HO Head: 36 mm size: +5 Liner: +4 Bearing Type: ceramic/poly  Total Hip Arthroplasty (Anterior Approach) Op Note:  After informed consent was obtained and the operative extremity marked in the holding area, the patient was brought back to the operating room and placed supine on the HANA table. Next, the operative extremity was prepped and draped in normal sterile fashion. Surgical timeout occurred verifying patient identification, surgical site, surgical procedure and administration of antibiotics.  A modified anterior Smith-Peterson approach to the hip was performed, using the interval between tensor fascia lata and sartorius.  Dissection was carried bluntly down onto the anterior hip capsule. The lateral femoral circumflex vessels were identified and coagulated. A capsulotomy was performed and the capsular flaps tagged for later repair.  The neck osteotomy was performed. The femoral head was removed which showed severe wear, the acetabular rim was cleared of soft tissue and osteophytes and attention was turned to reaming the acetabulum.  Sequential reaming was performed under fluoroscopic guidance. We reamed to a size 53 mm, and then impacted the acetabular shell.  The liner was then placed after irrigation and attention turned to the femur.  After placing the femoral hook, the leg was taken to externally rotated, extended and adducted position taking care to perform soft tissue releases to allow for adequate mobilization of the femur. Soft tissue was cleared from the shoulder of the greater trochanter  and the hook elevator used to improve exposure of the proximal femur.  Due to distal potting of the broaches because her type A proximal femur anatomy, I used a reamer in the canal in order for the broaches to fit properly proximally.  Sequential broaching performed up to a size 4 which gave excellent proximal fit and rotational stabilty. Trial neck and head were placed. The leg was brought back up to neutral and the construct reduced.  Antibiotic irrigation was placed in the surgical wound.  The position and sizing of components, offset and leg lengths were checked using fluoroscopy. Stability of the construct was checked in extension and external rotation without any subluxation or impingement of prosthesis. We dislocated the prosthesis, dropped the leg back into position, removed trial components, and irrigated copiously. The final stem and some bone graft from the acetabular reaming was placed in the proximal portion of the femur and head was then placed, the leg brought back up, the system reduced and fluoroscopy used to verify positioning.  We irrigated, obtained hemostasis and closed the capsule using #2 ethibond suture.  One gram of vancomycin powder was placed in the surgical bed.   One gram of topical tranexamic acid was injected into the joint.  The fascia was closed with #1 vicryl plus, the deep fat layer was closed with 0 vicryl, the subcutaneous layers closed with 2.0 Vicryl Plus and the skin closed with 2.0 nylon and dermabond. A sterile dressing was applied. The patient was awakened in the operating room and taken to recovery in stable condition.  All sponge, needle, and instrument counts were correct at the end of the case.   Christy Todd, my PA, was a medical  necessity for opening, closing, limb positioning, retracting, exposing, and overall facilitation and timely completion of the surgery.  Position: supine  Complications: see description of procedure.  Time Out: performed    Drains/Packing: none  Estimated blood loss: see anesthesia record  Returned to Recovery Room: in good condition.   Antibiotics: yes   Mechanical VTE (DVT) Prophylaxis: sequential compression devices, TED thigh-high  Chemical VTE (DVT) Prophylaxis: aspirin   Fluid Replacement: see anesthesia record  Specimens Removed: 1 to pathology   Sponge and Instrument Count Correct? yes   PACU: portable radiograph - low AP   Plan/RTC: Return in 2 weeks for staple removal. Weight Bearing/Load Lower Extremity: full  Hip precautions: none Suture Removal: 2 weeks   N. Eduard Roux, MD [facility] 8:50 AM   Implant Name Type Inv. Item Serial No. Manufacturer Lot No. LRB No. Used Action  Enzo Montgomery 54MM - [id number] Plate ACETAB CUP W GRIPTION 54MM  [employer] [id number] Left 1 Implanted  LINER NEUTRAL 54X36MM PLUS 4 - [id number] Hips LINER NEUTRAL 54X36MM PLUS 4  [employer]PU[employer] ORTH[employer] [id number] Left 1 Implanted  FEM STEM 12/14 TAPER SZ 4 HIP - [id number] Orthopedic Implant FEM STEM 12/14 TAPER SZ 4 HIP  [employer]PU[employer]TH[employer] [id number] Left 1 Implanted  HEAD CERAMIC 36 PLUS5 - [id number] Hips HEAD CERAMIC 36 PLUS5  DEPU[employer] ORTH[employer] [id number] Left 1 Implanted

## 2022-04-06 NOTE — Anesthesia Postprocedure Evaluation (Signed)
Anesthesia Post Note  Patient: Christy Todd  Procedure(s) Performed: LEFT TOTAL HIP ARTHROPLASTY ANTERIOR APPROACH (Left: Hip)     Patient location during evaluation: PACU Anesthesia Type: MAC Level of consciousness: awake and alert Pain management: pain level controlled Vital Signs Assessment: post-procedure vital signs reviewed and stable Respiratory status: spontaneous breathing, nonlabored ventilation and respiratory function stable Cardiovascular status: blood pressure returned to baseline and stable Postop Assessment: no apparent nausea or vomiting Anesthetic complications: no   No notable events documented.  Last Vitals:  Vitals:   04/06/22 1007 04/06/22 1023  BP: 110/67 98/74  Pulse: 62 74  Resp: 17 20  Temp:  36.8 C  SpO2: 99% 100%    Last Pain:  Vitals:   04/06/22 1023  TempSrc:   PainSc: 0-No pain            L Sensory Level: S1-Sole of foot, small toes (04/06/22 1023) R Sensory Level: S1-Sole of foot, small toes (04/06/22 1023)  Lynda Rainwater

## 2022-04-06 NOTE — Discharge Instructions (Signed)

## 2022-04-06 NOTE — Evaluation (Signed)
Physical Therapy Evaluation Patient Details Name: Christy Todd MRN: [id number] DOB: 01-24-1961 Today's Date: 04/06/2022  History of Present Illness  Pt is a 61 y/o female s/p L THA. PMH includes R THA, HTN, and pre-diabetes.  Clinical Impression  Pt is s/p surgery above with deficits below. Pt requiring min A to perform bed mobility tasks. Pt with increased dizziness which limited further mobility. Anticipate pt will progress well once symptoms improve. Will continue to follow acutely.        Recommendations for follow up therapy are one component of a multi-disciplinary discharge planning process, led by the attending physician.  Recommendations may be updated based on patient status, additional functional criteria and insurance authorization.  Follow Up Recommendations Follow physician's recommendations for discharge plan and follow up therapies    Assistance Recommended at Discharge Intermittent Supervision/Assistance  Patient can return home with the following  Help with stairs or ramp for entrance;Assist for transportation;Assistance with cooking/housework    Equipment Recommendations None recommended by PT  Recommendations for Other Services       Functional Status Assessment Patient has had a recent decline in their functional status and demonstrates the ability to make significant improvements in function in a reasonable and predictable amount of time.     Precautions / Restrictions Precautions Precautions: Fall Restrictions Weight Bearing Restrictions: Yes LLE Weight Bearing: Weight bearing as tolerated      Mobility  Bed Mobility Overal bed mobility: Needs Assistance Bed Mobility: Supine to Sit, Sit to Supine     Supine to sit: Min assist Sit to supine: Min assist   General bed mobility comments: Assist for LLE assist. Pt with increased dizziness, so unable to stand, but was able to scoot up towards [place].    Transfers                         Ambulation/Gait                  Stairs            Wheelchair Mobility    Modified Rankin (Stroke Patients Only)       Balance Overall balance assessment: Needs assistance Sitting-balance support: No upper extremity supported, Feet supported Sitting balance-Leahy Scale: Fair                                       Pertinent Vitals/Pain Pain Assessment Pain Assessment: Faces Faces Pain Scale: Hurts even more Pain Location: L hip Pain Descriptors / Indicators: Grimacing, Guarding Pain Intervention(s): Monitored during session, Limited activity within patient's tolerance, Repositioned    Home Living Family/patient expects to be discharged to:: Private residence Living Arrangements: Spouse/significant other Available Help at Discharge: Family Type of Home: House Home Access: Stairs to enter Entrance Stairs-Rails: None Entrance Stairs-Number of Steps: 1   Home Layout: Able to live on main level with bedroom/bathroom Home Equipment: Conservation officer, nature (2 wheels)      Prior Function Prior Level of Function : Independent/Modified Independent                     Hand Dominance        Extremity/Trunk Assessment   Upper Extremity Assessment Upper Extremity Assessment: Overall WFL for tasks assessed    Lower Extremity Assessment Lower Extremity Assessment: LLE deficits/detail LLE Deficits / Details: deficits consistent with post op pain and  weakness.    Cervical / Trunk Assessment Cervical / Trunk Assessment: Normal  Communication   Communication: No difficulties  Cognition Arousal/Alertness: Suspect due to medications Behavior During Therapy: WFL for tasks assessed/performed Overall Cognitive Status: Within Functional Limits for tasks assessed                                          General Comments      Exercises     Assessment/Plan    PT Assessment Patient needs continued PT services  PT Problem  List Decreased strength;Decreased range of motion;Decreased activity tolerance;Decreased balance;Decreased mobility;Pain       PT Treatment Interventions DME instruction;Gait training;Stair training;Therapeutic activities;Functional mobility training;Therapeutic exercise;Balance training;Patient/family education    PT Goals (Current goals can be found in the Care Plan section)  Acute Rehab PT Goals Patient Stated Goal: to feel better PT Goal Formulation: With patient Time For Goal Achievement: 04/20/22 Potential to Achieve Goals: Good    Frequency 7X/week     Co-evaluation               AM-PAC PT "6 Clicks" Mobility  Outcome Measure Help needed turning from your back to your side while in a flat bed without using bedrails?: A Little Help needed moving from lying on your back to sitting on the side of a flat bed without using bedrails?: A Little Help needed moving to and from a bed to a chair (including a wheelchair)?: A Little Help needed standing up from a chair using your arms (e.g., wheelchair or bedside chair)?: A Little Help needed to walk in hospital room?: A Little Help needed climbing 3-5 steps with a railing? : A Little 6 Click Score: 18    End of Session   Activity Tolerance: Treatment limited secondary to medical complications (Comment) (dizziness) Patient left: in bed;with call bell/phone within reach Nurse Communication: Mobility status PT Visit Diagnosis: Other abnormalities of gait and mobility (R26.89);Pain Pain - Right/Left: Left Pain - part of body: Hip    Time: 1354-1410 PT Time Calculation (min) (ACUTE ONLY): 16 min   Charges:   PT Evaluation $PT Eval Low Complexity: 1 Low          Reuel Derby, PT, DPT  Acute Rehabilitation Services  Office: [phone]   Rudean Hitt 04/06/2022, 3:01 PM

## 2022-04-06 NOTE — Anesthesia Procedure Notes (Signed)
Procedure Name: MAC Date/Time: 04/06/2022 7:19 AM Performed by: Janace Litten, CRNA Pre-anesthesia Checklist: Patient identified, Emergency Drugs available, Suction available and Patient being monitored Patient Re-evaluated:Patient Re-evaluated prior to induction Oxygen Delivery Method: Simple face mask

## 2022-04-06 NOTE — Anesthesia Procedure Notes (Signed)
Spinal  Patient location during procedure: OR Start time: 04/06/2022 7:17 AM End time: 04/06/2022 7:22 AM Reason for block: surgical anesthesia Staffing Performed: anesthesiologist  Anesthesiologist: Lynda Rainwater, MD Preanesthetic Checklist Completed: patient identified, IV checked, site marked, risks and benefits discussed, surgical consent, monitors and equipment checked, pre-op evaluation and timeout performed Spinal Block Patient position: sitting Prep: DuraPrep Patient monitoring: heart rate, cardiac monitor, continuous pulse ox and blood pressure Approach: midline Location: L3-4 Injection technique: single-shot Needle Needle type: Sprotte  Needle gauge: 24 G Needle length: 9 cm Assessment Sensory level: T4 Events: CSF return

## 2022-04-07 ENCOUNTER — Other Ambulatory Visit (HOSPITAL_COMMUNITY): Payer: Self-pay

## 2022-04-07 ENCOUNTER — Other Ambulatory Visit: Payer: Self-pay | Admitting: Physician Assistant

## 2022-04-07 ENCOUNTER — Telehealth: Payer: Self-pay | Admitting: Physician Assistant

## 2022-04-07 ENCOUNTER — Encounter (HOSPITAL_COMMUNITY): Payer: Self-pay | Admitting: Orthopaedic Surgery

## 2022-04-07 DIAGNOSIS — M1612 Unilateral primary osteoarthritis, left hip: Secondary | ICD-10-CM | POA: Diagnosis not present

## 2022-04-07 LAB — CBC
HCT: 37.6 % (ref 36.0–46.0)
Hemoglobin: 11.9 g/dL — ABNORMAL LOW (ref 12.0–15.0)
MCH: 28.1 pg (ref 26.0–34.0)
MCHC: 31.6 g/dL (ref 30.0–36.0)
MCV: 88.9 fL (ref 80.0–100.0)
Platelets: 229 10*3/uL (ref 150–400)
RBC: 4.23 MIL/uL (ref 3.87–5.11)
RDW: 13.6 % (ref 11.5–15.5)
WBC: 6.8 10*3/uL (ref 4.0–10.5)
nRBC: 0 % (ref 0.0–0.2)

## 2022-04-07 MED ORDER — OXYCODONE-ACETAMINOPHEN 5-325 MG PO TABS
1.0000 | ORAL_TABLET | Freq: Four times a day (QID) | ORAL | 0 refills | Status: DC | PRN
  Filled 2022-04-07: qty 40, 5d supply, fill #0

## 2022-04-07 NOTE — Progress Notes (Signed)
Subjective: 1 Day Post-Op Procedure(s) (LRB): LEFT TOTAL HIP ARTHROPLASTY ANTERIOR APPROACH (Left) Patient reports pain as mild.    Objective: Vital signs in last 24 hours: Temp:  [97.9 F (36.6 C)-98.5 F (36.9 C)] 98.5 F (36.9 C) (06/06 0738) Pulse Rate:  [58-94] 84 (06/06 0738) Resp:  [14-20] 16 (06/06 0738) BP: (96-151)/(56-92) 124/74 (06/06 0738) SpO2:  [97 %-100 %] 98 % (06/06 0738)  Intake/Output from previous day: 06/05 0701 - 06/06 0700 In: 1720 [P.O.:120; I.V.:1300; IV Piggyback:300] Out: 2150 [Urine:2000; Blood:150] Intake/Output this shift: No intake/output data recorded.  Recent Labs    04/07/22 0606  HGB 11.9*   Recent Labs    04/07/22 0606  WBC 6.8  RBC 4.23  HCT 37.6  PLT 229   No results for input(s): NA, K, CL, CO2, BUN, CREATININE, GLUCOSE, CALCIUM in the last 72 hours. No results for input(s): LABPT, INR in the last 72 hours.  Neurologically intact Neurovascular intact Sensation intact distally Intact pulses distally Dorsiflexion/Plantar flexion intact Incision: dressing C/D/I No cellulitis present Compartment soft   Assessment/Plan: 1 Day Post-Op Procedure(s) (LRB): LEFT TOTAL HIP ARTHROPLASTY ANTERIOR APPROACH (Left) Advance diet Up with therapy D/C IV fluids Discharge home with home health WBAT LLE ABLA- mild and stable      Aundra Dubin 04/07/2022, 7:58 AM

## 2022-04-07 NOTE — Progress Notes (Signed)
CPAP set up at bedside, auto titrate 16-14 per patient home settings with patient nasal pillows. Patient able to place self on/off as needed. Family member at bedside for assistance also. RT will monitor as needed. RN aware.

## 2022-04-07 NOTE — Discharge Summary (Signed)
Patient ID: Christy Todd MRN: [id number] DOB/AGE: 07-Jul-1961 61 y.o.  Admit date: 04/06/2022 Discharge date: 04/07/2022  Admission Diagnoses:  Principal Problem:   Primary osteoarthritis of left hip Active Problems:   Status post total replacement of left hip   Discharge Diagnoses:  Same  Past Medical History:  Diagnosis Date   Arthritis    Asthma    COVID-19 07/23/2021   Depression    pt states she does not have depression. Cymbalta is used for pain control   GERD (gastroesophageal reflux disease)    Hypertension    Hypothyroid    Pre-diabetes    pt states she is no longer pre-diabetic due to weight loss   Sleep apnea    on CPAP   Wears hearing aid in right ear     Surgeries: Procedure(s): LEFT TOTAL HIP ARTHROPLASTY ANTERIOR APPROACH on 04/06/2022   Consultants:   Discharged Condition: Improved  Hospital Course: Christy Todd is an 61 y.o. female who was admitted 04/06/2022 for operative treatment ofPrimary osteoarthritis of left hip. Patient has severe unremitting pain that affects sleep, daily activities, and work/hobbies. After pre-op clearance the patient was taken to the operating room on 04/06/2022 and underwent  Procedure(s): LEFT TOTAL HIP ARTHROPLASTY ANTERIOR APPROACH.    Patient was given perioperative antibiotics:  Anti-infectives (From admission, onward)    Start     Dose/Rate Route Frequency Ordered Stop   04/06/22 1330  ceFAZolin (ANCEF) IVPB 2g/100 mL premix        2 g 200 mL/hr over 30 Minutes Intravenous Every 6 hours 04/06/22 1046 04/06/22 2012   04/06/22 0801  vancomycin (VANCOCIN) powder  Status:  Discontinued          As needed 04/06/22 0801 04/06/22 0917   04/06/22 0600  ceFAZolin (ANCEF) IVPB 2g/100 mL premix        2 g 200 mL/hr over 30 Minutes Intravenous On call to O.R. 04/06/22 0547 04/06/22 0725        Patient was given sequential compression devices, early ambulation, and chemoprophylaxis to prevent DVT.  Patient benefited  maximally from hospital stay and there were no complications.    Recent vital signs: Patient Vitals for the past 24 hrs:  BP Temp Temp src Pulse Resp SpO2  04/07/22 0738 124/74 98.5 F (36.9 C) Oral 84 16 98 %  04/07/22 0518 126/77 98.1 F (36.7 C) Oral 88 18 99 %  04/06/22 2135 -- -- -- 92 18 98 %  04/06/22 2026 (!) 142/76 98 F (36.7 C) Oral 94 18 99 %  04/06/22 1729 (!) 151/92 98.2 F (36.8 C) Oral 83 16 100 %  04/06/22 1043 109/76 97.9 F (36.6 C) -- (!) 59 18 100 %  04/06/22 1023 98/74 98.3 F (36.8 C) -- 74 20 100 %  04/06/22 1007 110/67 -- -- 62 17 99 %  04/06/22 0951 112/72 -- -- (!) 58 17 97 %  04/06/22 0936 96/60 -- -- 67 14 100 %  04/06/22 0922 (!) 98/56 98.3 F (36.8 C) -- 77 16 100 %     Recent laboratory studies:  Recent Labs    04/07/22 0606  WBC 6.8  HGB 11.9*  HCT 37.6  PLT 229     Discharge Medications:   Allergies as of 04/07/2022       Reactions   Ciprofloxacin Other (See Comments)   Detatched muscles and tendons    Apple Juice    ALL form of apple  Chemical burn to mouth  and gums   Gluten Meal Other (See Comments)   Gut Distress   Claritin [loratadine] Other (See Comments)   allergic psychosis   Septra [sulfamethoxazole-trimethoprim] Rash        Medication List     TAKE these medications    albuterol 108 (90 Base) MCG/ACT inhaler Commonly known as: VENTOLIN HFA Inhale 2 puffs into the lungs every 6 (six) hours as needed for wheezing or shortness of breath.   aspirin EC 81 MG tablet Take 1 tablet (81 mg total) by mouth 2 (two) times daily. To be taken after surgery to prevent blood clots   docusate sodium 100 MG capsule Commonly known as: Colace Take 1 capsule (100 mg total) by mouth daily as needed.   DULoxetine 30 MG capsule Commonly known as: CYMBALTA 2 BY MOUTH EVERY MORNING, 1 BY MOUTH EVERY AT NIGHT   levothyroxine 125 MCG tablet Commonly known as: SYNTHROID TAKE 1 TABLET BY MOUTH DAILY BEFORE BREAKFAST.   lisinopril  20 MG tablet Commonly known as: ZESTRIL Take 20 mg by mouth daily. What changed: Another medication with the same name was changed. Make sure you understand how and when to take each.   lisinopril 2.5 MG tablet Commonly known as: ZESTRIL TAKE 1 TABLET BY MOUTH EVERY DAY What changed:  when to take this reasons to take this   Magnesium 200 MG Tabs Take 200 mg by mouth daily.   methocarbamol 750 MG tablet Commonly known as: Robaxin-750 Take 1 tablet (750 mg total) by mouth 2 (two) times daily as needed for muscle spasms.   montelukast 10 MG tablet Commonly known as: SINGULAIR TAKE 1 TABLET BY MOUTH EVERY DAY   NON FORMULARY Pt uses cpap nightly   omeprazole 20 MG capsule Commonly known as: PRILOSEC Take 20 mg by mouth daily.   ondansetron 4 MG tablet Commonly known as: Zofran Take 1 tablet (4 mg total) by mouth every 8 (eight) hours as needed for nausea or vomiting.   oxyCODONE-acetaminophen 5-325 MG tablet Commonly known as: Percocet Take 1-2 tablets by mouth every 6 (six) hours as needed for severe pain.   spironolactone 50 MG tablet Commonly known as: ALDACTONE TAKE 1 TABLET BY MOUTH EVERY DAY   Vitamin D3 50 MCG (2000 UT) Tabs Take 2,000 Units by mouth daily.   Wegovy 2.4 MG/0.75ML Soaj Generic drug: Semaglutide-Weight Management Inject 2.4 mg into the skin once a week.               Durable Medical Equipment  (From admission, onward)           Start     Ordered   04/06/22 1047  DME Walker rolling  Once       Question:  Patient needs a walker to treat with the following condition  Answer:  History of hip replacement   04/06/22 1046   04/06/22 1047  DME 3 n 1  Once        04/06/22 1046   04/06/22 1047  DME Bedside commode  Once       Question:  Patient needs a bedside commode to treat with the following condition  Answer:  History of hip replacement   04/06/22 1046            Diagnostic Studies: DG Pelvis Portable  Result Date:  04/06/2022 CLINICAL DATA:  Postop left hip arthroplasty EXAM: PORTABLE PELVIS 1-2 VIEWS COMPARISON:  02/20/2022 FINDINGS: Left total hip arthroplasty changes noted. Components appear aligned in the frontal plane.  Remote right hip arthroplasty. Bony pelvis intact. No acute osseous finding or hardware abnormality. Expected postoperative changes of the left hip soft tissues with scattered soft tissue air noted. IMPRESSION: Expected appearance status post recent left total hip arthroplasty. No complicating feature by plain radiography. Electronically Signed   By: Jerilynn Mages.  Shick M.D.   On: 04/06/2022 09:47   DG C-Arm 1-60 Min-No Report  Result Date: 04/06/2022 Fluoroscopy was utilized by the requesting physician.  No radiographic interpretation.   DG C-Arm 1-60 Min-No Report  Result Date: 04/06/2022 Fluoroscopy was utilized by the requesting physician.  No radiographic interpretation.   DG HIP UNILAT WITH PELVIS 1V LEFT  Result Date: 04/06/2022 CLINICAL DATA:  Primary osteoarthritis of the left hip, left hip arthroplasty EXAM: DG HIP (WITH OR WITHOUT PELVIS) 1V*L* COMPARISON:  02/20/2022 FINDINGS: Spot fluoroscopic intraoperative views in the frontal plane demonstrate left total hip arthroplasty changes. Components appear aligned in the frontal plane. No complicating feature expected postoperative appearance. Remote right hip arthroplasty noted. IMPRESSION: Expected appearance status post left total hip arthroplasty. Electronically Signed   By: Jerilynn Mages.  Shick M.D.   On: 04/06/2022 08:57    Disposition: Discharge disposition: 01-Home or Self Care          Follow-up Information     Leandrew Koyanagi, MD. Schedule an appointment as soon as possible for a visit in 2 week(s).   Specialty: Orthopedic Surgery Contact information: [address] [place] [address] [phone]                  Signed: Aundra Dubin 04/07/2022, 7:59 AM

## 2022-04-07 NOTE — Plan of Care (Signed)
Pt is doing well. Pt and husband given D/C instructions with verbal understanding. Rx's were sent to the pharmacy by MD. Pt's incision is clean and dry with no sign of infection. Pt's IV was removed prior to D/C. Pt D/C'd home via wheelchair per MD order. Pt is stable @ D/C and has no other needs at this time. Holli Humbles, RN

## 2022-04-07 NOTE — Progress Notes (Signed)
Physical Therapy Treatment Patient Details Name: Christy Todd MRN: [id number] DOB: 1961/08/25 Today's Date: 04/07/2022   History of Present Illness Pt is a 62 y/o female s/p L THA on 04/06/22 . PMH includes R THA on 08/25/21, HTN, and pre-diabetes.    PT Comments    Pt demonstrated increased tolerance to activity this session with no complaints of dizziness or nausea. Pt was able to ambulate 167f with minA using RW and negotiate 1 step with b rails and min guard to ensure safe entry into home. Pt verbalized understanding of HEP and progressive mobility upon discharge. Pts daughter is a physical therapist and pt states she will be available to help. Pt and husband with no further questions at this time.    Recommendations for follow up therapy are one component of a multi-disciplinary discharge planning process, led by the attending physician.  Recommendations may be updated based on patient status, additional functional criteria and insurance authorization.  Follow Up Recommendations  Follow physician's recommendations for discharge plan and follow up therapies     Assistance Recommended at Discharge Frequent or constant Supervision/Assistance  Patient can return home with the following A little help with walking and/or transfers;A little help with bathing/dressing/bathroom;Assistance with cooking/housework;Assist for transportation;Help with stairs or ramp for entrance   Equipment Recommendations  None recommended by PT    Recommendations for Other Services       Precautions / Restrictions Precautions Precautions: Fall Restrictions Weight Bearing Restrictions: Yes LLE Weight Bearing: Weight bearing as tolerated     Mobility  Bed Mobility Overal bed mobility: Modified Independent Bed Mobility: Supine to Sit       Sit to supine: Min assist   General bed mobility comments: Increased time, HOB elevated, no manual assist required    Transfers Overall transfer level: Needs  assistance Equipment used: Rolling walker (2 wheels) Transfers: Sit to/from Stand Sit to Stand: Min guard   Step pivot transfers: Min assist       General transfer comment: Pt demonstrated good technique and hand placement, min guard for safety    Ambulation/Gait Ambulation/Gait assistance: Min assist Gait Distance (Feet): 150 Feet Assistive device: Rolling walker (2 wheels) Gait Pattern/deviations: Step-through pattern, Decreased step length - right, Decreased stance time - left, Narrow base of support Gait velocity: decreased Gait velocity interpretation: 1.31 - 2.62 ft/sec, indicative of limited community ambulator   General Gait Details: VCs for step-through pattern, demonstrated good heel strike. Increased reliance on RW to unweight LLE. Gait belt assist during L stance phase for balance. Multiple standing rest breaks.   Stairs Stairs: Yes Stairs assistance: Min guard Stair Management: Two rails, Step to pattern Number of Stairs: 1 General stair comments: VCs for step to pattern, pt able to recall pattern from previous surgery. Demonstrated good technique and safety   Wheelchair Mobility    Modified Rankin (Stroke Patients Only)       Balance Overall balance assessment: Needs assistance Sitting-balance support: No upper extremity supported, Feet supported Sitting balance-Leahy Scale: Good Sitting balance - Comments: Sat EOB with no UE support and good posture   Standing balance support: Bilateral upper extremity supported, Reliant on assistive device for balance Standing balance-Leahy Scale: Poor Standing balance comment: reliance on RW for support, narrow BOS                            Cognition Arousal/Alertness: Awake/alert Behavior During Therapy: WFL for tasks assessed/performed, Anxious Overall Cognitive Status:  Within Functional Limits for tasks assessed                                          Exercises Total Joint  Exercises Heel Slides: AAROM, 10 reps Long Arc Quad: 10 reps    General Comments        Pertinent Vitals/Pain Pain Assessment Pain Assessment: Faces Faces Pain Scale: Hurts a little bit Pain Location: L hip Pain Descriptors / Indicators: Grimacing, Guarding Pain Intervention(s): Limited activity within patient's tolerance, Monitored during session    Home Living                          Prior Function            PT Goals (current goals can now be found in the care plan section) Acute Rehab PT Goals Patient Stated Goal: to feel better PT Goal Formulation: With patient Time For Goal Achievement: 04/20/22 Potential to Achieve Goals: Good Progress towards PT goals: Progressing toward goals    Frequency    7X/week      PT Plan Current plan remains appropriate    Co-evaluation              AM-PAC PT "6 Clicks" Mobility   Outcome Measure  Help needed turning from your back to your side while in a flat bed without using bedrails?: None Help needed moving from lying on your back to sitting on the side of a flat bed without using bedrails?: None Help needed moving to and from a bed to a chair (including a wheelchair)?: A Little Help needed standing up from a chair using your arms (e.g., wheelchair or bedside chair)?: A Little Help needed to walk in hospital room?: A Little Help needed climbing 3-5 steps with a railing? : A Little 6 Click Score: 20    End of Session Equipment Utilized During Treatment: Gait belt Activity Tolerance: Patient tolerated treatment well Patient left: in bed;with call bell/phone within reach;with family/visitor present Nurse Communication: Mobility status PT Visit Diagnosis: Other abnormalities of gait and mobility (R26.89);Pain Pain - Right/Left: Left Pain - part of body: Hip     Time: 3976-7341 PT Time Calculation (min) (ACUTE ONLY): 17 min  Charges:  $Gait Training: 8-22 mins $Therapeutic Activity: 8-22 mins                     Mackie Pai, SPT Acute Rehabilitation Services  Office: [phone]    Mackie Pai 04/07/2022, 1:11 PM

## 2022-04-07 NOTE — Telephone Encounter (Signed)
Christy Todd- with [facility] cant find anyone that will accept patient for [place]

## 2022-04-07 NOTE — Progress Notes (Signed)
Physical Therapy Treatment Patient Details Name: Christy Todd MRN: [id number] DOB: 1961-01-04 Today's Date: 04/07/2022   History of Present Illness Pt is a 61 y/o female s/p L THA on 04/06/22 . PMH includes R THA on 08/25/21, HTN, and pre-diabetes.    PT Comments    Pt mobility limited this session due to complaints of nausea and dizziness when standing. RN notified and medication administered during session. RN reports pt has had stable vitals despite complaints. Continued mobilization with chair follow for safety. Pt required multiple seated rest breaks due to reports of nausea. Symptoms resolved with rest breaks but pt requested to return to bed and try with therapy again later. Therapy will follow up with patient this afternoon as time allows to progress ambulation and stair training.    Recommendations for follow up therapy are one component of a multi-disciplinary discharge planning process, led by the attending physician.  Recommendations may be updated based on patient status, additional functional criteria and insurance authorization.  Follow Up Recommendations  Follow physician's recommendations for discharge plan and follow up therapies     Assistance Recommended at Discharge Frequent or constant Supervision/Assistance  Patient can return home with the following A little help with walking and/or transfers;A little help with bathing/dressing/bathroom;Assistance with cooking/housework;Assist for transportation;Help with stairs or ramp for entrance   Equipment Recommendations  None recommended by PT    Recommendations for Other Services       Precautions / Restrictions Precautions Precautions: Fall Restrictions Weight Bearing Restrictions: Yes LLE Weight Bearing: Weight bearing as tolerated     Mobility  Bed Mobility Overal bed mobility: Modified Independent Bed Mobility: Supine to Sit       Sit to supine: Min assist   General bed mobility comments: Increased time,  HOB elevated, no manual assist required    Transfers Overall transfer level: Needs assistance Equipment used: Rolling walker (2 wheels) Transfers: Sit to/from Stand Sit to Stand: Min guard   Step pivot transfers: Min assist       General transfer comment: Pt demonstrated good technique and hand placement, min guard for safety    Ambulation/Gait Ambulation/Gait assistance: Min assist Gait Distance (Feet): 150 Feet Assistive device: Rolling walker (2 wheels) Gait Pattern/deviations: Step-through pattern, Decreased step length - right, Decreased stance time - left, Narrow base of support Gait velocity: decreased Gait velocity interpretation: 1.31 - 2.62 ft/sec, indicative of limited community ambulator   General Gait Details: VCs for step-through pattern, demonstrated good heel strike. Increased reliance on RW to unweight LLE. Gait belt assist during L stance phase for balance. Multiple standing rest breaks.   Stairs Stairs: Yes Stairs assistance: Min guard Stair Management: Two rails, Step to pattern Number of Stairs: 1 General stair comments: VCs for step to pattern, pt able to recall pattern from previous surgery. Demonstrated good technique and safety   Wheelchair Mobility    Modified Rankin (Stroke Patients Only)       Balance Overall balance assessment: Needs assistance Sitting-balance support: No upper extremity supported, Feet supported Sitting balance-Leahy Scale: Good Sitting balance - Comments: Sat EOB with no UE support and good posture   Standing balance support: Bilateral upper extremity supported, Reliant on assistive device for balance Standing balance-Leahy Scale: Poor Standing balance comment: reliance on RW for support, narrow BOS                            Cognition Arousal/Alertness: Awake/alert Behavior During Therapy: [facility]  for tasks assessed/performed, Anxious Overall Cognitive Status: Within Functional Limits for tasks assessed                                           Exercises Total Joint Exercises Heel Slides: AAROM, 10 reps Long Arc Quad: 10 reps    General Comments        Pertinent Vitals/Pain Pain Assessment Pain Assessment: Faces Faces Pain Scale: Hurts a little bit Pain Location: L hip Pain Descriptors / Indicators: Grimacing, Guarding Pain Intervention(s): Limited activity within patient's tolerance, Monitored during session    Home Living                          Prior Function            PT Goals (current goals can now be found in the care plan section) Acute Rehab PT Goals Patient Stated Goal: to feel better PT Goal Formulation: With patient Time For Goal Achievement: 04/20/22 Potential to Achieve Goals: Good Progress towards PT goals: Progressing toward goals    Frequency    7X/week      PT Plan Current plan remains appropriate    Co-evaluation              AM-PAC PT "6 Clicks" Mobility   Outcome Measure  Help needed turning from your back to your side while in a flat bed without using bedrails?: None Help needed moving from lying on your back to sitting on the side of a flat bed without using bedrails?: None Help needed moving to and from a bed to a chair (including a wheelchair)?: A Little Help needed standing up from a chair using your arms (e.g., wheelchair or bedside chair)?: A Little Help needed to walk in hospital room?: A Little Help needed climbing 3-5 steps with a railing? : A Little 6 Click Score: 20    End of Session Equipment Utilized During Treatment: Gait belt Activity Tolerance: Patient tolerated treatment well Patient left: in bed;with call bell/phone within reach;with family/visitor present Nurse Communication: Mobility status PT Visit Diagnosis: Other abnormalities of gait and mobility (R26.89);Pain Pain - Right/Left: Left Pain - part of body: Hip     Time: 4174-0814 PT Time Calculation (min) (ACUTE ONLY): 17  min  Charges:  $Gait Training: 8-22 mins $Therapeutic Activity: 8-22 mins                    Mackie Pai, SPT Acute Rehabilitation Services  Office: [phone]    Mackie Pai 04/07/2022, 1:10 PM

## 2022-04-07 NOTE — TOC Transition Note (Signed)
Transition of Care [facility]) - CM/SW Discharge Note   Patient Details  Name: Christy Todd MRN: [id number] Date of Birth: 20-Jan-1961  Transition of Care [facility] CM/SW Contact:  Pollie Friar, RN Phone Number: 04/07/2022, 11:19 AM   Clinical Narrative:    Patient discharging home with self care. Pt with orders for home health PT but CM unable to find a [facility] agency to accept. CM has left voicemail with MD office to update on no HH services after d/c. Pt has transportation home.   Final next level of care: Home/Self Care Barriers to Discharge: No Barriers Identified   Patient Goals and CMS Choice        Discharge Placement                       Discharge Plan and Services                                     Social Determinants of Health (SDOH) Interventions     Readmission Risk Interventions     View : No data to display.

## 2022-04-16 ENCOUNTER — Encounter: Payer: Self-pay | Admitting: Orthopaedic Surgery

## 2022-04-21 ENCOUNTER — Encounter: Payer: Self-pay | Admitting: Orthopaedic Surgery

## 2022-04-21 ENCOUNTER — Ambulatory Visit (INDEPENDENT_AMBULATORY_CARE_PROVIDER_SITE_OTHER): Payer: Managed Care, Other (non HMO) | Admitting: Orthopaedic Surgery

## 2022-04-21 ENCOUNTER — Telehealth: Payer: Self-pay | Admitting: Orthopaedic Surgery

## 2022-04-21 DIAGNOSIS — Z96642 Presence of left artificial hip joint: Secondary | ICD-10-CM

## 2022-04-21 NOTE — Telephone Encounter (Signed)
Gait training, balance, ambulation, gentle strengthening.

## 2022-04-21 NOTE — Telephone Encounter (Signed)
Vicente Males (PT) from [facility] called for pt orders. Please call Vicente Males at [phone].

## 2022-04-21 NOTE — Progress Notes (Signed)
Post-Op Visit Note   Patient: Christy Todd           Date of Birth: 1961-09-08           MRN: [id number] Visit Date: 04/21/2022 PCP: Luetta Nutting, DO   Assessment & Plan:  Chief Complaint:  Chief Complaint  Patient presents with   Left Hip - Follow-up    Left total hip arthroplasty 04/06/2022   Visit Diagnoses:  1. Status post total replacement of left hip     Plan: Patient is a pleasant 61 year old female who comes in today 2 weeks status post left total hip replacement 04/06/2022.  She is doing well.  No pain.  She has been taking her baby aspirin once daily.  [facility] and is ambulating with a walker out in public.  Examination of the left hip reveals a well-healed surgical incision with nylon sutures in place.  No evidence of infection or cellulitis.  She does have a small seroma.  This is nontender.  No erythema or signs of infection.  Calves are soft and nontender.  Today, sutures were removed and Steri-Strips applied.  I recommended ice and heat for the seroma and have discussed with her that if this gets bigger or causes pain to call us.  She will start taking her baby aspirin twice daily for the next 4 weeks for DVT prophylaxis.  Dental prophylaxis recommended.  Follow-up in 4 weeks for repeat evaluation and pelvis x-rays.  Follow-Up Instructions: Return in about 4 weeks (around 05/19/2022).   Orders:  No orders of the defined types were placed in this encounter.  No orders of the defined types were placed in this encounter.   Imaging: No new imaging  PMFS History: Patient Active Problem List   Diagnosis Date Noted   Status post total replacement of left hip 04/06/2022   Clavicle pain 02/13/2022   Acute sinusitis 02/13/2022   Primary osteoarthritis of left hip 11/21/2021   Orthostatic hypotension 09/22/2021   Dizziness 09/22/2021   Weight loss 09/22/2021   Status post total replacement of right hip 08/25/2021   Morbid obesity  ([place]) 11/14/2020   Sleep behavior disorder, 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   History of seizure 07/02/2020   Obesity with alveolar hypoventilation and body mass index (BMI) of 40 or greater ([place]) 07/02/2020   Severe obstructive sleep apnea-hypopnea syndrome 07/02/2020   OSA on CPAP 07/02/2020   Essential hypertension 05/07/2020   Chronic ankle pain 05/07/2020   Degenerative joint disease (DJD) of hip 05/07/2020   Seizure disorder ([place]) 05/07/2020   Hypothyroidism 05/07/2020   Mild persistent asthma 05/07/2020   Past Medical History:  Diagnosis Date   Arthritis    Asthma    COVID-19 07/23/2021   Depression    pt states she does not have depression. Cymbalta is used for pain control   GERD (gastroesophageal reflux disease)    Hypertension    Hypothyroid    Pre-diabetes    pt states she is no longer pre-diabetic due to weight loss   Sleep apnea    on CPAP   Wears hearing aid in right ear     Family History  Problem Relation Age of Onset   Hypertension Father    Pancreatic cancer Father    Diabetes Maternal Grandmother    Colon cancer Neg Hx    Stomach cancer Neg Hx    Esophageal cancer Neg Hx     Past Surgical History:  Procedure Laterality Date  ABDOMINAL HYSTERECTOMY     ANKLE RECONSTRUCTION     CHOLECYSTECTOMY     COLONOSCOPY  12/09/2018   [facility]ino [facility].  3 mm sessile polyp, hepatic flexure. Mild - Moderate diverticulosis, sigmoid colon.   CYSTOCELE REPAIR     ESOPHAGOGASTRODUODENOSCOPY  12/09/2018   Dr[facility]. Normal upper GI tract   RECTOCELE REPAIR     TENDON REPAIR     TOTAL ABDOMINAL HYSTERECTOMY     TOTAL HIP ARTHROPLASTY Right 08/25/2021   Procedure: RIGHT TOTAL HIP ARTHROPLASTY ANTERIOR APPROACH;  Surgeon: Leandrew Koyanagi, MD;  Location: [place];  Service: Orthopedics;  Laterality: Right;  3-C   TOTAL HIP ARTHROPLASTY Left 04/06/2022   Procedure: LEFT TOTAL HIP ARTHROPLASTY ANTERIOR APPROACH;  Surgeon:  Leandrew Koyanagi, MD;  Location: [place];  Service: Orthopedics;  Laterality: Left;   TUBAL LIGATION     Social History   Occupational History   Not on file  Tobacco Use   Smoking status: Never   Smokeless tobacco: Never  Vaping Use   Vaping Use: Never used  Substance and Sexual Activity   Alcohol use: Not Currently   Drug use: Never   Sexual activity: Yes    Partners: Male

## 2022-04-21 NOTE — Telephone Encounter (Signed)
Called and relayed verbal orders

## 2022-04-24 ENCOUNTER — Other Ambulatory Visit: Payer: Self-pay

## 2022-04-24 ENCOUNTER — Telehealth: Payer: Self-pay | Admitting: Orthopaedic Surgery

## 2022-04-24 DIAGNOSIS — Z96642 Presence of left artificial hip joint: Secondary | ICD-10-CM

## 2022-04-24 NOTE — Telephone Encounter (Signed)
Ordered

## 2022-04-24 NOTE — Telephone Encounter (Signed)
Ann from [employer] called. Patient would like a referral for PT sent to [facility]. Fax number is [phone]. Phone number [phone]

## 2022-04-27 ENCOUNTER — Telehealth: Payer: Self-pay | Admitting: Orthopaedic Surgery

## 2022-04-28 ENCOUNTER — Telehealth: Payer: Self-pay | Admitting: Orthopaedic Surgery

## 2022-04-28 ENCOUNTER — Encounter: Payer: Self-pay | Admitting: Orthopaedic Surgery

## 2022-04-29 ENCOUNTER — Encounter: Payer: Self-pay | Admitting: Orthopaedic Surgery

## 2022-04-29 ENCOUNTER — Ambulatory Visit (INDEPENDENT_AMBULATORY_CARE_PROVIDER_SITE_OTHER): Payer: Managed Care, Other (non HMO) | Admitting: Orthopaedic Surgery

## 2022-04-29 DIAGNOSIS — Z96642 Presence of left artificial hip joint: Secondary | ICD-10-CM

## 2022-04-29 NOTE — Progress Notes (Signed)
Post-Op Visit Note   Patient: Christy Todd           Date of Birth: February 16, 1961           MRN: [id number] Visit Date: 04/29/2022 PCP: Christy Nutting, DO   Assessment & Plan:  Chief Complaint:  Chief Complaint  Patient presents with   Left Hip - Follow-up    Let total hip arthroplasty 04/06/2022   Visit Diagnoses:  1. Status post total replacement of left hip     Plan: Christy Todd comes in today to have the thigh checked out.  Underwent left total hip on 04/06/2022.  Started walking more intensely this past week and started noticing some redness around the scar.  Denies any constitutional symptoms.  Denies any increased pain.  Examination of left hip shows a fully healed surgical scar.  There is some local irritation erythema but not consistent with cellulitis or surgical site infection.  No significant warmth.  No tenderness to palpation.  No fluctuance.  No induration.  I think the redness is related to recent increase in activity.  She will back off on this activity for a week and then can gradually increase as tolerated.  Signs symptoms of infection reviewed with the patient.  Follow-up as scheduled.  She will need standing AP pelvis x-rays at that time.  Follow-Up Instructions: No follow-ups on file.   Orders:  No orders of the defined types were placed in this encounter.  No orders of the defined types were placed in this encounter.   Imaging: No results found.  PMFS History: Patient Active Problem List   Diagnosis Date Noted   Status post total replacement of left hip 04/06/2022   Clavicle pain 02/13/2022   Acute sinusitis 02/13/2022   Primary osteoarthritis of left hip 11/21/2021   Orthostatic hypotension 09/22/2021   Dizziness 09/22/2021   Weight loss 09/22/2021   Status post total replacement of right hip 08/25/2021   Morbid obesity ([place]) 11/14/2020   Sleep behavior disorder, 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   History of seizure 07/02/2020   Obesity with alveolar hypoventilation  and body mass index (BMI) of 40 or greater ([place]) 07/02/2020   Severe obstructive sleep apnea-hypopnea syndrome 07/02/2020   OSA on CPAP 07/02/2020   Essential hypertension 05/07/2020   Chronic ankle pain 05/07/2020   Degenerative joint disease (DJD) of hip 05/07/2020   Seizure disorder ([place]) 05/07/2020   Hypothyroidism 05/07/2020   Mild persistent asthma 05/07/2020   Past Medical History:  Diagnosis Date   Arthritis    Asthma    COVID-19 07/23/2021   Depression    pt states she does not have depression. Cymbalta is used for pain control   GERD (gastroesophageal reflux disease)    Hypertension    Hypothyroid    Pre-diabetes    pt states she is no longer pre-diabetic due to weight loss   Sleep apnea    on CPAP   Wears hearing aid in right ear     Family History  Problem Relation Age of Onset   Hypertension Father    Pancreatic cancer Father    Diabetes Maternal Grandmother    Colon cancer Neg Hx    Stomach cancer Neg Hx    Esophageal cancer Neg Hx     Past Surgical History:  Procedure Laterality Date   ABDOMINAL HYSTERECTOMY     ANKLE RECONSTRUCTION     CHOLECYSTECTOMY     COLONOSCOPY  12/09/2018   [facility].  3  mm sessile polyp, hepatic flexure. Mild - Moderate diverticulosis, sigmoid colon.   CYSTOCELE REPAIR     ESOPHAGOGASTRODUODENOSCOPY  12/09/2018   Dr[facility]. Normal upper GI tract   RECTOCELE REPAIR     TENDON REPAIR     TOTAL ABDOMINAL HYSTERECTOMY     TOTAL HIP ARTHROPLASTY Right 08/25/2021   Procedure: RIGHT TOTAL HIP ARTHROPLASTY ANTERIOR APPROACH;  Surgeon: Leandrew Koyanagi, MD;  Location: [place];  Service: Orthopedics;  Laterality: Right;  3-C   TOTAL HIP ARTHROPLASTY Left 04/06/2022   Procedure: LEFT TOTAL HIP ARTHROPLASTY ANTERIOR APPROACH;  Surgeon: Leandrew Koyanagi, MD;  Location: [place];  Service: Orthopedics;  Laterality: Left;   TUBAL LIGATION     Social History   Occupational  History   Not on file  Tobacco Use   Smoking status: Never   Smokeless tobacco: Never  Vaping Use   Vaping Use: Never used  Substance and Sexual Activity   Alcohol use: Not Currently   Drug use: Never   Sexual activity: Yes    Partners: Male

## 2022-05-11 ENCOUNTER — Other Ambulatory Visit: Payer: Self-pay | Admitting: Physician Assistant

## 2022-05-11 NOTE — Progress Notes (Unsigned)
PATIENT: Christy Todd DOB: 1961/09/15  REASON FOR VISIT: follow up HISTORY FROM: patient PRIMARY NEUROLOGIST:   Virtual Visit via Video Note  I connected with Christy Todd on 05/12/22 at  2:15 PM EDT by a video enabled telemedicine application located remotely at [facility] and verified that I am speaking with the correct person using two identifiers who was located at their own home.   I discussed the limitations of evaluation and management by telemedicine and the availability of in person appointments. The patient expressed understanding and agreed to proceed.   PATIENT: Christy Todd DOB: 1961-01-04  REASON FOR VISIT: follow up HISTORY FROM: patient  HISTORY OF PRESENT ILLNESS: Today 05/12/22:  Christy Todd is a 61 year old female with a history of obstructive sleep apnea on CPAP.  She returns today for follow-up.  Her download is below.  She denies any new issues.  Reports that she is now down to 165 pounds.  Continues to find the CPAP beneficial.  Does not want to sleep without it.     REVIEW OF SYSTEMS: Out of a complete 14 system review of symptoms, the patient complains only of the following symptoms, and all other reviewed systems are negative.  ALLERGIES: Allergies  Allergen Reactions   Ciprofloxacin Other (See Comments)    Detatched muscles and tendons    Apple Juice     ALL form of apple  Chemical burn to mouth and gums   Gluten Meal Other (See Comments)    Gut Distress   Benzoin    Claritin [Loratadine] Other (See Comments)    allergic psychosis   Septra [Sulfamethoxazole-Trimethoprim] Rash    HOME MEDICATIONS: Outpatient Medications Prior to Visit  Medication Sig Dispense Refill   albuterol (VENTOLIN HFA) 108 (90 Base) MCG/ACT inhaler Inhale 2 puffs into the lungs every 6 (six) hours as needed for wheezing or shortness of breath. 8 g 0   aspirin EC 81 MG tablet Take 1 tablet (81 mg total) by mouth 2 (two) times daily. To  be taken after surgery to prevent blood clots 84 tablet 0   Cholecalciferol (VITAMIN D3) 50 MCG (2000 UT) TABS Take 2,000 Units by mouth daily.     docusate sodium (COLACE) 100 MG capsule Take 1 capsule (100 mg total) by mouth daily as needed. 30 capsule 2   DULoxetine (CYMBALTA) 30 MG capsule 2 BY MOUTH EVERY MORNING, 1 BY MOUTH EVERY AT NIGHT 270 capsule 2   levothyroxine (SYNTHROID) 125 MCG tablet TAKE 1 TABLET BY MOUTH DAILY BEFORE BREAKFAST. 90 tablet 1   lisinopril (ZESTRIL) 2.5 MG tablet TAKE 1 TABLET BY MOUTH EVERY DAY (Patient taking differently: Take 2.5 mg by mouth daily as needed (Take along with 20mg  tablet if SBP > 180).) 90 tablet 1   lisinopril (ZESTRIL) 20 MG tablet Take 20 mg by mouth daily.     Magnesium 200 MG TABS Take 200 mg by mouth daily.     methocarbamol (ROBAXIN-750) 750 MG tablet Take 1 tablet (750 mg total) by mouth 2 (two) times daily as needed for muscle spasms. 20 tablet 2   montelukast (SINGULAIR) 10 MG tablet TAKE 1 TABLET BY MOUTH EVERY DAY 90 tablet 1   NON FORMULARY Pt uses cpap nightly     omeprazole (PRILOSEC) 20 MG capsule Take 20 mg by mouth daily.     ondansetron (ZOFRAN) 4 MG tablet Take 1 tablet (4 mg total) by mouth every 8 (eight) hours as needed for nausea  or vomiting. 40 tablet 0   oxyCODONE-acetaminophen (PERCOCET) 5-325 MG tablet Take 1-2 tablets by mouth every 6 (six) hours as needed for severe pain. 40 tablet 0   spironolactone (ALDACTONE) 50 MG tablet TAKE 1 TABLET BY MOUTH EVERY DAY 90 tablet 1   WEGOVY 2.4 MG/0.75ML SOAJ Inject 2.4 mg into the skin once a week. 9 mL 3   No facility-administered medications prior to visit.    PAST MEDICAL HISTORY: Past Medical History:  Diagnosis Date   Arthritis    Asthma    COVID-19 07/23/2021   Depression    pt states she does not have depression. Cymbalta is used for pain control   GERD (gastroesophageal reflux disease)    Hypertension    Hypothyroid    Pre-diabetes    pt states she is no  longer pre-diabetic due to weight loss   Sleep apnea    on CPAP   Wears hearing aid in right ear     PAST SURGICAL HISTORY: Past Surgical History:  Procedure Laterality Date   ABDOMINAL HYSTERECTOMY     ANKLE RECONSTRUCTION     CHOLECYSTECTOMY     COLONOSCOPY  12/09/2018   [facility]. [facility].  3 mm sessile polyp, hepatic flexure. Mild - Moderate diverticulosis, sigmoid colon.   CYSTOCELE REPAIR     ESOPHAGOGASTRODUODENOSCOPY  12/09/2018   Dr. [facility]. Normal upper GI tract   RECTOCELE REPAIR     TENDON REPAIR     TOTAL ABDOMINAL HYSTERECTOMY     TOTAL HIP ARTHROPLASTY Right 08/25/2021   Procedure: RIGHT TOTAL HIP ARTHROPLASTY ANTERIOR APPROACH;  Surgeon: Tarry Kos, MD;  [facility];  Service: Orthopedics;  Laterality: Right;  3-C   TOTAL HIP ARTHROPLASTY Left 04/06/2022   Procedure: LEFT TOTAL HIP ARTHROPLASTY ANTERIOR APPROACH;  Surgeon: Tarry Kos, MD;  Location: [facility];  Service: Orthopedics;  Laterality: Left;   TUBAL LIGATION      FAMILY HISTORY: Family History  Problem Relation Age of Onset   Hypertension Father    Pancreatic cancer Father    Diabetes Maternal Grandmother    Colon cancer Neg Hx    Stomach cancer Neg Hx    Esophageal cancer Neg Hx     SOCIAL HISTORY: Social History   Socioeconomic History   Marital status: Married    Spouse name: Ymani Thaemert   Number of children: 2   Years of education: Not on file   Highest education level: Not on file  Occupational History   Not on file  Tobacco Use   Smoking status: Never   Smokeless tobacco: Never  Vaping Use   Vaping Use: Never used  Substance and Sexual Activity   Alcohol use: Not Currently   Drug use: Never   Sexual activity: Yes    Partners: Male  Other Topics Concern   Not on file  Social History Narrative   Not on file   Social Determinants of Health   Financial Resource Strain: Not on file  Food Insecurity:  Not on file  Transportation Needs: Not on file  Physical Activity: Not on file  Stress: Not on file  Social Connections: Not on file  Intimate Partner Violence: Not on file      PHYSICAL EXAM Generalized: Well developed, in no acute distress   Neurological examination  Mentation: Alert oriented to time, place, history taking. Follows all commands speech and language fluent Cranial nerve II-XII:Extraocular movements were full. Facial symmetry noted.  uvula tongue midline. Head turning and shoulder shrug  were normal and symmetric.   DIAGNOSTIC DATA (LABS, IMAGING, TESTING) - I reviewed patient records, labs, notes, testing and imaging myself where available.  Lab Results  Component Value Date   WBC 6.8 04/07/2022   HGB 11.9 (L) 04/07/2022   HCT 37.6 04/07/2022   MCV 88.9 04/07/2022   PLT 229 04/07/2022      Component Value Date/Time   NA 141 03/26/2022 1033   K 4.9 03/26/2022 1033   CL 106 03/26/2022 1033   CO2 30 03/26/2022 1033   GLUCOSE 98 03/26/2022 1033   BUN 20 03/26/2022 1033   CREATININE 0.90 03/26/2022 1033   CREATININE 0.86 10/01/2021 1023   CALCIUM 9.5 03/26/2022 1033   PROT 6.5 03/26/2022 1033   ALBUMIN 3.6 03/26/2022 1033   AST 17 03/26/2022 1033   ALT 18 03/26/2022 1033   ALKPHOS 75 03/26/2022 1033   BILITOT 0.7 03/26/2022 1033   GFRNONAA >60 03/26/2022 1033   Lab Results  Component Value Date   CHOL 175 10/01/2021   HDL 52 10/01/2021   LDLCALC 104 (H) 10/01/2021   TRIG 95 10/01/2021   CHOLHDL 3.4 10/01/2021   No results found for: "HGBA1C" No results found for: "VITAMINB12" Lab Results  Component Value Date   TSH 0.58 02/13/2022      ASSESSMENT AND PLAN 61 y.o. year old female  has a past medical history of Arthritis, Asthma, COVID-19 (07/23/2021), Depression, GERD (gastroesophageal reflux disease), Hypertension, Hypothyroid, Pre-diabetes, Sleep apnea, and Wears hearing aid in right ear. here with:  OSA on CPAP  CPAP compliance  excellent Residual AHI is good Encouraged patient to continue using CPAP nightly and > 4 hours each night Discussed repeating home sleep test due to weight loss however she deferred for now F/U in 1 year or sooner if needed   Butch Penny, MSN, NP-C 05/12/2022, 1:55 PM [facility] [address], [address] [place], [place] [address] [phone]

## 2022-05-12 ENCOUNTER — Telehealth (INDEPENDENT_AMBULATORY_CARE_PROVIDER_SITE_OTHER): Payer: Managed Care, Other (non HMO) | Admitting: Adult Health

## 2022-05-12 DIAGNOSIS — G4733 Obstructive sleep apnea (adult) (pediatric): Secondary | ICD-10-CM

## 2022-05-12 DIAGNOSIS — Z9989 Dependence on other enabling machines and devices: Secondary | ICD-10-CM

## 2022-05-15 ENCOUNTER — Ambulatory Visit (INDEPENDENT_AMBULATORY_CARE_PROVIDER_SITE_OTHER): Payer: Managed Care, Other (non HMO) | Admitting: Physician Assistant

## 2022-05-15 ENCOUNTER — Ambulatory Visit (INDEPENDENT_AMBULATORY_CARE_PROVIDER_SITE_OTHER): Payer: Managed Care, Other (non HMO)

## 2022-05-15 DIAGNOSIS — Z96642 Presence of left artificial hip joint: Secondary | ICD-10-CM

## 2022-05-15 MED ORDER — METHOCARBAMOL 500 MG PO TABS: 500.0000 mg | ORAL_TABLET | Freq: Two times a day (BID) | ORAL | 2 refills | Status: DC | PRN

## 2022-05-15 NOTE — Progress Notes (Signed)
Christy Todd

## 2022-05-15 NOTE — Progress Notes (Signed)
Post-Op Visit Note   Patient: Christy Todd           Date of Birth: 1961/02/12           MRN: [id number] Visit Date: 05/15/2022 PCP: Luetta Nutting, DO   Assessment & Plan:  Chief Complaint:  Chief Complaint  Patient presents with   Left Hip - Pain   Visit Diagnoses:  1. Status post total replacement of left hip     Plan: Patient is a pleasant 61 year old female who comes in today 6 weeks status post left total hip replacement 04/06/2022.  She has been doing well.  She has been in outpatient physical therapy making good progress.  She has been compliant taking her baby aspirin twice daily for DVT prophylaxis.  Unfortunately, she has been getting charley horses to both calves.  She is taking Robaxin as needed.  Examination of her left hip reveals a fully healed surgical scar without complication.  Painless hip flexion and logroll.  She is neurovascular intact distally.  At this point, she would like to continue with physical therapy to continue working on strengthening and endurance.  She will also continue with her home exercise program.  She may discontinue the aspirin for DVT prophylaxis.  She will follow-up with [place] in 6 weeks time for repeat evaluation.  I refilled her Robaxin.  Call with concerns or questions.  Follow-Up Instructions: Return in about 6 weeks (around 06/26/2022).   Orders:  Orders Placed This Encounter  Procedures   XR Pelvis 1-2 Views   Meds ordered this encounter  Medications   methocarbamol (ROBAXIN) 500 MG tablet    Sig: Take 1 tablet (500 mg total) by mouth 2 (two) times daily as needed for muscle spasms.    Dispense:  40 tablet    Refill:  2    Imaging: XR Pelvis 1-2 Views  Result Date: 05/15/2022 X-rays reveal a well-seated prosthesis without complication   PMFS History: Patient Active Problem List   Diagnosis Date Noted   Status post total replacement of left hip 04/06/2022   Clavicle pain 02/13/2022   Acute sinusitis 02/13/2022   Primary  osteoarthritis of left hip 11/21/2021   Orthostatic hypotension 09/22/2021   Dizziness 09/22/2021   Weight loss 09/22/2021   Status post total replacement of right hip 08/25/2021   Morbid obesity ([place]) 11/14/2020   Sleep behavior disorder, 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   History of seizure 07/02/2020   Obesity with alveolar hypoventilation and body mass index (BMI) of 40 or greater ([place]) 07/02/2020   Severe obstructive sleep apnea-hypopnea syndrome 07/02/2020   OSA on CPAP 07/02/2020   Essential hypertension 05/07/2020   Chronic ankle pain 05/07/2020   Degenerative joint disease (DJD) of hip 05/07/2020   Seizure disorder ([place]) 05/07/2020   Hypothyroidism 05/07/2020   Mild persistent asthma 05/07/2020   Past Medical History:  Diagnosis Date   Arthritis    Asthma    COVID-19 07/23/2021   Depression    pt states she does not have depression. Cymbalta is used for pain control   GERD (gastroesophageal reflux disease)    Hypertension    Hypothyroid    Pre-diabetes    pt states she is no longer pre-diabetic due to weight loss   Sleep apnea    on CPAP   Wears hearing aid in right ear     Family History  Problem Relation Age of Onset   Hypertension Father    Pancreatic cancer Father    Diabetes Maternal Grandmother  Colon cancer Neg Hx    Stomach cancer Neg Hx    Esophageal cancer Neg Hx     Past Surgical History:  Procedure Laterality Date   ABDOMINAL HYSTERECTOMY     ANKLE RECONSTRUCTION     CHOLECYSTECTOMY     COLONOSCOPY  12/09/2018   [facility].  3 mm sessile polyp, hepatic flexure. Mild - Moderate diverticulosis, sigmoid colon.   CYSTOCELE REPAIR     ESOPHAGOGASTRODUODENOSCOPY  12/09/2018   [facility]. Normal upper GI tract   RECTOCELE REPAIR     TENDON REPAIR     TOTAL ABDOMINAL HYSTERECTOMY     TOTAL HIP ARTHROPLASTY Right 08/25/2021   Procedure: RIGHT TOTAL HIP ARTHROPLASTY ANTERIOR APPROACH;   Surgeon: Leandrew Koyanagi, MD;  Location: [place];  Service: Orthopedics;  Laterality: Right;  3-C   TOTAL HIP ARTHROPLASTY Left 04/06/2022   Procedure: LEFT TOTAL HIP ARTHROPLASTY ANTERIOR APPROACH;  Surgeon: Leandrew Koyanagi, MD;  Location: [place];  Service: Orthopedics;  Laterality: Left;   TUBAL LIGATION     Social History   Occupational History   Not on file  Tobacco Use   Smoking status: Never   Smokeless tobacco: Never  Vaping Use   Vaping Use: Never used  Substance and Sexual Activity   Alcohol use: Not Currently   Drug use: Never   Sexual activity: Yes    Partners: Male

## 2022-05-29 ENCOUNTER — Other Ambulatory Visit: Payer: Self-pay | Admitting: Family Medicine

## 2022-06-10 ENCOUNTER — Other Ambulatory Visit: Payer: Self-pay | Admitting: Family Medicine

## 2022-06-22 ENCOUNTER — Encounter: Payer: Self-pay | Admitting: Orthopaedic Surgery

## 2022-06-26 ENCOUNTER — Encounter: Payer: Managed Care, Other (non HMO) | Admitting: Orthopaedic Surgery

## 2022-06-26 ENCOUNTER — Telehealth: Payer: Self-pay | Admitting: General Practice

## 2022-06-26 NOTE — Telephone Encounter (Signed)
Transition Care Management Follow-up Telephone Call Date of discharge and from where: 06/26/22 from [facility] How have you been since you were released from the hospital? Patient is doing ok. She was at the cardiologist office when I called. She will call back to schedule a follow up with Dr. Zigmund Daniel. Any questions or concerns? No  Items Reviewed: Did the pt receive and understand the discharge instructions provided? Yes  Medications obtained and verified? No  Other? No  Any new allergies since your discharge? No  Dietary orders reviewed? Yes Do you have support at home? Yes   Home Care and Equipment/Supplies: Were home health services ordered? no  Functional Questionnaire: (I = Independent and D = Dependent) ADLs: I  Bathing/Dressing- I  Meal Prep- I  Eating- I  Maintaining continence- I  Transferring/Ambulation- I  Managing Meds- I  Follow up appointments reviewed:  PCP Hospital f/u appt confirmed? No  Patient will call back to schedule an appointment. [facility] f/u appt confirmed? Yes  Scheduled to see the cardioligist on 06/26/22. Are transportation arrangements needed? No  If their condition worsens, is the pt aware to call PCP or go to the Emergency Dept.? Yes Was the patient provided with contact information for the PCP's office or ED? Yes Was to pt encouraged to call back with questions or concerns? Yes

## 2022-06-28 ENCOUNTER — Encounter (INDEPENDENT_AMBULATORY_CARE_PROVIDER_SITE_OTHER): Payer: Self-pay

## 2022-06-29 ENCOUNTER — Other Ambulatory Visit: Payer: Self-pay | Admitting: Family Medicine

## 2022-07-01 ENCOUNTER — Other Ambulatory Visit: Payer: Self-pay

## 2022-07-01 ENCOUNTER — Ambulatory Visit (INDEPENDENT_AMBULATORY_CARE_PROVIDER_SITE_OTHER): Payer: Managed Care, Other (non HMO) | Admitting: Family Medicine

## 2022-07-01 ENCOUNTER — Encounter: Payer: Self-pay | Admitting: Family Medicine

## 2022-07-01 DIAGNOSIS — I10 Essential (primary) hypertension: Secondary | ICD-10-CM

## 2022-07-01 DIAGNOSIS — R079 Chest pain, unspecified: Secondary | ICD-10-CM

## 2022-07-01 DIAGNOSIS — R634 Abnormal weight loss: Secondary | ICD-10-CM

## 2022-07-01 MED ORDER — LISINOPRIL 10 MG PO TABS: 10.0000 mg | ORAL_TABLET | Freq: Every day | ORAL | 3 refills | Status: DC

## 2022-07-01 MED ORDER — WEGOVY 1.7 MG/0.75ML ~~LOC~~ SOAJ: 1.7000 mg | SUBCUTANEOUS | 2 refills | Status: DC

## 2022-07-01 NOTE — Progress Notes (Signed)
CANYON WILLOW - 61 y.o. female MRN [id number]  Date of birth: Dec 02, 1960  Subjective Chief Complaint  Patient presents with   Hospitalization Follow-up    HPI Christy Todd is a 61 year old female here today for hospital follow-up.  Recently seen in the ED due to chest pain.  She reports that she was awakened in the middle the night with chest pain that felt like an elephant sitting on her chest.  Had shortness of breath with this.  In the ER she did not have any ST elevations.  Cardiac enzymes were negative.  She did have EKG changes that were consistent with partial left bundle branch block.  She has had follow-up with cardiology and they are planning to do a [place].  She has not had any further episodes of chest pain.  Denies dyspnea or fatigue.  She has done incredibly well with [facility] for management of her weight.  She does continue to lose weight and feels that she has reached her goal weight.  She would like to try backing down on Wegovy a little bit.  Blood pressure has been a little low and she has been lightheaded at times.  She would like to reduce lisinopril.  ROS:  A comprehensive ROS was completed and negative except as noted per HPI  Allergies  Allergen Reactions   Ciprofloxacin Other (See Comments)    Detatched muscles and tendons    Apple Juice     ALL form of apple  Chemical burn to mouth and gums   Gluten Meal Other (See Comments)    Gut Distress   Benzoin    Claritin [Loratadine] Other (See Comments)    allergic psychosis   Septra [Sulfamethoxazole-Trimethoprim] Rash    Past Medical History:  Diagnosis Date   Arthritis    Asthma    COVID-19 07/23/2021   Depression    pt states she does not have depression. Cymbalta is used for pain control   GERD (gastroesophageal reflux disease)    Hypertension    Hypothyroid    Pre-diabetes    pt states she is no longer pre-diabetic due to weight loss   Sleep apnea    on CPAP   Wears hearing aid in right ear     Past  Surgical History:  Procedure Laterality Date   ABDOMINAL HYSTERECTOMY     ANKLE RECONSTRUCTION     CHOLECYSTECTOMY     COLONOSCOPY  12/09/2018   [facility].  3 mm sessile polyp, hepatic flexure. Mild - Moderate diverticulosis, sigmoid colon.   CYSTOCELE REPAIR     ESOPHAGOGASTRODUODENOSCOPY  12/09/2018   [facility]. Normal upper GI tract   RECTOCELE REPAIR     TENDON REPAIR     TOTAL ABDOMINAL HYSTERECTOMY     TOTAL HIP ARTHROPLASTY Right 08/25/2021   Procedure: RIGHT TOTAL HIP ARTHROPLASTY ANTERIOR APPROACH;  Surgeon: Leandrew Koyanagi, MD;  Location: [place];  Service: Orthopedics;  Laterality: Right;  3-C   TOTAL HIP ARTHROPLASTY Left 04/06/2022   Procedure: LEFT TOTAL HIP ARTHROPLASTY ANTERIOR APPROACH;  Surgeon: Leandrew Koyanagi, MD;  Location: [place];  Service: Orthopedics;  Laterality: Left;   TUBAL LIGATION      Social History   Socioeconomic History   Marital status: Married    Spouse name: Edye Hainline   Number of children: 2   Years of education: Not on file   Highest education level: Not on file  Occupational History   Not  on file  Tobacco Use   Smoking status: Never   Smokeless tobacco: Never  Vaping Use   Vaping Use: Never used  Substance and Sexual Activity   Alcohol use: Not Currently   Drug use: Never   Sexual activity: Yes    Partners: Male  Other Topics Concern   Not on file  Social History Narrative   Not on file   Social Determinants of Health   Financial Resource Strain: Not on file  Food Insecurity: Not on file  Transportation Needs: Not on file  Physical Activity: Not on file  Stress: Not on file  Social Connections: Not on file    Family History  Problem Relation Age of Onset   Hypertension Father    Pancreatic cancer Father    Diabetes Maternal Grandmother    Colon cancer Neg Hx    Stomach cancer Neg Hx    Esophageal cancer Neg Hx     Health Maintenance  Topic  Date Due   COVID-19 Vaccine (5 - Pfizer series) 12/15/2021   MAMMOGRAM  09/29/2022 (Originally 07/10/2012)   INFLUENZA VACCINE  01/31/2023 (Originally 06/02/2022)   Hepatitis C Screening  02/14/2023 (Originally 07/10/1980)   TETANUS/TDAP  08/27/2030   COLONOSCOPY (Pts 45-54yr Insurance coverage will need to be confirmed)  07/05/2031   HIV Screening  Completed   Zoster Vaccines- Shingrix  Completed   HPV VACCINES  Aged Out   PAP SMEAR-Modifier  Discontinued     ----------------------------------------------------------------------------------------------------------------------------------------------------------------------------------------------------------------- Physical Exam BP 91/63 (BP Location: Left Arm, Patient Position: Sitting, Cuff Size: Large)   Pulse 73   Ht '5\' 6"'$  (1.676 m)   Wt 169 lb (76.7 kg)   LMP 11/02/1993   SpO2 98%   BMI 27.28 kg/m   Physical Exam Constitutional:      Appearance: Normal appearance.  Eyes:     General: No scleral icterus. Cardiovascular:     Rate and Rhythm: Normal rate and regular rhythm.  Pulmonary:     Effort: Pulmonary effort is normal.     Breath sounds: Normal breath sounds.  Musculoskeletal:     Cervical back: Neck supple.  Neurological:     Mental Status: She is alert.  Psychiatric:        Mood and Affect: Mood normal.        Behavior: Behavior normal.     ------------------------------------------------------------------------------------------------------------------------------------------------------------------------------------------------------------------- Assessment and Plan  Essential hypertension Blood pressure is low at this time.  Will decrease lisinopril to 10 mg daily.  She will continue to monitor blood pressure at home.  Morbid obesity ([place] She has had significant weight loss of Wegovy.  Doing well with this however feels like she has reached her goal weight.  We will decrease to 1.7 mg.  Chest  pain Recent chest pain concerning for anginal symptoms.  Negative cardiac enzymes at recent ED visit.  [place] [facility]   Meds ordered this encounter  Medications   Semaglutide-Weight Management (WEGOVY) 1.7 MG/0.75ML SOAJ    Sig: Inject 1.7 mg into the skin once a week.    Dispense:  3 mL    Refill:  2   lisinopril (ZESTRIL) 10 MG tablet    Sig: Take 1 tablet (10 mg total) by mouth daily.    Dispense:  90 tablet    Refill:  3    Return in about 3 months (around 10/01/2022) for Weight mgmt/BP.    This visit occurred during the SARS-CoV-2 public health emergency.  Safety protocols were in place, including  screening questions prior to the visit, additional usage of staff PPE, and extensive cleaning of exam room while observing appropriate contact time as indicated for disinfecting solutions.

## 2022-07-01 NOTE — Patient Instructions (Signed)
Reduce wegovy to 1.'7mg'$  weekly.  Reduce lisinopril to '10mg'$  daily.  See me again in 3 months.

## 2022-07-03 ENCOUNTER — Ambulatory Visit (INDEPENDENT_AMBULATORY_CARE_PROVIDER_SITE_OTHER): Payer: Managed Care, Other (non HMO) | Admitting: Orthopaedic Surgery

## 2022-07-03 ENCOUNTER — Encounter: Payer: Self-pay | Admitting: Orthopaedic Surgery

## 2022-07-03 ENCOUNTER — Ambulatory Visit (INDEPENDENT_AMBULATORY_CARE_PROVIDER_SITE_OTHER): Payer: Managed Care, Other (non HMO)

## 2022-07-03 DIAGNOSIS — Z96642 Presence of left artificial hip joint: Secondary | ICD-10-CM

## 2022-07-03 NOTE — Progress Notes (Signed)
Post-Op Visit Note   Patient: Christy Todd           Date of Birth: 1961-04-06           MRN: [id number] Visit Date: 07/03/2022 PCP: Luetta Nutting, DO   Assessment & Plan:  Chief Complaint:  Chief Complaint  Patient presents with   Left Hip - Routine Post Op   Visit Diagnoses:  1. Status post total replacement of left hip     Plan: Prudie is 13 weeks status post left total hip on 04/06/2022.  Overall doing well.  Has had 3 [place] episodes of deep left thigh pain that is not tender to palpation or worse with weightbearing or walking or activity.  Advil helps the pain completely.  Unfortunately she had an MRI last week.  Examination of the left hip shows a fully healed surgical scar.  There is no swelling or skin lesions or masses.  She has no tenderness to palpation anywhere.  I watched her walk down the hall and she had a normal gait.  She has no reproducible pain with hip range of motion or circumduction.  The implant looks stable and without complication.  Chudney is doing well overall.  Not exactly sure why she has had those episodes of thigh pain.  Do not see any problems with the implant.  We can send her to physical therapy for dry needling to see if this will help with.  Recheck in 3 months with standing AP pelvis x-rays.  Follow-Up Instructions: Return in about 3 months (around 10/02/2022).   Orders:  Orders Placed This Encounter  Procedures   XR HIP UNILAT W OR W/O PELVIS 2-3 VIEWS LEFT   No orders of the defined types were placed in this encounter.   Imaging: XR HIP UNILAT W OR W/O PELVIS 2-3 VIEWS LEFT  Result Date: 07/03/2022 Stable total hip replacement without complication   PMFS History: Patient Active Problem List   Diagnosis Date Noted   Status post total replacement of left hip 04/06/2022   Clavicle pain 02/13/2022   Acute sinusitis 02/13/2022   Primary osteoarthritis of left hip 11/21/2021   Orthostatic hypotension 09/22/2021   Dizziness 09/22/2021    Weight loss 09/22/2021   Status post total replacement of right hip 08/25/2021   Morbid obesity ([place]) 11/14/2020   Sleep behavior disorder, 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   History of seizure 07/02/2020   Obesity with alveolar hypoventilation and body mass index (BMI) of 40 or greater ([place]) 07/02/2020   Severe obstructive sleep apnea-hypopnea syndrome 07/02/2020   OSA on CPAP 07/02/2020   Essential hypertension 05/07/2020   Chronic ankle pain 05/07/2020   Degenerative joint disease (DJD) of hip 05/07/2020   Seizure disorder ([place]) 05/07/2020   Hypothyroidism 05/07/2020   Mild persistent asthma 05/07/2020   Past Medical History:  Diagnosis Date   Arthritis    Asthma    COVID-19 07/23/2021   Depression    pt states she does not have depression. Cymbalta is used for pain control   GERD (gastroesophageal reflux disease)    Hypertension    Hypothyroid    Pre-diabetes    pt states she is no longer pre-diabetic due to weight loss   Sleep apnea    on CPAP   Wears hearing aid in right ear     Family History  Problem Relation Age of Onset   Hypertension Father    Pancreatic cancer Father    Diabetes Maternal Grandmother    Colon cancer  Neg Hx    Stomach cancer Neg Hx    Esophageal cancer Neg Hx     Past Surgical History:  Procedure Laterality Date   ABDOMINAL HYSTERECTOMY     ANKLE RECONSTRUCTION     CHOLECYSTECTOMY     COLONOSCOPY  12/09/2018   [facility].  3 mm sessile polyp, hepatic flexure. Mild - Moderate diverticulosis, sigmoid colon.   CYSTOCELE REPAIR     ESOPHAGOGASTRODUODENOSCOPY  12/09/2018   [facility]. Normal upper GI tract   RECTOCELE REPAIR     TENDON REPAIR     TOTAL ABDOMINAL HYSTERECTOMY     TOTAL HIP ARTHROPLASTY Right 08/25/2021   Procedure: RIGHT TOTAL HIP ARTHROPLASTY ANTERIOR APPROACH;  Surgeon: Leandrew Koyanagi, MD;  Location: [place];  Service: Orthopedics;  Laterality: Right;  3-C   TOTAL HIP  ARTHROPLASTY Left 04/06/2022   Procedure: LEFT TOTAL HIP ARTHROPLASTY ANTERIOR APPROACH;  Surgeon: Leandrew Koyanagi, MD;  Location: [place];  Service: Orthopedics;  Laterality: Left;   TUBAL LIGATION     Social History   Occupational History   Not on file  Tobacco Use   Smoking status: Never   Smokeless tobacco: Never  Vaping Use   Vaping Use: Never used  Substance and Sexual Activity   Alcohol use: Not Currently   Drug use: Never   Sexual activity: Yes    Partners: Male

## 2022-07-06 ENCOUNTER — Encounter: Payer: Self-pay | Admitting: Family Medicine

## 2022-07-06 DIAGNOSIS — R079 Chest pain, unspecified: Secondary | ICD-10-CM | POA: Insufficient documentation

## 2022-07-06 NOTE — Assessment & Plan Note (Signed)
Blood pressure is low at this time.  Will decrease lisinopril to 10 mg daily.  She will continue to monitor blood pressure at home.

## 2022-07-06 NOTE — Assessment & Plan Note (Signed)
She has had significant weight loss of Wegovy.  Doing well with this however feels like she has reached her goal weight.  We will decrease to 1.7 mg.

## 2022-07-06 NOTE — Assessment & Plan Note (Signed)
Recent chest pain concerning for anginal symptoms.  Negative cardiac enzymes at recent ED visit.  Has upcoming [place] with cardiology.

## 2022-07-13 ENCOUNTER — Encounter: Payer: Self-pay | Admitting: Family Medicine

## 2022-07-13 DIAGNOSIS — R079 Chest pain, unspecified: Secondary | ICD-10-CM

## 2022-07-14 NOTE — Telephone Encounter (Signed)
Referral signed.

## 2022-07-26 ENCOUNTER — Other Ambulatory Visit: Payer: Self-pay | Admitting: Family Medicine

## 2022-07-26 DIAGNOSIS — I10 Essential (primary) hypertension: Secondary | ICD-10-CM

## 2022-07-29 ENCOUNTER — Encounter: Payer: Self-pay | Admitting: Family Medicine

## 2022-07-31 ENCOUNTER — Telehealth: Payer: Self-pay

## 2022-07-31 NOTE — Telephone Encounter (Signed)
Initiated Prior authorization [id number] 1.'7MG'$ /0.75ML auto-injectors Via: Covermymeds Case/Key:[id number] Status: approved  as of 07/31/22 Reason:no pa was needed pt has a pa on file ,plan limits only a 30 day supply at a time not a 90 which why it rejected,pt was called in regards to the update  Notified Pt via: Mychart called and spoke to pt

## 2022-08-04 ENCOUNTER — Ambulatory Visit: Payer: Managed Care, Other (non HMO) | Attending: Internal Medicine | Admitting: Internal Medicine

## 2022-08-04 VITALS — BP 118/64 | HR 61 | Ht 66.0 in | Wt 167.0 lb

## 2022-08-04 DIAGNOSIS — R079 Chest pain, unspecified: Secondary | ICD-10-CM

## 2022-08-04 MED ORDER — METOPROLOL TARTRATE 50 MG PO TABS: ORAL_TABLET | ORAL | 0 refills | Status: DC

## 2022-08-04 NOTE — Patient Instructions (Addendum)
Medication Instructions:   *If you need a refill on your cardiac medications before your next appointment, please call your pharmacy*   Lab Work: NMR, APO B, LIPO A AND BMET, HGBA1C  TODAY  If you have labs (blood work) drawn today and your tests are completely normal, you will receive your results only by: [place] (if you have MyChart) OR A paper copy in the mail If you have any lab test that is abnormal or we need to change your treatment, we will call you to review the results.   Testing/Procedures:   Your cardiac CT will be scheduled at one of the below locations:   [facility] [address] [place], [place] [address] (336) [place] [address] [place], [place] [address] [phone]  [facility] [place], [place] [address] [phone]  If scheduled at [facility], please arrive at the [facility] and Children's Entrance (Entrance C2) of [facility] 30 minutes prior to test start time. You can use the FREE valet parking offered at entrance C (encouraged to control the heart rate for the test)  Proceed to the [facility] Radiology Department (first floor) to check-in and test prep.  All radiology patients and guests should use entrance C2 at [facility], accessed from [facility], even though the hospital's physical address listed is [address].    If scheduled at [facility] or [facility], please arrive 15 mins early for check-in and test prep.   Please follow these instructions carefully (unless otherwise directed):  On the Night Before the Test: Be sure to Drink plenty of water. Do not consume any caffeinated/decaffeinated beverages or chocolate 12 hours prior to your test. Do not take any antihistamines 12 hours prior to your test.  On  the Day of the Test: Drink plenty of water until 1 hour prior to the test. Do not eat any food 1 hour prior to test. You may take your regular medications prior to the test.  Take metoprolol (Lopressor) two hours prior to test. HOLD FLUID PILLS morning of the test. FEMALES- please wear underwire-free bra if available, avoid dresses & tight clothing        After the Test: Drink plenty of water. After receiving IV contrast, you may experience a mild flushed feeling. This is normal. On occasion, you may experience a mild rash up to 24 hours after the test. This is not dangerous. If this occurs, you can take Benadryl 25 mg and increase your fluid intake. If you experience trouble breathing, this can be serious. If it is severe call 911 IMMEDIATELY. If it is mild, please call our office. If you take any of these medications: Glipizide/Metformin, Avandament, Glucavance, please do not take 48 hours after completing test unless otherwise instructed.  We will call to schedule your test 2-4 weeks out understanding that some insurance companies will need an authorization prior to the service being performed.   For non-scheduling related questions, please contact the cardiac imaging nurse navigator should you have any questions/concerns: Marchia Bond, Cardiac Imaging Nurse Navigator Gordy Clement, Cardiac Imaging Nurse Navigator [facility] Direct Office Dial: [phone]   For scheduling needs, including cancellations and rescheduling, please call [place], [phone].    Follow-Up: At [facility], you and your health needs are our priority.  As part  of our continuing mission to provide you with exceptional heart care, we have created designated Provider Care Teams.  These Care Teams include your primary Cardiologist (physician) and Advanced Practice Providers (APPs -  Physician Assistants and Nurse Practitioners) who all work together to provide you with  the care you need, when you need it.  We recommend signing up for the patient portal called "MyChart".  Sign up information is provided on this After Visit Summary.  MyChart is used to connect with patients for Virtual Visits (Telemedicine).  Patients are able to view lab/test results, encounter notes, upcoming appointments, etc.  Non-urgent messages can be sent to your provider as well.   To learn more about what you can do with MyChart, go to [email or web address].    Other Instructions   Important Information About Sugar

## 2022-08-04 NOTE — Progress Notes (Signed)
Cardiology Office Note   Date:  08/04/2022   ID:  Christy Todd, DOB 01-Nov-1961, MRN [id number]  PCP:  Luetta Nutting, DO  Cardiologist:   Dorris Carnes, MD   Pt presents for evaluation of CP and back pain      History of Present Illness: Christy Todd is a 61 y.o. female with a history of chest pain.  On 06/26/22 the pt says she woke up and felt like an elephant was sitting on her chest    Denied reflux    Had been feeling good the day prior      She went to [facility] ER    Pain eased off by the time she left    Trop negatvie    EKG showed LBBB new    The pt  had follow up in cardiology   Stress test done   Normal Wore monitor   SR with rare PAC, PVC   Longest burst SVT 15 beats     Since ER visit she has had no further chest pain   Does say that she has lost 140 lb with [facility]   She walks every day   3.5 miles now in 30 min  Pushes stroller.     She will get back pain  Started in June   Will occur when walking hills Yawns a lot when episode occurs Will slow down and symtpoms ease   Feels like she ant get a big breath   This is new   She had not had in the past        Current Meds  Medication Sig   albuterol (VENTOLIN HFA) 108 (90 Base) MCG/ACT inhaler Inhale 2 puffs into the lungs every 6 (six) hours as needed for wheezing or shortness of breath.   aspirin EC 81 MG tablet Take 1 tablet (81 mg total) by mouth 2 (two) times daily. To be taken after surgery to prevent blood clots (Patient taking differently: Take 81 mg by mouth daily. To be taken after surgery to prevent blood clots)   Cholecalciferol (VITAMIN D3) 50 MCG (2000 UT) TABS Take 2,000 Units by mouth daily.   levothyroxine (SYNTHROID) 125 MCG tablet TAKE 1 TABLET BY MOUTH EVERY DAY BEFORE BREAKFAST   lisinopril (ZESTRIL) 10 MG tablet Take 1 tablet (10 mg total) by mouth daily.   Magnesium 200 MG TABS Take 200 mg by mouth daily.   montelukast (SINGULAIR) 10 MG tablet TAKE 1 TABLET BY MOUTH EVERY DAY   NON FORMULARY Pt uses  cpap nightly   omeprazole (PRILOSEC) 20 MG capsule Take 20 mg by mouth daily.   Semaglutide-Weight Management (WEGOVY) 1.7 MG/0.75ML SOAJ Inject 1.7 mg into the skin once a week.   spironolactone (ALDACTONE) 50 MG tablet TAKE 1 TABLET BY MOUTH EVERY DAY     Allergies:   Ciprofloxacin, Apple juice, Gluten meal, Benzoin, Claritin [loratadine], and Septra [sulfamethoxazole-trimethoprim]   Past Medical History:  Diagnosis Date   Arthritis    Asthma    COVID-19 07/23/2021   Depression    pt states she does not have depression. Cymbalta is used for pain control   GERD (gastroesophageal reflux disease)    Hypertension    Hypothyroid    Pre-diabetes    pt states she is no longer pre-diabetic due to weight loss   Sleep apnea    on CPAP   Wears hearing aid in right ear     Past Surgical History:  Procedure Laterality Date  ABDOMINAL HYSTERECTOMY     ANKLE RECONSTRUCTION     CHOLECYSTECTOMY     COLONOSCOPY  12/09/2018   [facility].  3 mm sessile polyp, hepatic flexure. Mild - Moderate diverticulosis, sigmoid colon.   CYSTOCELE REPAIR     ESOPHAGOGASTRODUODENOSCOPY  12/09/2018   [facility]. Normal upper GI tract   RECTOCELE REPAIR     TENDON REPAIR     TOTAL ABDOMINAL HYSTERECTOMY     TOTAL HIP ARTHROPLASTY Right 08/25/2021   Procedure: RIGHT TOTAL HIP ARTHROPLASTY ANTERIOR APPROACH;  Surgeon: Leandrew Koyanagi, MD;  Location: [place];  Service: Orthopedics;  Laterality: Right;  3-C   TOTAL HIP ARTHROPLASTY Left 04/06/2022   Procedure: LEFT TOTAL HIP ARTHROPLASTY ANTERIOR APPROACH;  Surgeon: Leandrew Koyanagi, MD;  Location: [place];  Service: Orthopedics;  Laterality: Left;   TUBAL LIGATION       Social History:  The patient  reports that she has never smoked. She has never used smokeless tobacco. She reports that she does not currently use alcohol. She reports that she does not use drugs.   Family History:  The  patient's family history includes Diabetes in her maternal grandmother; Hypertension in her father; Pancreatic cancer in her father.    ROS:  Please see the history of present illness. All other systems are reviewed and  Negative to the above problem except as noted.    PHYSICAL EXAM: VS:  BP 118/64   Pulse 61   Ht '5\' 6"'$  (1.676 m)   Wt 167 lb (75.8 kg)   LMP 11/02/1993   SpO2 98%   BMI 26.95 kg/m   GEN: Well nourished, well developed, in no acute distress  HEENT: normal  Neck: no JVD, carotid bruits, or masses Cardiac: RRR; no murmurs, rubs, or gallops,no edema  Respiratory:  clear to auscultation bilaterally, normal work of breathing GI: soft, nontender, nondistended, + BS  No hepatomegaly  MS: no deformity Moving all extremities   Skin: warm and dry, no rash Neuro:  Strength and sensation are intact Psych: euthymic mood, full affect   EKG:  EKG is ordered today.  NSR 86 bpm   LBBB   Lipid Panel    Component Value Date/Time   CHOL 175 10/01/2021 1023   TRIG 95 10/01/2021 1023   HDL 52 10/01/2021 1023   CHOLHDL 3.4 10/01/2021 1023   LDLCALC 104 (H) 10/01/2021 1023      Wt Readings from Last 3 Encounters:  08/04/22 167 lb (75.8 kg)  07/01/22 169 lb (76.7 kg)  04/06/22 175 lb (79.4 kg)      ASSESSMENT AND PLAN:  1  Chest pain   One spell of anterior chest pressure   Myoview normal     ? GI in origin     She is now having back pressure / pain with activity, eases when slows  Some SOB ? If myoview a false negative Will set up for a coronary CT angiogram to evaluate for CAD and to evalaute aorta.    Activitis as tolerated for now  2  LBBB   Myoiew normal   For CT  3  Lipids   Will check lipomed, Lpa nad ApoB      4  Obesity     She has lost signficant wt with Mancel [place]   It does effect appetite     Her diet is signifciantly differernt from before      She is currently tapering  [place]    Follow up based on test results    Current medicines are reviewed at  length with the patient today.  The patient does not have concerns regarding medicines.  Signed, Dorris Carnes, MD  08/04/2022 10:06 AM    [address] [place], [place], [place]  [address] Phone: [phone]; Fax: [phone]

## 2022-08-05 LAB — NMR, LIPOPROFILE
Cholesterol, Total: 147 mg/dL (ref 100–199)
HDL Particle Number: 30.6 umol/L (ref >=30.5)
HDL-C: 48 mg/dL (ref >39)
LDL Particle Number: 1075 nmol/L — ABNORMAL HIGH (ref <1000)
LDL Size: 20.9 nm (ref >20.5)
LDL-C (NIH Calc): 82 mg/dL (ref 0–99)
LP-IR Score: 51 — ABNORMAL HIGH (ref <=45)
Small LDL Particle Number: 502 nmol/L (ref <=527)
Triglycerides: 89 mg/dL (ref 0–149)

## 2022-08-05 LAB — BASIC METABOLIC PANEL
BUN/Creatinine Ratio: 33 — ABNORMAL HIGH (ref 12–28)
BUN: 28 mg/dL — ABNORMAL HIGH (ref 8–27)
CO2: 27 mmol/L (ref 20–29)
Calcium: 10 mg/dL (ref 8.7–10.3)
Chloride: 101 mmol/L (ref 96–106)
Creatinine, Ser: 0.86 mg/dL (ref 0.57–1.00)
Glucose: 87 mg/dL (ref 70–99)
Potassium: 5.1 mmol/L (ref 3.5–5.2)
Sodium: 142 mmol/L (ref 134–144)
eGFR: 77 mL/min/{1.73_m2} (ref >59)

## 2022-08-05 LAB — HEMOGLOBIN A1C
Est. average glucose Bld gHb Est-mCnc: 103 mg/dL
Hgb A1c MFr Bld: 5.2 % (ref 4.8–5.6)

## 2022-08-05 LAB — APOLIPOPROTEIN B: Apolipoprotein B: 70 mg/dL (ref <90)

## 2022-08-05 LAB — LIPOPROTEIN A (LPA): Lipoprotein (a): 22.1 nmol/L (ref <75.0)

## 2022-08-14 NOTE — Addendum Note (Signed)
Addended by: Janan Halter F on: 08/14/2022 10:01 AM   Modules accepted: Orders

## 2022-08-20 ENCOUNTER — Telehealth (HOSPITAL_COMMUNITY): Payer: Self-pay | Admitting: *Deleted

## 2022-08-20 NOTE — Telephone Encounter (Signed)
Reaching out to patient to offer assistance regarding upcoming cardiac imaging study; pt verbalizes understanding of appt date/time, parking situation and where to check in, medications ordered, and verified current allergies; name and call back number provided for further questions should they arise  Gordy Clement RN Navigator Cardiac Imaging [facility] [phone] office [phone] cell  Patient to take '50mg'$  metoprolol tartrate two hours prior to her cardiac CT scan.  She is aware to arrive at 8am.

## 2022-08-21 ENCOUNTER — Ambulatory Visit (HOSPITAL_COMMUNITY)
Admission: RE | Admit: 2022-08-21 | Discharge: 2022-08-21 | Disposition: A | Discharge status: Home / Self Care | Payer: Managed Care, Other (non HMO) | Source: Ambulatory Visit | Attending: Internal Medicine | Admitting: Internal Medicine

## 2022-08-21 ENCOUNTER — Ambulatory Visit: Payer: Managed Care, Other (non HMO) | Admitting: Family Medicine

## 2022-08-21 DIAGNOSIS — R079 Chest pain, unspecified: Secondary | ICD-10-CM | POA: Diagnosis present

## 2022-08-21 MED ORDER — NITROGLYCERIN 0.4 MG SL SUBL
0.8000 mg | SUBLINGUAL_TABLET | Freq: Once | SUBLINGUAL | Status: AC
  Administered 2022-08-21: 0.8 mg via SUBLINGUAL

## 2022-08-21 MED ORDER — NITROGLYCERIN 0.4 MG SL SUBL
SUBLINGUAL_TABLET | SUBLINGUAL | Status: AC
  Filled 2022-08-21: qty 2

## 2022-08-21 MED ORDER — IOHEXOL 350 MG/ML SOLN
100.0000 mL | Freq: Once | INTRAVENOUS | Status: AC | PRN
  Administered 2022-08-21: 100 mL via INTRAVENOUS

## 2022-08-25 ENCOUNTER — Other Ambulatory Visit: Payer: Self-pay | Admitting: Family Medicine

## 2022-08-27 ENCOUNTER — Ambulatory Visit: Payer: Managed Care, Other (non HMO) | Admitting: Internal Medicine

## 2022-09-04 ENCOUNTER — Ambulatory Visit (INDEPENDENT_AMBULATORY_CARE_PROVIDER_SITE_OTHER): Payer: Managed Care, Other (non HMO) | Admitting: Family Medicine

## 2022-09-04 ENCOUNTER — Encounter: Payer: Self-pay | Admitting: Family Medicine

## 2022-09-04 VITALS — BP 114/69 | HR 67 | Ht 66.0 in | Wt 171.0 lb

## 2022-09-04 DIAGNOSIS — R634 Abnormal weight loss: Secondary | ICD-10-CM

## 2022-09-04 DIAGNOSIS — Z86718 Personal history of other venous thrombosis and embolism: Secondary | ICD-10-CM

## 2022-09-04 DIAGNOSIS — E038 Other specified hypothyroidism: Secondary | ICD-10-CM

## 2022-09-04 DIAGNOSIS — L0100 Impetigo, unspecified: Secondary | ICD-10-CM

## 2022-09-04 DIAGNOSIS — Z23 Encounter for immunization: Secondary | ICD-10-CM | POA: Diagnosis not present

## 2022-09-04 DIAGNOSIS — R0600 Dyspnea, unspecified: Secondary | ICD-10-CM | POA: Diagnosis not present

## 2022-09-04 DIAGNOSIS — E063 Autoimmune thyroiditis: Secondary | ICD-10-CM

## 2022-09-04 DIAGNOSIS — I10 Essential (primary) hypertension: Secondary | ICD-10-CM

## 2022-09-04 MED ORDER — MUPIROCIN 2 % EX OINT: 1.0000 | TOPICAL_OINTMENT | Freq: Two times a day (BID) | CUTANEOUS | 0 refills | Status: DC
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
## 2022-09-04 NOTE — Assessment & Plan Note (Signed)
BP remains well controlled with current medications.  We'll plan to continue at current strength.

## 2022-09-04 NOTE — Assessment & Plan Note (Signed)
Her weight has improved significantly with Wegovy.  Increased some since dropping dose.  She will increase back to 2.'4mg'$  dose.  She has been doing this around every 10 days rather than weekly and this has been effective for her.  Ok to continue with current dosing.

## 2022-09-04 NOTE — Assessment & Plan Note (Signed)
Symptoms consistent with impetigo.  Continue mupirocin until resolution.

## 2022-09-04 NOTE — Assessment & Plan Note (Signed)
Continues to have exertional dyspnea with pain radiating into the back.  Coronary calcium score of 0. Had blood clot when she was 10 and told that she had Factor V leiden.  Checking D-Dimer and hypercoagulability panel.

## 2022-09-04 NOTE — Assessment & Plan Note (Signed)
TSH has been stable.

## 2022-09-06 ENCOUNTER — Other Ambulatory Visit: Payer: Self-pay | Admitting: Family Medicine

## 2022-09-06 DIAGNOSIS — R072 Precordial pain: Secondary | ICD-10-CM

## 2022-09-06 DIAGNOSIS — R0600 Dyspnea, unspecified: Secondary | ICD-10-CM

## 2022-09-07 ENCOUNTER — Encounter: Payer: Self-pay | Admitting: Family Medicine

## 2022-09-08 ENCOUNTER — Ambulatory Visit (INDEPENDENT_AMBULATORY_CARE_PROVIDER_SITE_OTHER): Payer: Managed Care, Other (non HMO)

## 2022-09-08 DIAGNOSIS — R0600 Dyspnea, unspecified: Secondary | ICD-10-CM | POA: Diagnosis not present

## 2022-09-08 DIAGNOSIS — R072 Precordial pain: Secondary | ICD-10-CM

## 2022-09-08 MED ORDER — IOHEXOL 350 MG/ML SOLN
100.0000 mL | Freq: Once | INTRAVENOUS | Status: AC | PRN
  Administered 2022-09-08: 100 mL via INTRAVENOUS

## 2022-09-08 NOTE — Addendum Note (Signed)
Addended by: Perlie Mayo on: 09/08/2022 10:18 AM   Modules accepted: Orders

## 2022-09-15 ENCOUNTER — Other Ambulatory Visit: Payer: Self-pay | Admitting: Family Medicine

## 2022-09-15 DIAGNOSIS — D6859 Other primary thrombophilia: Secondary | ICD-10-CM

## 2022-09-17 LAB — VENOUS THROMBOSIS HYPERCOAGULABILITY PANEL WITH REFLEX
ACTIVATED PROTEIN C RESISTANCE: 1.6 ratio — ABNORMAL LOW (ref >=2.1)
AntiThromb III Func: 120 % normal (ref 80–135)
Anticardiolipin IgG: >112 GPL-U/mL — ABNORMAL HIGH (ref <20.0)
Anticardiolipin IgM: 2.5 MPL-U/mL (ref <20.0)
Beta-2 Glyco 1 IgM: 5.3 U/mL (ref <20.0)
Beta-2 Glyco I IgG: >112 U/mL — ABNORMAL HIGH (ref <20.0)
Factor-VIII Activity: 72 % normal (ref 50–180)
PROTEIN S ANTIGEN, TOTAL: 97 % normal (ref 70–140)
PROTHROMBIN (FACTOR II): NEGATIVE
PTT-LA Screen: 52 s — ABNORMAL HIGH (ref <=40)
Protein C Activity: 186 % normal — ABNORMAL HIGH (ref 70–180)
Protein S Ag, Free: 84 % normal (ref 50–147)
dRVVT: 33 s (ref <=45)

## 2022-09-17 LAB — FACTOR V (LEIDEN) MUTATION: Result: POSITIVE — AB

## 2022-09-17 LAB — D-DIMER, QUANTITATIVE: D-Dimer, Quant: 1.58 mcg/mL FEU — ABNORMAL HIGH (ref <0.50)

## 2022-09-17 LAB — RFLX HEXAGONAL PHASE CONFIRM: Hexagonal Phase Conf: POSITIVE — AB

## 2022-09-17 LAB — THROMBIN CLOTTING TIME: Thrombin Clotting Time: 17 s (ref 13–19)

## 2022-09-18 ENCOUNTER — Encounter (INDEPENDENT_AMBULATORY_CARE_PROVIDER_SITE_OTHER): Payer: Self-pay

## 2022-09-25 ENCOUNTER — Other Ambulatory Visit: Payer: Self-pay | Admitting: Family Medicine

## 2022-09-25 DIAGNOSIS — J4530 Mild persistent asthma, uncomplicated: Secondary | ICD-10-CM

## 2022-09-29 ENCOUNTER — Other Ambulatory Visit: Payer: Self-pay | Admitting: Family

## 2022-09-29 DIAGNOSIS — D6859 Other primary thrombophilia: Secondary | ICD-10-CM

## 2022-09-30 ENCOUNTER — Encounter: Payer: Self-pay | Admitting: Family

## 2022-09-30 ENCOUNTER — Inpatient Hospital Stay (HOSPITAL_BASED_OUTPATIENT_CLINIC_OR_DEPARTMENT_OTHER): Payer: Managed Care, Other (non HMO) | Admitting: Family

## 2022-09-30 ENCOUNTER — Inpatient Hospital Stay: Payer: Managed Care, Other (non HMO) | Attending: Hematology & Oncology

## 2022-09-30 VITALS — BP 156/99 | HR 73 | Resp 18 | Ht 66.0 in | Wt 172.0 lb

## 2022-09-30 DIAGNOSIS — G4730 Sleep apnea, unspecified: Secondary | ICD-10-CM | POA: Insufficient documentation

## 2022-09-30 DIAGNOSIS — Z79899 Other long term (current) drug therapy: Secondary | ICD-10-CM | POA: Insufficient documentation

## 2022-09-30 DIAGNOSIS — I82412 Acute embolism and thrombosis of left femoral vein: Secondary | ICD-10-CM | POA: Diagnosis not present

## 2022-09-30 DIAGNOSIS — D6859 Other primary thrombophilia: Secondary | ICD-10-CM | POA: Diagnosis not present

## 2022-09-30 DIAGNOSIS — D6851 Activated protein C resistance: Secondary | ICD-10-CM | POA: Insufficient documentation

## 2022-09-30 DIAGNOSIS — Z7982 Long term (current) use of aspirin: Secondary | ICD-10-CM | POA: Diagnosis not present

## 2022-09-30 DIAGNOSIS — I10 Essential (primary) hypertension: Secondary | ICD-10-CM | POA: Diagnosis not present

## 2022-09-30 DIAGNOSIS — E039 Hypothyroidism, unspecified: Secondary | ICD-10-CM | POA: Diagnosis not present

## 2022-09-30 LAB — CBC WITH DIFFERENTIAL (CANCER CENTER ONLY)
Abs Immature Granulocytes: 0.02 10*3/uL (ref 0.00–0.07)
Basophils Absolute: 0.1 10*3/uL (ref 0.0–0.1)
Basophils Relative: 1 %
Eosinophils Absolute: 0.4 10*3/uL (ref 0.0–0.5)
Eosinophils Relative: 6 %
HCT: 41 % (ref 36.0–46.0)
Hemoglobin: 13 g/dL (ref 12.0–15.0)
Immature Granulocytes: 0 %
Lymphocytes Relative: 30 %
Lymphs Abs: 1.7 10*3/uL (ref 0.7–4.0)
MCH: 26.9 pg (ref 26.0–34.0)
MCHC: 31.7 g/dL (ref 30.0–36.0)
MCV: 84.9 fL (ref 80.0–100.0)
Monocytes Absolute: 0.3 10*3/uL (ref 0.1–1.0)
Monocytes Relative: 4 %
Neutro Abs: 3.4 10*3/uL (ref 1.7–7.7)
Neutrophils Relative %: 59 %
Platelet Count: 240 10*3/uL (ref 150–400)
RBC: 4.83 MIL/uL (ref 3.87–5.11)
RDW: 13.5 % (ref 11.5–15.5)
WBC Count: 5.8 10*3/uL (ref 4.0–10.5)
nRBC: 0 % (ref 0.0–0.2)

## 2022-09-30 LAB — CMP (CANCER CENTER ONLY)
ALT: 13 U/L (ref 0–44)
AST: 15 U/L (ref 15–41)
Albumin: 4.2 g/dL (ref 3.5–5.0)
Alkaline Phosphatase: 75 U/L (ref 38–126)
Anion gap: 6 (ref 5–15)
BUN: 23 mg/dL — ABNORMAL HIGH (ref 6–20)
CO2: 30 mmol/L (ref 22–32)
Calcium: 9.7 mg/dL (ref 8.9–10.3)
Chloride: 104 mmol/L (ref 98–111)
Creatinine: 0.78 mg/dL (ref 0.44–1.00)
GFR, Estimated: >60 mL/min (ref >60)
Glucose, Bld: 90 mg/dL (ref 70–99)
Potassium: 4 mmol/L (ref 3.5–5.1)
Sodium: 140 mmol/L (ref 135–145)
Total Bilirubin: 0.4 mg/dL (ref 0.3–1.2)
Total Protein: 6.7 g/dL (ref 6.5–8.1)

## 2022-09-30 LAB — LACTATE DEHYDROGENASE: LDH: 168 U/L (ref 98–192)

## 2022-09-30 LAB — D-DIMER, QUANTITATIVE: D-Dimer, Quant: 0.85 ug/mL-FEU — ABNORMAL HIGH (ref 0.00–0.50)
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
## 2022-10-01 LAB — PROTEIN S ACTIVITY: Protein S Activity: 46 % — ABNORMAL LOW (ref 63–140)

## 2022-10-01 LAB — HOMOCYSTEINE: Homocysteine: 15.9 umol/L — ABNORMAL HIGH (ref 0.0–14.5)

## 2022-10-01 LAB — PROTEIN S, TOTAL: Protein S Ag, Total: 66 % (ref 60–150)

## 2022-10-02 ENCOUNTER — Ambulatory Visit (INDEPENDENT_AMBULATORY_CARE_PROVIDER_SITE_OTHER): Payer: Managed Care, Other (non HMO)

## 2022-10-02 ENCOUNTER — Encounter: Payer: Self-pay | Admitting: *Deleted

## 2022-10-02 ENCOUNTER — Telehealth: Payer: Self-pay | Admitting: *Deleted

## 2022-10-02 ENCOUNTER — Ambulatory Visit (INDEPENDENT_AMBULATORY_CARE_PROVIDER_SITE_OTHER): Payer: Managed Care, Other (non HMO) | Admitting: Orthopaedic Surgery

## 2022-10-02 DIAGNOSIS — Z96642 Presence of left artificial hip joint: Secondary | ICD-10-CM | POA: Diagnosis not present

## 2022-10-02 DIAGNOSIS — I82412 Acute embolism and thrombosis of left femoral vein: Secondary | ICD-10-CM

## 2022-10-02 DIAGNOSIS — D6859 Other primary thrombophilia: Secondary | ICD-10-CM

## 2022-10-02 NOTE — Telephone Encounter (Signed)
Notified pt of lab results. Instructed pt to start taking folic acid and continue same regimen with aspirin.

## 2022-10-02 NOTE — Telephone Encounter (Signed)
-----   Message from Celso Amy, NP sent at 10/02/2022 12:54 PM EST ----- Over all labs look ok slightly low protein S level and mildly elevated Homocysteine level. Both can alter clotting times (clot quicker). She can start taking daily folic acid over the counter. Continue same regimen with aspirin. Call us if she needs anything!   Sarag ----- Message ----- From: Buel Ream, Lab In [place] Sent: 09/30/2022   2:38 PM EST To: Celso Amy, NP

## 2022-10-02 NOTE — Progress Notes (Signed)
Post-Op Visit Note   Patient: Christy Todd           Date of Birth: 08-31-1961           MRN: [id number] Visit Date: 10/02/2022 PCP: Everrett Coombe, DO   Assessment & Plan:  Chief Complaint:  Chief Complaint  Patient presents with   Left Hip - Routine Post Op   Visit Diagnoses:  1. Status post total replacement of left hip     Plan: Julana is now 6 months status post left total hip replacement in 1 year status post right total hip replacement.  She has no complaints and very happy with her outcome.  Since we last saw her she has been diagnosed with factor V Leiden.  Examination of both hips show fully healed surgical scars.  Fluid painless range of motion.  Her x-rays demonstrate stable implants without any complications in good position.  Dental prophylaxis reinforced.  Activity as tolerated.  Recheck in 6 months with standing AP pelvis x-rays.  Follow-Up Instructions: Return in about 6 months (around 04/03/2023).   Orders:  Orders Placed This Encounter  Procedures   XR Pelvis 1-2 Views   No orders of the defined types were placed in this encounter.   Imaging: XR Pelvis 1-2 Views  Result Date: 10/02/2022 Stable bilateral total hip replacement without complications   PMFS History: Patient Active Problem List   Diagnosis Date Noted   Dyspnea 09/04/2022   Impetigo 09/04/2022   Chest pain 07/06/2022   Status post total replacement of left hip 04/06/2022   Clavicle pain 02/13/2022   Acute sinusitis 02/13/2022   Primary osteoarthritis of left hip 11/21/2021   Orthostatic hypotension 09/22/2021   Dizziness 09/22/2021   Weight loss 09/22/2021   Status post total replacement of right hip 08/25/2021   Morbid obesity (HCC) 11/14/2020   Sleep behavior disorder, 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 Cymbalta is used for pain control   GERD (gastroesophageal reflux disease)    Hypertension    Hypothyroid    Pre-diabetes    pt states she is no longer pre-diabetic due to weight loss   Sleep apnea    on CPAP   Wears hearing aid in right ear     Family History  Problem Relation Age of Onset   Hypertension Father    Pancreatic cancer Father    Diabetes Maternal Grandmother    Colon cancer Neg Hx    Stomach cancer Neg Hx    Esophageal cancer Neg Hx     Past Surgical History:  Procedure Laterality Date   ABDOMINAL HYSTERECTOMY     ANKLE RECONSTRUCTION     CHOLECYSTECTOMY     COLONOSCOPY  12/09/2018   Dr. Gerlene Burdock [facility].  3 mm sessile polyp, hepatic flexure. Mild - Moderate diverticulosis, sigmoid colon.   CYSTOCELE REPAIR     ESOPHAGOGASTRODUODENOSCOPY  12/09/2018  Dr. Ferdinand Lango[facility]. Normal upper GI tract   RECTOCELE REPAIR     TENDON REPAIR     TOTAL ABDOMINAL HYSTERECTOMY     TOTAL HIP ARTHROPLASTY Right 08/25/2021   Procedure: RIGHT TOTAL HIP ARTHROPLASTY ANTERIOR APPROACH;  Surgeon: Tarry Kos, MD;  Location[facility];  Service: Orthopedics;  Laterality: Right;  3-C   TOTAL HIP ARTHROPLASTY Left 04/06/2022   Procedure: LEFT TOTAL HIP ARTHROPLASTY ANTERIOR APPROACH;  Surgeon: Tarry Kos, MD;  Location: [facility];  Service: Orthopedics;  Laterality: Left;   TUBAL LIGATION     Social History   Occupational History   Not on file  Tobacco Use   Smoking status: Never   Smokeless tobacco: Never  Vaping Use   Vaping Use:  Never used  Substance and Sexual Activity   Alcohol use: Not Currently   Drug use: Never   Sexual activity: Yes    Partners: Male

## 2022-10-06 NOTE — Telephone Encounter (Signed)
Per 09/30/22 los -  Follow-up as needed.

## 2022-11-06 ENCOUNTER — Telehealth: Payer: Managed Care, Other (non HMO) | Admitting: Physician Assistant

## 2022-11-06 DIAGNOSIS — B9689 Other specified bacterial agents as the cause of diseases classified elsewhere: Secondary | ICD-10-CM

## 2022-11-06 DIAGNOSIS — J019 Acute sinusitis, unspecified: Secondary | ICD-10-CM

## 2022-11-06 MED ORDER — AMOXICILLIN-POT CLAVULANATE 875-125 MG PO TABS: 1.0000 | ORAL_TABLET | Freq: Two times a day (BID) | ORAL | 0 refills | Status: DC

## 2022-11-06 NOTE — Progress Notes (Signed)
Virtual Visit Consent   Christy Todd, you are scheduled for a virtual visit with a [facility] provider today. Just as with appointments in the office, your consent must be obtained to participate. Your consent will be active for this visit and any virtual visit you may have with one of our providers in the next 365 days. If you have a MyChart account, a copy of this consent can be sent to you electronically.  As this is a virtual visit, video technology does not allow for your provider to perform a traditional examination. This may limit your provider's ability to fully assess your condition. If your provider identifies any concerns that need to be evaluated in person or the need to arrange testing (such as labs, EKG, etc.), we will make arrangements to do so. Although advances in technology are sophisticated, we cannot ensure that it will always work on either your end or our end. If the connection with a video visit is poor, the visit may have to be switched to a telephone visit. With either a video or telephone visit, we are not always able to ensure that we have a secure connection.  By engaging in this virtual visit, you consent to the provision of healthcare and authorize for your insurance to be billed (if applicable) for the services provided during this visit. Depending on your insurance coverage, you may receive a charge related to this service.  I need to obtain your verbal consent now. Are you willing to proceed with your visit today? Christy Todd has provided verbal consent on 11/06/2022 for a virtual visit (video or telephone). Mar Daring, PA-C  Date: 11/06/2022 12:37 PM  Virtual Visit via Video Note   I, Mar Daring, connected with  Christy Todd  ([id number], May 07, 1961) on 11/06/22 at  1:00 PM EST by a video-enabled telemedicine application and verified that I am speaking with the correct person using two identifiers.  Location: Patient: Virtual Visit Location  Patient: Home Provider: Virtual Visit Location Provider: Home Office   I discussed the limitations of evaluation and management by telemedicine and the availability of in person appointments. The patient expressed understanding and agreed to proceed.    History of Present Illness: Christy Todd is a 62 y.o. who identifies as a female who was assigned female at birth, and is being seen today for possible sinus infection.  HPI: Sinusitis This is a new problem. The current episode started 1 to 4 weeks ago (URI symptoms started 3 weeks ago then has progressed to sinus issues). The problem has been gradually worsening since onset. The maximum temperature recorded prior to her arrival was 100.4 - 100.9 F. The fever has been present for Less than 1 day. The pain is moderate. Associated symptoms include congestion, coughing, headaches, a hoarse voice and sinus pressure (left is worse). Pertinent negatives include no chills, ear pain or sore throat. Treatments tried: advil, coridin hbp. The treatment provided no relief.   Negative at home testing for Covid 19 multiple testing.   Problems:  Patient Active Problem List   Diagnosis Date Noted  . Dyspnea 09/04/2022  . Impetigo 09/04/2022  . Chest pain 07/06/2022  . Status post total replacement of left hip 04/06/2022  . Clavicle pain 02/13/2022  . Acute sinusitis 02/13/2022  . Primary osteoarthritis of left hip 11/21/2021  . Orthostatic hypotension 09/22/2021  . Dizziness 09/22/2021  . Weight loss 09/22/2021  . Status post total replacement of right hip 08/25/2021  .  Morbid obesity ([place]) 11/14/2020  . Sleep behavior disorder, 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  . History of seizure 07/02/2020  . Obesity with alveolar hypoventilation and body mass index (BMI) of 40 or greater ([place]) 07/02/2020  . Severe obstructive sleep apnea-hypopnea syndrome 07/02/2020  . OSA on CPAP 07/02/2020  . Essential hypertension 05/07/2020  . Chronic ankle pain 05/07/2020  .  Degenerative joint disease (DJD) of hip 05/07/2020  . Seizure disorder ([place]) 05/07/2020  . Hypothyroidism 05/07/2020  . Mild persistent asthma 05/07/2020    Allergies:  Allergies  Allergen Reactions  . Ciprofloxacin Other (See Comments)    Detatched muscles and tendons   . Apple Juice     ALL form of apple  Chemical burn to mouth and gums  . Gluten Meal Other (See Comments)    Gut Distress  . Benzoin   . Claritin [Loratadine] Other (See Comments)    allergic psychosis  . Septra [Sulfamethoxazole-Trimethoprim] Rash   Medications:  Current Outpatient Medications:  .  amoxicillin-clavulanate (AUGMENTIN) 875-125 MG tablet, Take 1 tablet by mouth 2 (two) times daily., Disp: 20 tablet, Rfl: 0 .  albuterol (VENTOLIN HFA) 108 (90 Base) MCG/ACT inhaler, Inhale 2 puffs into the lungs every 6 (six) hours as needed for wheezing or shortness of breath., Disp: 8 g, Rfl: 0 .  aspirin EC 81 MG tablet, Take 1 tablet (81 mg total) by mouth 2 (two) times daily. To be taken after surgery to prevent blood clots (Patient taking differently: Take 81 mg by mouth daily. To be taken after surgery to prevent blood clots), Disp: 84 tablet, Rfl: 0 .  Cholecalciferol (VITAMIN D3) 50 MCG (2000 UT) TABS, Take 2,000 Units by mouth daily., Disp: , Rfl:  .  DULoxetine (CYMBALTA) 30 MG capsule, Take 30 mg by mouth daily., Disp: , Rfl:  .  levothyroxine (SYNTHROID) 125 MCG tablet, TAKE 1 TABLET BY MOUTH EVERY DAY BEFORE BREAKFAST, Disp: 90 tablet, Rfl: 1 .  lisinopril (ZESTRIL) 10 MG tablet, Take 1 tablet (10 mg total) by mouth daily., Disp: 90 tablet, Rfl: 3 .  Magnesium 200 MG TABS, Take 200 mg by mouth daily., Disp: , Rfl:  .  montelukast (SINGULAIR) 10 MG tablet, TAKE 1 TABLET BY MOUTH EVERY DAY, Disp: 90 tablet, Rfl: 0 .  mupirocin ointment (BACTROBAN) 2 %, Apply 1 Application topically 2 (two) times daily., Disp: 22 g, Rfl: 0 .  NON FORMULARY, Pt uses cpap nightly, Disp: , Rfl:  .  omeprazole (PRILOSEC) 20 MG  capsule, Take 20 mg by mouth every other day., Disp: , Rfl:  .  Semaglutide-Weight Management (WEGOVY) 1.7 MG/0.75ML SOAJ, Inject 1.7 mg into the skin once a week., Disp: 3 mL, Rfl: 2 .  spironolactone (ALDACTONE) 50 MG tablet, TAKE 1 TABLET BY MOUTH EVERY DAY (Patient taking differently: Take 25 mg by mouth daily.), Disp: 90 tablet, Rfl: 1  Observations/Objective: Patient is well-developed, well-nourished in no acute distress.  Resting comfortably at home.  Head is normocephalic, atraumatic.  No labored breathing. Speech is clear and coherent with logical content.  Patient is alert and oriented at baseline.    Assessment and Plan: 1. Acute bacterial sinusitis - amoxicillin-clavulanate (AUGMENTIN) 875-125 MG tablet; Take 1 tablet by mouth 2 (two) times daily.  Dispense: 20 tablet; Refill: 0  - Worsening symptoms that have not responded to OTC medications.  - Will give Augmentin - Continue allergy medications.  - Steam and humidifier can help - Stay well hydrated and get plenty of rest.  - Seek in  person evaluation if no symptom improvement or if symptoms worsen   Follow Up Instructions: I discussed the assessment and treatment plan with the patient. The patient was provided an opportunity to ask questions and all were answered. The patient agreed with the plan and demonstrated an understanding of the instructions.  A copy of instructions were sent to the patient via MyChart unless otherwise noted below.    The patient was advised to call back or seek an in-person evaluation if the symptoms worsen or if the condition fails to improve as anticipated.  Time:  I spent 10 minutes with the patient via telehealth technology discussing the above problems/concerns.    Mar Daring, PA-C

## 2022-11-06 NOTE — Patient Instructions (Signed)
Christy Todd, thank you for joining Mar Daring, PA-C for today's virtual visit.  While this provider is not your primary care provider (PCP), if your PCP is located in our provider database this encounter information will be shared with them immediately following your visit.   [facility] account gives you access to today's visit and all your visits, tests, and labs performed at [facility] " click here if you don't have a [facility] account or go to [email or web address]  Consent: (Patient) Christy Todd provided verbal consent for this virtual visit at the beginning of the encounter.  Current Medications:  Current Outpatient Medications:    amoxicillin-clavulanate (AUGMENTIN) 875-125 MG tablet, Take 1 tablet by mouth 2 (two) times daily., Disp: 20 tablet, Rfl: 0   albuterol (VENTOLIN HFA) 108 (90 Base) MCG/ACT inhaler, Inhale 2 puffs into the lungs every 6 (six) hours as needed for wheezing or shortness of breath., Disp: 8 g, Rfl: 0   aspirin EC 81 MG tablet, Take 1 tablet (81 mg total) by mouth 2 (two) times daily. To be taken after surgery to prevent blood clots (Patient taking differently: Take 81 mg by mouth daily. To be taken after surgery to prevent blood clots), Disp: 84 tablet, Rfl: 0   Cholecalciferol (VITAMIN D3) 50 MCG (2000 UT) TABS, Take 2,000 Units by mouth daily., Disp: , Rfl:    DULoxetine (CYMBALTA) 30 MG capsule, Take 30 mg by mouth daily., Disp: , Rfl:    levothyroxine (SYNTHROID) 125 MCG tablet, TAKE 1 TABLET BY MOUTH EVERY DAY BEFORE BREAKFAST, Disp: 90 tablet, Rfl: 1   lisinopril (ZESTRIL) 10 MG tablet, Take 1 tablet (10 mg total) by mouth daily., Disp: 90 tablet, Rfl: 3   Magnesium 200 MG TABS, Take 200 mg by mouth daily., Disp: , Rfl:    montelukast (SINGULAIR) 10 MG tablet, TAKE 1 TABLET BY MOUTH EVERY DAY, Disp: 90 tablet, Rfl: 0   mupirocin ointment (BACTROBAN) 2 %, Apply 1 Application topically 2 (two) times daily.,  Disp: 22 g, Rfl: 0   NON FORMULARY, Pt uses cpap nightly, Disp: , Rfl:    omeprazole (PRILOSEC) 20 MG capsule, Take 20 mg by mouth every other day., Disp: , Rfl:    Semaglutide-Weight Management (WEGOVY) 1.7 MG/0.75ML SOAJ, Inject 1.7 mg into the skin once a week., Disp: 3 mL, Rfl: 2   spironolactone (ALDACTONE) 50 MG tablet, TAKE 1 TABLET BY MOUTH EVERY DAY (Patient taking differently: Take 25 mg by mouth daily.), Disp: 90 tablet, Rfl: 1   Medications ordered in this encounter:  Meds ordered this encounter  Medications   amoxicillin-clavulanate (AUGMENTIN) 875-125 MG tablet    Sig: Take 1 tablet by mouth 2 (two) times daily.    Dispense:  20 tablet    Refill:  0    Order Specific Question:   Supervising Provider    Answer:   Chase Picket [id number]     *If you need refills on other medications prior to your next appointment, please contact your pharmacy*  Follow-Up: Call back or seek an in-person evaluation if the symptoms worsen or if the condition fails to improve as anticipated.  [facility] [phone]  Other Instructions Sinus Infection, Adult A sinus infection, also called sinusitis, is inflammation of your sinuses. Sinuses are hollow spaces in the bones around your face. Your sinuses are located: Around your eyes. In the middle of your forehead. Behind your nose. In your cheekbones. Mucus normally drains  out of your sinuses. When your nasal tissues become inflamed or swollen, mucus can become trapped or blocked. This allows bacteria, viruses, and fungi to grow, which leads to infection. Most infections of the sinuses are caused by a virus. A sinus infection can develop quickly. It can last for up to 4 weeks (acute) or for more than 12 weeks (chronic). A sinus infection often develops after a cold. What are the causes? This condition is caused by anything that creates swelling in the sinuses or stops mucus from draining. This  includes: Allergies. Asthma. Infection from bacteria or viruses. Deformities or blockages in your nose or sinuses. Abnormal growths in the nose (nasal polyps). Pollutants, such as chemicals or irritants in the air. Infection from fungi. This is rare. What increases the risk? You are more likely to develop this condition if you: Have a weak body defense system (immune system). Do a lot of swimming or diving. Overuse nasal sprays. Smoke. What are the signs or symptoms? The main symptoms of this condition are pain and a feeling of pressure around the affected sinuses. Other symptoms include: Stuffy nose or congestion that makes it difficult to breathe through your nose. Thick yellow or greenish drainage from your nose. Tenderness, swelling, and warmth over the affected sinuses. A cough that may get worse at night. Decreased sense of smell and taste. Extra mucus that collects in the throat or the back of the nose (postnasal drip) causing a sore throat or bad breath. Tiredness (fatigue). Fever. How is this diagnosed? This condition is diagnosed based on: Your symptoms. Your medical history. A physical exam. Tests to find out if your condition is acute or chronic. This may include: Checking your nose for nasal polyps. Viewing your sinuses using a device that has a light (endoscope). Testing for allergies or bacteria. Imaging tests, such as an MRI or CT scan. In rare cases, a bone biopsy may be done to rule out more serious types of fungal sinus disease. How is this treated? Treatment for a sinus infection depends on the cause and whether your condition is chronic or acute. If caused by a virus, your symptoms should go away on their own within 10 days. You may be given medicines to relieve symptoms. They include: Medicines that shrink swollen nasal passages (decongestants). A spray that eases inflammation of the nostrils (topical intranasal corticosteroids). Rinses that help get rid  of thick mucus in your nose (nasal saline washes). Medicines that treat allergies (antihistamines). Over-the-counter pain relievers. If caused by bacteria, your health care provider may recommend waiting to see if your symptoms improve. Most bacterial infections will get better without antibiotic medicine. You may be given antibiotics if you have: A severe infection. A weak immune system. If caused by narrow nasal passages or nasal polyps, surgery may be needed. Follow these instructions at home: Medicines Take, use, or apply over-the-counter and prescription medicines only as told by your health care provider. These may include nasal sprays. If you were prescribed an antibiotic medicine, take it as told by your health care provider. Do not stop taking the antibiotic even if you start to feel better. Hydrate and humidify  Drink enough fluid to keep your urine pale yellow. Staying hydrated will help to thin your mucus. Use a cool mist humidifier to keep the humidity level in your home above 50%. Inhale steam for 10-15 minutes, 3-4 times a day, or as told by your health care provider. You can do this in the bathroom while a  hot shower is running. Limit your exposure to cool or dry air. Rest Rest as much as possible. Sleep with your head raised (elevated). Make sure you get enough sleep each night. General instructions  Apply a warm, moist washcloth to your face 3-4 times a day or as told by your health care provider. This will help with discomfort. Use nasal saline washes as often as told by your health care provider. Wash your hands often with soap and water to reduce your exposure to germs. If soap and water are not available, use hand sanitizer. Do not smoke. Avoid being around people who are smoking (secondhand smoke). Keep all follow-up visits. This is important. Contact a health care provider if: You have a fever. Your symptoms get worse. Your symptoms do not improve within 10  days. Get help right away if: You have a severe headache. You have persistent vomiting. You have severe pain or swelling around your face or eyes. You have vision problems. You develop confusion. Your neck is stiff. You have trouble breathing. These symptoms may be an emergency. Get help right away. Call 911. Do not wait to see if the symptoms will go away. Do not drive yourself to the hospital. Summary A sinus infection is soreness and inflammation of your sinuses. Sinuses are hollow spaces in the bones around your face. This condition is caused by nasal tissues that become inflamed or swollen. The swelling traps or blocks the flow of mucus. This allows bacteria, viruses, and fungi to grow, which leads to infection. If you were prescribed an antibiotic medicine, take it as told by your health care provider. Do not stop taking the antibiotic even if you start to feel better. Keep all follow-up visits. This is important. This information is not intended to replace advice given to you by your health care provider. Make sure you discuss any questions you have with your health care provider. Document Revised: 09/23/2021 Document Reviewed: 09/23/2021 Elsevier Patient Education  [place].    If you have been instructed to have an in-person evaluation today at a local Urgent Care facility, please use the link below. It will take you to a list of all of our available [facility], including address, phone number and hours of operation. Please do not delay care.  [facility]  If you or a family member do not have a primary care provider, use the link below to schedule a visit and establish care. When you choose a [facility] primary care physician or advanced practice provider, you gain a long-term partner in health. Find a Primary Care Provider  Learn more about [facility] in-office and virtual care options: [facility]

## 2022-11-12 ENCOUNTER — Encounter: Payer: Self-pay | Admitting: Orthopaedic Surgery

## 2022-12-23 ENCOUNTER — Other Ambulatory Visit: Payer: Self-pay | Admitting: Family Medicine

## 2022-12-25 ENCOUNTER — Other Ambulatory Visit: Payer: Self-pay | Admitting: Family Medicine

## 2022-12-25 DIAGNOSIS — J4530 Mild persistent asthma, uncomplicated: Secondary | ICD-10-CM

## 2022-12-28 ENCOUNTER — Encounter: Payer: Self-pay | Admitting: Orthopaedic Surgery

## 2022-12-28 ENCOUNTER — Other Ambulatory Visit: Payer: Self-pay

## 2022-12-28 MED ORDER — AMOXICILLIN 500 MG PO TABS: ORAL_TABLET | ORAL | 2 refills | Status: DC

## 2023-01-08 ENCOUNTER — Ambulatory Visit: Payer: Managed Care, Other (non HMO) | Admitting: Family Medicine

## 2023-01-22 ENCOUNTER — Ambulatory Visit (INDEPENDENT_AMBULATORY_CARE_PROVIDER_SITE_OTHER): Payer: Managed Care, Other (non HMO) | Admitting: Family Medicine

## 2023-01-22 VITALS — BP 102/67 | HR 69 | Ht 66.0 in | Wt 180.0 lb

## 2023-01-22 DIAGNOSIS — B9689 Other specified bacterial agents as the cause of diseases classified elsewhere: Secondary | ICD-10-CM

## 2023-01-22 DIAGNOSIS — J0100 Acute maxillary sinusitis, unspecified: Secondary | ICD-10-CM

## 2023-01-22 DIAGNOSIS — M1651 Unilateral post-traumatic osteoarthritis, right hip: Secondary | ICD-10-CM

## 2023-01-22 DIAGNOSIS — R635 Abnormal weight gain: Secondary | ICD-10-CM

## 2023-01-22 DIAGNOSIS — J019 Acute sinusitis, unspecified: Secondary | ICD-10-CM | POA: Diagnosis not present

## 2023-01-22 MED ORDER — WEGOVY 2.4 MG/0.75ML ~~LOC~~ SOAJ: 2.4000 mg | SUBCUTANEOUS | 3 refills | Status: DC

## 2023-01-22 MED ORDER — AMOXICILLIN-POT CLAVULANATE 875-125 MG PO TABS: 1.0000 | ORAL_TABLET | Freq: Two times a day (BID) | ORAL | 0 refills | Status: AC

## 2023-01-22 NOTE — Patient Instructions (Addendum)
You may continue current supplements.   Use augmentin for sinus infection.   I have sent increased strength of wegovy

## 2023-01-22 NOTE — Progress Notes (Unsigned)
Pt would like to discuss conjugated linoic acid for weight loss, and taking tumeric and moringa leaf for inflammation.

## 2023-01-23 ENCOUNTER — Encounter: Payer: Self-pay | Admitting: Family Medicine

## 2023-01-23 DIAGNOSIS — R635 Abnormal weight gain: Secondary | ICD-10-CM | POA: Insufficient documentation

## 2023-01-23 NOTE — Assessment & Plan Note (Signed)
She has put some weight back on over the past few months.  Increasing Wegovy back to 2.4 mg weekly.  Continue to work on dietary changes.

## 2023-01-23 NOTE — Assessment & Plan Note (Signed)
Has had some improvement with use of Mirena leaf and turmeric.  No side effects from this.  She may continue this as needed.

## 2023-01-23 NOTE — Assessment & Plan Note (Signed)
She responded well to Augmentin in the past.  Adding course of Augmentin at home.  Encouraged adequate hydration.  She may want to consider antihistamine as well.

## 2023-01-23 NOTE — Progress Notes (Signed)
Christy Todd - 62 y.o. female MRN [id number]  Date of birth: Oct 30, 1961  Subjective Chief Complaint  Patient presents with   Hypothyroidism    HPI Christy Todd is a 62 year old female here today for follow-up visit.   She reports that she is feeling well.  She has had some weight gain since decreasing her strength of Wegovy.  She would like to increase this back to 2.4 mg weekly.  Additionally she is considering adding conjugated linoleic acid to help with weight loss as well.  She does continue to stay quite active.  She is walking frequently.  She has noticed some joint pain while walking.  Using combination of turmeric and renal leaf extract to help with pain and inflammation.  This combination seems to be working for her pretty well.  She denies any GI upset with this.  She has had increased sinus pain and pressure over the past couple weeks.  Similar to previous sinus infections.  She is having frontal headache.  Denies fever or chills.  ROS:  A comprehensive ROS was completed and negative except as noted per HPI  Allergies  Allergen Reactions   Ciprofloxacin Other (See Comments)    Detatched muscles and tendons    Apple Juice     ALL form of apple  Chemical burn to mouth and gums   Gluten Meal Other (See Comments)    Gut Distress   Benzoin    Claritin [Loratadine] Other (See Comments)    allergic psychosis   Septra [Sulfamethoxazole-Trimethoprim] Rash    Past Medical History:  Diagnosis Date   Arthritis    Asthma    COVID-19 07/23/2021   Depression    pt states she does not have depression. Cymbalta is used for pain control   GERD (gastroesophageal reflux disease)    Hypertension    Hypothyroid    Pre-diabetes    pt states she is no longer pre-diabetic due to weight loss   Sleep apnea    on CPAP   Wears hearing aid in right ear     Past Surgical History:  Procedure Laterality Date   ABDOMINAL HYSTERECTOMY     ANKLE RECONSTRUCTION     CHOLECYSTECTOMY      COLONOSCOPY  12/09/2018   Dr. [facility]ino [facility].  3 mm sessile polyp, hepatic flexure. Mild - Moderate diverticulosis, sigmoid colon.   CYSTOCELE REPAIR     ESOPHAGOGASTRODUODENOSCOPY  12/09/2018   Dr. [facility]. Normal upper GI tract   RECTOCELE REPAIR     TENDON REPAIR     TOTAL ABDOMINAL HYSTERECTOMY     TOTAL HIP ARTHROPLASTY Right 08/25/2021   Procedure: RIGHT TOTAL HIP ARTHROPLASTY ANTERIOR APPROACH;  Surgeon: Christy Koyanagi, MD;  Location: [facility];  Service: Orthopedics;  Laterality: Right;  3-C   TOTAL HIP ARTHROPLASTY Left 04/06/2022   Procedure: LEFT TOTAL HIP ARTHROPLASTY ANTERIOR APPROACH;  Surgeon: Christy Koyanagi, MD;  Location: [facility];  Service: Orthopedics;  Laterality: Left;   TUBAL LIGATION      Social History   Socioeconomic History   Marital status: Married    Spouse name: Christy Todd   Number of children: 2   Years of education: Not on file   Highest education level: Not on file  Occupational History   Not on file  Tobacco Use   Smoking status: Never   Smokeless tobacco: Never  Vaping Use   Vaping Use: Never used  Substance and Sexual Activity  Alcohol use: Not Currently   Drug use: Never   Sexual activity: Yes    Partners: Male  Other Topics Concern   Not on file  Social History Narrative   Not on file   Social Determinants of Health   Financial Resource Strain: Not on file  Food Insecurity: Not on file  Transportation Needs: Not on file  Physical Activity: Not on file  Stress: Not on file  Social Connections: Not on file    Family History  Problem Relation Age of Onset   Hypertension Father    Pancreatic cancer Father    Diabetes Maternal Grandmother    Colon cancer Neg Hx    Stomach cancer Neg Hx    Esophageal cancer Neg Hx     Health Maintenance  Topic Date Due   Hepatitis C Screening  02/14/2023 (Originally 07/10/1980)   MAMMOGRAM  01/22/2024 (Originally 07/10/2012)    COVID-19 Vaccine (5 - 2023-24 season) 02/07/2024 (Originally 07/03/2022)   DTaP/Tdap/Td (2 - Td or Tdap) 08/27/2030   COLONOSCOPY (Pts 45-37yrs Insurance coverage will need to be confirmed)  07/05/2031   INFLUENZA VACCINE  Completed   HIV Screening  Completed   Zoster Vaccines- Shingrix  Completed   HPV VACCINES  Aged Out   PAP SMEAR-Modifier  Discontinued     ----------------------------------------------------------------------------------------------------------------------------------------------------------------------------------------------------------------- Physical Exam BP 102/67   Pulse 69   Ht 5\' 6"  (1.676 m)   Wt 180 lb (81.6 kg)   LMP 11/02/1993   SpO2 99%   BMI 29.05 kg/m   Physical Exam Constitutional:      Appearance: Normal appearance.  HENT:     Head: Normocephalic and atraumatic.     Nose:     Comments: Maxillary and frontal sinus pain and tenderness. Eyes:     General: No scleral icterus. Cardiovascular:     Rate and Rhythm: Normal rate and regular rhythm.  Musculoskeletal:     Cervical back: Neck supple.  Neurological:     Mental Status: She is alert.  Psychiatric:        Mood and Affect: Mood normal.        Behavior: Behavior normal.     ------------------------------------------------------------------------------------------------------------------------------------------------------------------------------------------------------------------- Assessment and Plan  Abnormal weight gain She has put some weight back on over the past few months.  Increasing Wegovy back to 2.4 mg weekly.  Continue to work on dietary changes.  Degenerative joint disease (DJD) of hip Has had some improvement with use of Mirena leaf and turmeric.  No side effects from this.  She may continue this as needed.  Acute sinusitis She responded well to Augmentin in the past.  Adding course of Augmentin at home.  Encouraged adequate hydration.  She may want to consider  antihistamine as well.   Meds ordered this encounter  Medications   amoxicillin-clavulanate (AUGMENTIN) 875-125 MG tablet    Sig: Take 1 tablet by mouth 2 (two) times daily for 14 days.    Dispense:  28 tablet    Refill:  0   WEGOVY 2.4 MG/0.75ML SOAJ    Sig: Inject 2.4 mg into the skin once a week.    Dispense:  3 mL    Refill:  3    Return in about 6 months (around 07/25/2023) for F/u  Wegovy/Fasting labs.    This visit occurred during the SARS-CoV-2 public health emergency.  Safety protocols were in place, including screening questions prior to the visit, additional usage of staff PPE, and extensive cleaning of exam room while observing appropriate  contact time as indicated for disinfecting solutions.

## 2023-02-02 ENCOUNTER — Encounter: Payer: Self-pay | Admitting: Family Medicine

## 2023-02-11 ENCOUNTER — Ambulatory Visit: Payer: Managed Care, Other (non HMO)

## 2023-02-11 ENCOUNTER — Ambulatory Visit (INDEPENDENT_AMBULATORY_CARE_PROVIDER_SITE_OTHER): Payer: Managed Care, Other (non HMO) | Admitting: Podiatry

## 2023-02-11 ENCOUNTER — Telehealth: Payer: Self-pay

## 2023-02-11 ENCOUNTER — Encounter: Payer: Self-pay | Admitting: Podiatry

## 2023-02-11 DIAGNOSIS — M24575 Contracture, left foot: Secondary | ICD-10-CM | POA: Diagnosis not present

## 2023-02-11 DIAGNOSIS — R52 Pain, unspecified: Secondary | ICD-10-CM

## 2023-02-11 DIAGNOSIS — T8484XA Pain due to internal orthopedic prosthetic devices, implants and grafts, initial encounter: Secondary | ICD-10-CM

## 2023-02-11 DIAGNOSIS — M79672 Pain in left foot: Secondary | ICD-10-CM

## 2023-02-11 DIAGNOSIS — M7742 Metatarsalgia, left foot: Secondary | ICD-10-CM

## 2023-02-11 DIAGNOSIS — L905 Scar conditions and fibrosis of skin: Secondary | ICD-10-CM | POA: Diagnosis not present

## 2023-02-11 NOTE — Telephone Encounter (Addendum)
Initiated Prior authorization ZOX:[id number] 2.4MG /0.75ML auto-injectors Via: Covermymeds Case/Key:[id number] Status: denied as of 02/11/23 Reason: Your plan only covers this drug when you experience benefits from taking the drug and when your results from taking the drug are sent to [place]. Your doctor needs to send [place] all of the following: A) Your weight prior to starting weight loss drug therapy, B) Your weight now, and C) The dates your weights were taken. We have denied your request because we did not receive all of your results. We reviewed the information we had. Your request has been denied. Your doctor can send [place] any new or missing information for [place] to review. For this drug, you may have to meet other criteria. You can request the drug policy for more details. You can also request other plan documents for your review. Notified Pt via: Mychart  Unable to submit criteria insurance is request the pt to have lost at minimum 5% of their body weight pt has gain a total of 40 pounds  will submit with the  prior information.

## 2023-02-14 NOTE — Progress Notes (Signed)
  Subjective:  Patient ID: Christy Todd, female    DOB: Nov 30, 1960,  MRN: [id number]  Chief Complaint  Patient presents with   Foot Orthotics    New orthotics. Overall foot health. Transfer of care from [employer] [place] (Maura's mom) Had both hips replaced, lost 150lbs over the last 2 years    62 y.o. female presents with the above complaint. History confirmed with patient.  Most of the issues seem to be on the left foot, she has had previous multiple left foot surgery including bunionectomy, this has resulted in hallux varus positioning.  The bigger issue seems to be for her pain on the outside of the fifth toe where she had previous surgery and the toe sits up and rubs on shoes and against the fourth toe  Objective:  Physical Exam: warm, good capillary refill, no trophic changes or ulcerative lesions, normal DP and PT pulses, normal sensory exam, and left foot has reducible hallux varus deformity with good range of motion of the joint, minimal pain, cock-up fifth toe with contracture of the extensor tendon, painful scar lateral fifth metatarsal  Radiographs: Multiple views x-ray of the left foot: Hallux varus deformity, no retained hardware, previous partial resection of fifth metatarsal Assessment:   1. Contracture of joint of left foot   2. Metatarsalgia, left foot   3. Painful cutaneous scar      Plan:  Patient was evaluated and treated and all questions answered.  Reviewed her x-rays together as well as my clinical exam findings.  We discussed the presence of the hallux varus, the deformity here does not particularly have a large amount of pain is reducible and she is able to wear most of the shoes and activities without this bothering her.  More of the pain is on the outside of the foot which is painful in shoe gear, she has a thick scar here as well as extensive contracture of the fifth toe.  We discussed operative and nonoperative treatment.  She is interested in  operative correction due to the impact on activity pain and inability to find comfortable shoes.  Surgically we discussed fifth toe digital correction with possible extensor tendon lengthening versus skin plasty versus syndactylization of the fourth and fifth toes as well as fifth metatarsal head resection and excision of the cutaneous scar discussed the risk benefits and potential complications of this including but not limited to  pain, swelling, infection, scar, numbness which may be temporary or permanent, chronic pain, stiffness, nerve pain or damage, wound healing problems, bone healing problems including delayed or non-union.  We also discussed the possibility of transfer metatarsalgia to the fourth metatarsal.  We will hold off on casting new orthoses until postoperative period.  Informed consent signed and reviewed.   Surgical plan:  Procedure: -Left foot fifth metatarsal head resection, 4 5 syndactylization versus skin plasty and dorsal lengthening  Location: -[facility]  Anesthesia plan: -IV sedation with local anesthesia  Postoperative pain plan: - Tylenol 1000 mg every 6 hours, ibuprofen 600 mg every 6 hours, gabapentin 300 mg every 8 hours x5 days, oxycodone 5 mg 1-2 tabs every 6 hours only as needed  DVT prophylaxis: -None required  WB Restrictions / DME needs: -WBAT in CAM boot postop   No follow-ups on file.

## 2023-02-17 ENCOUNTER — Ambulatory Visit: Payer: Managed Care, Other (non HMO) | Admitting: Podiatry

## 2023-03-03 ENCOUNTER — Other Ambulatory Visit: Payer: Self-pay | Admitting: Family Medicine

## 2023-03-08 ENCOUNTER — Encounter: Payer: Self-pay | Admitting: Family Medicine

## 2023-03-11 ENCOUNTER — Telehealth: Payer: Self-pay

## 2023-03-11 NOTE — Telephone Encounter (Addendum)
Initiated Prior authorization UEA:[id number] 2.4MG /0.75ML auto-injectors Via: Covermymeds Case/Key:[id number] Status: approved  as of 03/11/23 Reason:Authorization Expiration Date: Mar 10, 2024 Notified Pt via: Clinical cytogeneticist

## 2023-03-14 ENCOUNTER — Emergency Department (HOSPITAL_BASED_OUTPATIENT_CLINIC_OR_DEPARTMENT_OTHER)
Admission: EM | Admit: 2023-03-14 | Discharge: 2023-03-14 | Disposition: A | Discharge status: Home / Self Care | Payer: Managed Care, Other (non HMO) | Attending: Emergency Medicine | Admitting: Emergency Medicine

## 2023-03-14 ENCOUNTER — Emergency Department (HOSPITAL_BASED_OUTPATIENT_CLINIC_OR_DEPARTMENT_OTHER): Payer: Managed Care, Other (non HMO)

## 2023-03-14 ENCOUNTER — Encounter (HOSPITAL_BASED_OUTPATIENT_CLINIC_OR_DEPARTMENT_OTHER): Payer: Self-pay | Admitting: Emergency Medicine

## 2023-03-14 ENCOUNTER — Other Ambulatory Visit: Payer: Self-pay

## 2023-03-14 DIAGNOSIS — I10 Essential (primary) hypertension: Secondary | ICD-10-CM | POA: Insufficient documentation

## 2023-03-14 DIAGNOSIS — K5792 Diverticulitis of intestine, part unspecified, without perforation or abscess without bleeding: Secondary | ICD-10-CM

## 2023-03-14 DIAGNOSIS — K5732 Diverticulitis of large intestine without perforation or abscess without bleeding: Secondary | ICD-10-CM | POA: Insufficient documentation

## 2023-03-14 DIAGNOSIS — J45909 Unspecified asthma, uncomplicated: Secondary | ICD-10-CM | POA: Diagnosis not present

## 2023-03-14 DIAGNOSIS — E039 Hypothyroidism, unspecified: Secondary | ICD-10-CM | POA: Insufficient documentation

## 2023-03-14 DIAGNOSIS — Z7982 Long term (current) use of aspirin: Secondary | ICD-10-CM | POA: Insufficient documentation

## 2023-03-14 DIAGNOSIS — Z79899 Other long term (current) drug therapy: Secondary | ICD-10-CM | POA: Diagnosis not present

## 2023-03-14 DIAGNOSIS — R1110 Vomiting, unspecified: Secondary | ICD-10-CM | POA: Diagnosis present

## 2023-03-14 HISTORY — DX: Activated protein C resistance: D68.51

## 2023-03-14 LAB — COMPREHENSIVE METABOLIC PANEL
ALT: 16 U/L (ref 0–44)
AST: 20 U/L (ref 15–41)
Albumin: 4.2 g/dL (ref 3.5–5.0)
Alkaline Phosphatase: 63 U/L (ref 38–126)
Anion gap: 7 (ref 5–15)
BUN: 33 mg/dL — ABNORMAL HIGH (ref 6–20)
CO2: 29 mmol/L (ref 22–32)
Calcium: 9.6 mg/dL (ref 8.9–10.3)
Chloride: 101 mmol/L (ref 98–111)
Creatinine, Ser: 0.87 mg/dL (ref 0.44–1.00)
GFR, Estimated: >60 mL/min (ref >60)
Glucose, Bld: 109 mg/dL — ABNORMAL HIGH (ref 70–99)
Potassium: 4.3 mmol/L (ref 3.5–5.1)
Sodium: 137 mmol/L (ref 135–145)
Total Bilirubin: 0.5 mg/dL (ref 0.3–1.2)
Total Protein: 7.1 g/dL (ref 6.5–8.1)

## 2023-03-14 LAB — URINALYSIS, ROUTINE W REFLEX MICROSCOPIC
Bacteria, UA: NONE SEEN
Bilirubin Urine: NEGATIVE
Glucose, UA: NEGATIVE mg/dL
Hgb urine dipstick: NEGATIVE
Ketones, ur: NEGATIVE mg/dL
Nitrite: NEGATIVE
Specific Gravity, Urine: 1.034 — ABNORMAL HIGH (ref 1.005–1.030)
pH: 5.5 (ref 5.0–8.0)

## 2023-03-14 LAB — CBC
HCT: 43.7 % (ref 36.0–46.0)
Hemoglobin: 14.3 g/dL (ref 12.0–15.0)
MCH: 27.7 pg (ref 26.0–34.0)
MCHC: 32.7 g/dL (ref 30.0–36.0)
MCV: 84.7 fL (ref 80.0–100.0)
Platelets: 341 10*3/uL (ref 150–400)
RBC: 5.16 MIL/uL — ABNORMAL HIGH (ref 3.87–5.11)
RDW: 13.1 % (ref 11.5–15.5)
WBC: 8.5 10*3/uL (ref 4.0–10.5)
nRBC: 0 % (ref 0.0–0.2)

## 2023-03-14 LAB — LIPASE, BLOOD: Lipase: 131 U/L — ABNORMAL HIGH (ref 11–51)

## 2023-03-14 MED ORDER — ONDANSETRON 8 MG PO TBDP: 8.0000 mg | ORAL_TABLET | Freq: Three times a day (TID) | ORAL | 0 refills | Status: DC | PRN

## 2023-03-14 MED ORDER — ONDANSETRON HCL 4 MG/2ML IJ SOLN
4.0000 mg | Freq: Once | INTRAMUSCULAR | Status: AC
  Administered 2023-03-14: 4 mg via INTRAVENOUS
  Filled 2023-03-14: qty 2

## 2023-03-14 MED ORDER — AMOXICILLIN-POT CLAVULANATE 875-125 MG PO TABS: 1.0000 | ORAL_TABLET | Freq: Two times a day (BID) | ORAL | 0 refills | Status: AC

## 2023-03-14 MED ORDER — SODIUM CHLORIDE 0.9 % IV SOLN
2.0000 g | Freq: Once | INTRAVENOUS | Status: AC
  Administered 2023-03-14: 2 g via INTRAVENOUS
  Filled 2023-03-14: qty 20

## 2023-03-14 MED ORDER — METRONIDAZOLE 500 MG PO TABS
500.0000 mg | ORAL_TABLET | Freq: Once | ORAL | Status: AC
  Administered 2023-03-14: 500 mg via ORAL
  Filled 2023-03-14: qty 1

## 2023-03-14 MED ORDER — OXYCODONE-ACETAMINOPHEN 5-325 MG PO TABS: 1.0000 | ORAL_TABLET | Freq: Four times a day (QID) | ORAL | 0 refills | Status: DC | PRN

## 2023-03-14 MED ORDER — IOHEXOL 300 MG/ML  SOLN
100.0000 mL | Freq: Once | INTRAMUSCULAR | Status: AC | PRN
  Administered 2023-03-14: 80 mL via INTRAVENOUS

## 2023-03-14 MED ORDER — MORPHINE SULFATE (PF) 4 MG/ML IV SOLN
4.0000 mg | Freq: Once | INTRAVENOUS | Status: AC
  Administered 2023-03-14: 4 mg via INTRAVENOUS
  Filled 2023-03-14: qty 1

## 2023-03-14 MED ORDER — KETOROLAC TROMETHAMINE 30 MG/ML IJ SOLN
15.0000 mg | Freq: Once | INTRAMUSCULAR | Status: AC
  Administered 2023-03-14: 15 mg via INTRAVENOUS
  Filled 2023-03-14: qty 1

## 2023-03-14 MED ORDER — SODIUM CHLORIDE 0.9 % IV BOLUS (SEPSIS)
1000.0000 mL | Freq: Once | INTRAVENOUS | Status: AC
  Administered 2023-03-14: 1000 mL via INTRAVENOUS

## 2023-03-14 MED ORDER — SODIUM CHLORIDE 0.9 % IV SOLN
1000.0000 mL | INTRAVENOUS | Status: DC
  Administered 2023-03-14: 1000 mL via INTRAVENOUS

## 2023-03-14 NOTE — Discharge Instructions (Addendum)
Take the antibiotics as prescribed.  Take the Zofran and oxycodone as needed for nausea and pain.  You can also supplement with over-the-counter ibuprofen for pain that is not as severe.  Follow-up with your doctor to be rechecked.  Return to the emergency room for fevers, severe pain, not being able to keep down your medications

## 2023-03-14 NOTE — ED Provider Notes (Signed)
[facility] EMERGENCY DEPARTMENT AT [facility] Provider Note   CSN: [id number] Arrival date & time: 03/14/23  1652     History   Chief complaint abdominal pain Christy Todd is a 62 y.o. female.  HPI   Patient has a history of hypertension hypothyroidism asthma, prediabetes, GERD, factor V Leiden mutation.  Patient has prior history of cholecystectomy, abdominal hysterectomy, cystocele and rectocele repair.  She is also had orthopedic surgeries.  Patient states she started having vomiting and diarrhea last weekend.  She thought it was just a GI bug.  Symptoms mostly resolved but over the last week she has had gradually worsening lower abdominal pain.  Patient states its on both the right and lower abdomen.  She has had decreased bowel movements.  The pain at times becomes very sharp and severe and was worse enough today that she came to the ED.  She denies any vomiting.  No fevers.  No dysuria.  Home Medications Prior to Admission medications   Medication Sig Start Date End Date Taking? Authorizing Provider  amoxicillin-clavulanate (AUGMENTIN) 875-125 MG tablet Take 1 tablet by mouth every 12 (twelve) hours for 10 days. 03/14/23 03/24/23 Yes Linwood Dibbles, MD  ondansetron (ZOFRAN-ODT) 8 MG disintegrating tablet Take 1 tablet (8 mg total) by mouth every 8 (eight) hours as needed for nausea or vomiting. 03/14/23  Yes Linwood Dibbles, MD  oxyCODONE-acetaminophen (PERCOCET/ROXICET) 5-325 MG tablet Take 1 tablet by mouth every 6 (six) hours as needed for severe pain. 03/14/23  Yes Linwood Dibbles, MD  albuterol (VENTOLIN HFA) 108 (90 Base) MCG/ACT inhaler Inhale 2 puffs into the lungs every 6 (six) hours as needed for wheezing or shortness of breath. 07/23/21   Waldon Merl, PA-C  amoxicillin (AMOXIL) 500 MG tablet Take 4 tablets by mouth 1 hour prior to dental procedure. 12/28/22   Tarry Kos, MD  aspirin EC 81 MG tablet Take 1 tablet (81 mg total) by mouth 2 (two) times daily. To be taken  after surgery to prevent blood clots Patient taking differently: Take 81 mg by mouth daily. To be taken after surgery to prevent blood clots 03/31/22 03/31/23  Cristie Hem, PA-C  Cholecalciferol (VITAMIN D3) 50 MCG (2000 UT) TABS Take 2,000 Units by mouth daily.    [provider]  DULoxetine (CYMBALTA) 30 MG capsule 2 BY MOUTH EVERY MORNING, 1 BY MOUTH EVERY AT NIGHT 03/03/23   Everrett Coombe, DO  levothyroxine (SYNTHROID) 125 MCG tablet TAKE 1 TABLET BY MOUTH EVERY DAY BEFORE BREAKFAST 07/27/22   Everrett Coombe, DO  lisinopril (ZESTRIL) 20 MG tablet Take 20 mg by mouth daily. 10/24/22   [provider]  Magnesium 200 MG TABS Take 200 mg by mouth daily.    [provider]  montelukast (SINGULAIR) 10 MG tablet TAKE 1 TABLET BY MOUTH EVERY DAY 12/28/22   Everrett Coombe, DO  NON FORMULARY Pt uses cpap nightly    [provider]  omeprazole (PRILOSEC) 20 MG capsule Take 20 mg by mouth every other day.    [provider]  spironolactone (ALDACTONE) 50 MG tablet TAKE 1 TABLET BY MOUTH EVERY DAY 12/23/22   Everrett Coombe, DO  WEGOVY 2.4 MG/0.75ML SOAJ Inject 2.4 mg into the skin once a week. 01/22/23   Everrett Coombe, DO      Allergies    Ciprofloxacin, Apple juice, Gluten meal, Benzoin, Claritin [loratadine], and Septra [sulfamethoxazole-trimethoprim]    Review of Systems   Review of Systems  Physical Exam Updated  Vital Signs BP (!) 148/84   Pulse 89   Temp 98 F (36.7 C) (Oral)   Resp 20   LMP 11/02/1993   SpO2 100%  Physical Exam Vitals and nursing note reviewed.  Constitutional:      Appearance: She is well-developed. She is not diaphoretic.  HENT:     Head: Normocephalic and atraumatic.     Right Ear: External ear normal.     Left Ear: External ear normal.  Eyes:     General: No scleral icterus.       Right eye: No discharge.        Left eye: No discharge.     Conjunctiva/sclera: Conjunctivae normal.  Neck:     Trachea: No tracheal  deviation.  Cardiovascular:     Rate and Rhythm: Normal rate and regular rhythm.  Pulmonary:     Effort: Pulmonary effort is normal. No respiratory distress.     Breath sounds: Normal breath sounds. No stridor. No wheezing or rales.  Abdominal:     General: Bowel sounds are normal. There is no distension.     Palpations: Abdomen is soft.     Tenderness: There is abdominal tenderness in the right lower quadrant, suprapubic area and left lower quadrant. There is no guarding or rebound.  Musculoskeletal:        General: No tenderness or deformity.     Cervical back: Neck supple.  Skin:    General: Skin is warm and dry.     Findings: No rash.  Neurological:     General: No focal deficit present.     Mental Status: She is alert.     Cranial Nerves: No cranial nerve deficit, dysarthria or facial asymmetry.     Sensory: No sensory deficit.     Motor: No abnormal muscle tone or seizure activity.     Coordination: Coordination normal.  Psychiatric:        Mood and Affect: Mood normal.     ED Results / Procedures / Treatments   Labs (all labs ordered are listed, but only abnormal results are displayed) Labs Reviewed  LIPASE, BLOOD - Abnormal; Notable for the following components:      Result Value   Lipase 131 (*)    All other components within normal limits  COMPREHENSIVE METABOLIC PANEL - Abnormal; Notable for the following components:   Glucose, Bld 109 (*)    BUN 33 (*)    All other components within normal limits  CBC - Abnormal; Notable for the following components:   RBC 5.16 (*)    All other components within normal limits  URINALYSIS, ROUTINE W REFLEX MICROSCOPIC - Abnormal; Notable for the following components:   Specific Gravity, Urine 1.034 (*)    Protein, ur TRACE (*)    Leukocytes,Ua MODERATE (*)    All other components within normal limits    EKG None  Radiology CT ABDOMEN PELVIS W CONTRAST  Result Date: 03/14/2023 CLINICAL DATA:  Left lower quadrant  abdominal pain EXAM: CT ABDOMEN AND PELVIS WITH CONTRAST TECHNIQUE: Multidetector CT imaging of the abdomen and pelvis was performed using the standard protocol following bolus administration of intravenous contrast. RADIATION DOSE REDUCTION: This exam was performed according to the departmental dose-optimization program which includes automated exposure control, adjustment of the mA and/or kV according to patient size and/or use of iterative reconstruction technique. CONTRAST:  80mL OMNIPAQUE IOHEXOL 300 MG/ML  SOLN COMPARISON:  None Available. FINDINGS: Lower chest: No acute abnormality. Hepatobiliary: No focal liver  abnormality is seen. Status post cholecystectomy. No biliary dilatation. Pancreas: Unremarkable. No pancreatic ductal dilatation or surrounding inflammatory changes. Spleen: Normal in size without focal abnormality. Adrenals/Urinary Tract: There is a 2.3 cm cyst in the right kidney. Otherwise, the kidneys, adrenal glands and bladder are within normal limits. Stomach/Bowel: There is focal wall thickening and inflammation of the proximal sigmoid colon. A few diverticula are seen in this region. There is no evidence for perforation or abscess. Evaluation is limited secondary to streak artifact from hip arthroplasties. There is no bowel obstruction. There is a large amount of stool throughout the entire colon. The stomach, small bowel and appendix are within normal limits. Vascular/Lymphatic: No significant vascular findings are present. No enlarged abdominal or pelvic lymph nodes. Reproductive: Status post hysterectomy. No adnexal masses. Other: No abdominal wall hernia or abnormality. No abdominopelvic ascites. Musculoskeletal: Bilateral hip arthroplasties are present. IMPRESSION: 1.  Acute uncomplicated sigmoid colon diverticulitis. 2.  Right renal cysts.  No follow-up recommended. Electronically Signed   By: Darliss Cheney M.D.   On: 03/14/2023 19:09    Procedures Procedures    Medications  Ordered in ED Medications  sodium chloride 0.9 % bolus 1,000 mL (0 mLs Intravenous Stopped 03/14/23 1927)    Followed by  0.9 %  sodium chloride infusion (has no administration in time range)  cefTRIAXone (ROCEPHIN) 2 g in sodium chloride 0.9 % 100 mL IVPB (has no administration in time range)  metroNIDAZOLE (FLAGYL) tablet 500 mg (has no administration in time range)  ondansetron (ZOFRAN) injection 4 mg (4 mg Intravenous Given 03/14/23 1743)  morphine (PF) 4 MG/ML injection 4 mg (4 mg Intravenous Given 03/14/23 1743)  iohexol (OMNIPAQUE) 300 MG/ML solution 100 mL (80 mLs Intravenous Contrast Given 03/14/23 1832)    ED Course/ Medical Decision Making/ A&P Clinical Course as of 03/14/23 1932  Sun Mar 14, 2023  1730 CBC normal [JK]  1915 CT scan consistent with diverticulitis. [JK]  1930 No significant abnormalities noted on the metabolic panel.  Lipase elevated but I doubt this is clinically significant.  Presentation is not consistent with diverticulitis.  Urinalysis does show white blood cells moderate leukocyte esterase.  This may be related to her diverticulitis [JK]    Clinical Course User Index [JK] Linwood Dibbles, MD                             Medical Decision Making Differential diagnosis includes but not limited to diverticulitis, constipation, bowel obstruction, pyelonephritis  Problems Addressed: Diverticulitis: acute illness or injury that poses a threat to life or bodily functions  Amount and/or Complexity of Data Reviewed Labs: ordered. Decision-making details documented in ED Course. Radiology: ordered and independent interpretation performed.  Risk Prescription drug management.   Patient presents with abdominal pain in the lower abdomen.  No significant laboratory abnormalities.  Urinalysis suggestive of possible infection although patient's not having any urinary symptoms.  Patient CT scan does show uncomplicated diverticulitis.  It is possible that is causing some  bladder irritation.  Patient is not having any vomiting.  She is not having fevers.  There is no evidence of abscess or perforation.  Considered inpatient treatment but patient appears stable and appropriate for outpatient treatment and close follow-up.  Patient is comfortable with outpatient management.  Will discharge home with Augmentin as she has a Cipro allergy.  Patient was given a dose of IV antibiotics in the ED.  Will discharge home with pain medications.Marland Kitchen  Warning signs and precautions discussed.        Final Clinical Impression(s) / ED Diagnoses Final diagnoses:  Diverticulitis    Rx / DC Orders ED Discharge Orders          Ordered    amoxicillin-clavulanate (AUGMENTIN) 875-125 MG tablet  Every 12 hours        03/14/23 1930    oxyCODONE-acetaminophen (PERCOCET/ROXICET) 5-325 MG tablet  Every 6 hours PRN        03/14/23 1930    ondansetron (ZOFRAN-ODT) 8 MG disintegrating tablet  Every 8 hours PRN        03/14/23 1930              Linwood Dibbles, MD 03/14/23 1932

## 2023-03-14 NOTE — ED Triage Notes (Signed)
Pelvic pain, intermittent . A week ago had gi bug.lasted 24 hours. 5 days ago pelvic pain started. She hasn't had bm for 5 days.

## 2023-03-15 ENCOUNTER — Telehealth: Payer: Self-pay | Admitting: General Practice

## 2023-03-15 NOTE — Transitions of Care (Post Inpatient/ED Visit) (Signed)
03/15/2023  Name: Christy Todd MRN: [id number] DOB: [birth year]  Today's TOC FU Call Status: Today's TOC FU Call Status:: Successful TOC FU Call Competed TOC FU Call Complete Date: 03/15/23  Transition Care Management Follow-up Telephone Call Date of Discharge: 03/14/23 Discharge Facility: [facility] ([facility]) Type of Discharge: Emergency Department Reason for ED Visit: Other: (diverticulitis) How have you been since you were released from the hospital?: Better Any questions or concerns?: No  Items Reviewed: Did you receive and understand the discharge instructions provided?: Yes Medications obtained,verified, and reconciled?: Yes (Medications Reviewed) Any new allergies since your discharge?: No Dietary orders reviewed?: Yes Type of Diet Ordered:: no specific diet ordered Do you have support at home?: Yes  Medications Reviewed Today: Medications Reviewed Today     Reviewed by Modesto Charon, RN (Registered Nurse) on 03/15/23 at 1025  Med List Status: <None>   Medication Order Taking? Sig Documenting Provider Last Dose Status Informant  albuterol (VENTOLIN HFA) 108 (90 Base) MCG/ACT inhaler [id number] No Inhale 2 puffs into the lungs every 6 (six) hours as needed for wheezing or shortness of breath. Waldon Merl, PA-C Taking Active Self  amoxicillin (AMOXIL) 500 MG tablet [id number] No Take 4 tablets by mouth 1 hour prior to dental procedure. Tarry Kos, MD Taking Active   amoxicillin-clavulanate (AUGMENTIN) 875-125 MG tablet [id number]  Take 1 tablet by mouth every 12 (twelve) hours for 10 days. Linwood Dibbles, MD  Active   aspirin EC 81 MG tablet [id number] No Take 1 tablet (81 mg total) by mouth 2 (two) times daily. To be taken after surgery to prevent blood clots  Patient taking differently: Take 81 mg by mouth daily. To be taken after surgery to prevent blood clots   Cristie Hem, PA-C Taking Active   Cholecalciferol (VITAMIN D3) 50 MCG (2000 UT) TABS [id number] No  Take 2,000 Units by mouth daily. [provider] Taking Active Self  DULoxetine (CYMBALTA) 30 MG capsule [id number]  2 BY MOUTH EVERY MORNING, 1 BY MOUTH EVERY AT Iverson Alamin, Selena Batten, DO  Active   levothyroxine (SYNTHROID) 125 MCG tablet [id number] No TAKE 1 TABLET BY MOUTH EVERY DAY BEFORE BREAKFAST Everrett Coombe, DO Taking Active   lisinopril (ZESTRIL) 20 MG tablet [id number]  Take 20 mg by mouth daily. [provider]  Active   Magnesium 200 MG TABS [id number] No Take 200 mg by mouth daily. [provider] Taking Active Self  montelukast (SINGULAIR) 10 MG tablet [id number] No TAKE 1 TABLET BY MOUTH EVERY DAY Everrett Coombe, DO Taking Active   NON FORMULARY [id number] No Pt uses cpap nightly [provider] Taking Active Self  omeprazole (PRILOSEC) 20 MG capsule [id number] No Take 20 mg by mouth every other day. [provider] Taking Active Self  ondansetron (ZOFRAN-ODT) 8 MG disintegrating tablet [id number]  Take 1 tablet (8 mg total) by mouth every 8 (eight) hours as needed for nausea or vomiting. Linwood Dibbles, MD  Active   oxyCODONE-acetaminophen (PERCOCET/ROXICET) 5-325 MG tablet [id number]  Take 1 tablet by mouth every 6 (six) hours as needed for severe pain. Linwood Dibbles, MD  Active   spironolactone (ALDACTONE) 50 MG tablet [id number] No TAKE 1 TABLET BY MOUTH EVERY DAY Everrett Coombe, DO Taking Active   WEGOVY 2.4 MG/0.75ML SOAJ [id number]  Inject 2.4 mg into the skin once a week. Everrett Coombe, DO  Active             Home Care and Equipment/Supplies: Were Home Health Services Ordered?: NA  Any new equipment or medical supplies ordered?: NA  Functional Questionnaire: Do you need assistance with bathing/showering or dressing?: No Do you need assistance with meal preparation?: No Do you need assistance with eating?: No Do you have difficulty maintaining continence: No Do you need assistance with getting out of bed/getting out of a chair/moving?:  No Do you have difficulty managing or taking your medications?: No  Follow up appointments reviewed: PCP Follow-up appointment confirmed?: Yes Date of PCP follow-up appointment?: 03/19/23 Follow-up Provider: Dr. Ashley Royalty Specialist [facility] Follow-up appointment confirmed?: NA Do you need transportation to your follow-up appointment?: No    SIGNATURE Modesto Charon, RN BSN

## 2023-03-19 ENCOUNTER — Encounter (HOSPITAL_COMMUNITY): Payer: Self-pay

## 2023-03-19 ENCOUNTER — Inpatient Hospital Stay (HOSPITAL_COMMUNITY)
Admission: EM | Admit: 2023-03-19 | Discharge: 2023-03-22 | DRG: 392 | Disposition: A | Discharge status: Home / Self Care | Payer: Managed Care, Other (non HMO) | Attending: Internal Medicine | Admitting: Internal Medicine

## 2023-03-19 ENCOUNTER — Ambulatory Visit (INDEPENDENT_AMBULATORY_CARE_PROVIDER_SITE_OTHER): Payer: Managed Care, Other (non HMO) | Admitting: Family Medicine

## 2023-03-19 ENCOUNTER — Other Ambulatory Visit: Payer: Self-pay

## 2023-03-19 ENCOUNTER — Ambulatory Visit (INDEPENDENT_AMBULATORY_CARE_PROVIDER_SITE_OTHER): Payer: Managed Care, Other (non HMO)

## 2023-03-19 VITALS — BP 135/89 | HR 72 | Ht 66.0 in | Wt 185.0 lb

## 2023-03-19 DIAGNOSIS — Z8249 Family history of ischemic heart disease and other diseases of the circulatory system: Secondary | ICD-10-CM

## 2023-03-19 DIAGNOSIS — K5792 Diverticulitis of intestine, part unspecified, without perforation or abscess without bleeding: Secondary | ICD-10-CM | POA: Diagnosis present

## 2023-03-19 DIAGNOSIS — E038 Other specified hypothyroidism: Secondary | ICD-10-CM | POA: Diagnosis not present

## 2023-03-19 DIAGNOSIS — E063 Autoimmune thyroiditis: Secondary | ICD-10-CM | POA: Diagnosis not present

## 2023-03-19 DIAGNOSIS — Z8616 Personal history of COVID-19: Secondary | ICD-10-CM | POA: Diagnosis not present

## 2023-03-19 DIAGNOSIS — K5900 Constipation, unspecified: Secondary | ICD-10-CM | POA: Diagnosis present

## 2023-03-19 DIAGNOSIS — K5732 Diverticulitis of large intestine without perforation or abscess without bleeding: Secondary | ICD-10-CM

## 2023-03-19 DIAGNOSIS — Z7982 Long term (current) use of aspirin: Secondary | ICD-10-CM

## 2023-03-19 DIAGNOSIS — D6851 Activated protein C resistance: Secondary | ICD-10-CM | POA: Diagnosis present

## 2023-03-19 DIAGNOSIS — Z888 Allergy status to other drugs, medicaments and biological substances status: Secondary | ICD-10-CM

## 2023-03-19 DIAGNOSIS — Z96643 Presence of artificial hip joint, bilateral: Secondary | ICD-10-CM | POA: Diagnosis present

## 2023-03-19 DIAGNOSIS — Z79890 Hormone replacement therapy (postmenopausal): Secondary | ICD-10-CM

## 2023-03-19 DIAGNOSIS — K529 Noninfective gastroenteritis and colitis, unspecified: Secondary | ICD-10-CM | POA: Diagnosis present

## 2023-03-19 DIAGNOSIS — Z882 Allergy status to sulfonamides status: Secondary | ICD-10-CM | POA: Diagnosis not present

## 2023-03-19 DIAGNOSIS — Z800 Family history of malignant neoplasm of digestive organs: Secondary | ICD-10-CM

## 2023-03-19 DIAGNOSIS — Z9109 Other allergy status, other than to drugs and biological substances: Secondary | ICD-10-CM | POA: Diagnosis not present

## 2023-03-19 DIAGNOSIS — Z833 Family history of diabetes mellitus: Secondary | ICD-10-CM

## 2023-03-19 DIAGNOSIS — Z90710 Acquired absence of both cervix and uterus: Secondary | ICD-10-CM | POA: Diagnosis not present

## 2023-03-19 DIAGNOSIS — J45909 Unspecified asthma, uncomplicated: Secondary | ICD-10-CM | POA: Diagnosis present

## 2023-03-19 DIAGNOSIS — Z79899 Other long term (current) drug therapy: Secondary | ICD-10-CM | POA: Diagnosis not present

## 2023-03-19 DIAGNOSIS — E039 Hypothyroidism, unspecified: Secondary | ICD-10-CM | POA: Diagnosis present

## 2023-03-19 DIAGNOSIS — G40909 Epilepsy, unspecified, not intractable, without status epilepticus: Secondary | ICD-10-CM | POA: Diagnosis present

## 2023-03-19 DIAGNOSIS — Z974 Presence of external hearing-aid: Secondary | ICD-10-CM

## 2023-03-19 DIAGNOSIS — K219 Gastro-esophageal reflux disease without esophagitis: Secondary | ICD-10-CM | POA: Diagnosis present

## 2023-03-19 DIAGNOSIS — G4733 Obstructive sleep apnea (adult) (pediatric): Secondary | ICD-10-CM | POA: Diagnosis present

## 2023-03-19 DIAGNOSIS — I10 Essential (primary) hypertension: Secondary | ICD-10-CM | POA: Diagnosis present

## 2023-03-19 LAB — COMPREHENSIVE METABOLIC PANEL
ALT: 60 U/L — ABNORMAL HIGH (ref 0–44)
AST: 38 U/L (ref 15–41)
Albumin: 3.5 g/dL (ref 3.5–5.0)
Alkaline Phosphatase: 76 U/L (ref 38–126)
Anion gap: 7 (ref 5–15)
BUN: 14 mg/dL (ref 6–20)
CO2: 29 mmol/L (ref 22–32)
Calcium: 8.9 mg/dL (ref 8.9–10.3)
Chloride: 100 mmol/L (ref 98–111)
Creatinine, Ser: 0.81 mg/dL (ref 0.44–1.00)
GFR, Estimated: >60 mL/min (ref >60)
Glucose, Bld: 144 mg/dL — ABNORMAL HIGH (ref 70–99)
Potassium: 3.8 mmol/L (ref 3.5–5.1)
Sodium: 136 mmol/L (ref 135–145)
Total Bilirubin: 0.4 mg/dL (ref 0.3–1.2)
Total Protein: 6.6 g/dL (ref 6.5–8.1)

## 2023-03-19 LAB — CBC
HCT: 44 % (ref 36.0–46.0)
Hemoglobin: 13.9 g/dL (ref 12.0–15.0)
MCH: 27.4 pg (ref 26.0–34.0)
MCHC: 31.6 g/dL (ref 30.0–36.0)
MCV: 86.8 fL (ref 80.0–100.0)
Platelets: 312 10*3/uL (ref 150–400)
RBC: 5.07 MIL/uL (ref 3.87–5.11)
RDW: 13.2 % (ref 11.5–15.5)
WBC: 8.1 10*3/uL (ref 4.0–10.5)
nRBC: 0 % (ref 0.0–0.2)

## 2023-03-19 LAB — LIPASE, BLOOD: Lipase: 44 U/L (ref 11–51)

## 2023-03-19 LAB — HIV ANTIBODY (ROUTINE TESTING W REFLEX): HIV Screen 4th Generation wRfx: NONREACTIVE

## 2023-03-19 LAB — TSH: TSH: 7.196 u[IU]/mL — ABNORMAL HIGH (ref 0.350–4.500)

## 2023-03-19 MED ORDER — IOHEXOL 300 MG/ML  SOLN
100.0000 mL | Freq: Once | INTRAMUSCULAR | Status: AC | PRN
  Administered 2023-03-19: 100 mL via INTRAVENOUS

## 2023-03-19 MED ORDER — TRAZODONE HCL 50 MG PO TABS: 25.0000 mg | ORAL_TABLET | Freq: Every evening | ORAL | Status: DC | PRN

## 2023-03-19 MED ORDER — SODIUM CHLORIDE 0.9 % IV SOLN
1000.0000 mL | INTRAVENOUS | Status: DC
  Administered 2023-03-19 – 2023-03-22 (×5): 1000 mL via INTRAVENOUS

## 2023-03-19 MED ORDER — ACETAMINOPHEN 650 MG RE SUPP: 650.0000 mg | Freq: Four times a day (QID) | RECTAL | Status: DC | PRN

## 2023-03-19 MED ORDER — ACETAMINOPHEN 325 MG PO TABS
650.0000 mg | ORAL_TABLET | Freq: Four times a day (QID) | ORAL | Status: DC | PRN
  Administered 2023-03-19 – 2023-03-20 (×2): 650 mg via ORAL
  Filled 2023-03-19 (×2): qty 2

## 2023-03-19 MED ORDER — ENOXAPARIN SODIUM 40 MG/0.4ML IJ SOSY
40.0000 mg | PREFILLED_SYRINGE | INTRAMUSCULAR | Status: DC
  Administered 2023-03-19 – 2023-03-21 (×3): 40 mg via SUBCUTANEOUS
  Filled 2023-03-19 (×3): qty 0.4

## 2023-03-19 MED ORDER — SORBITOL 70 % SOLN
30.0000 mL | Freq: Three times a day (TID) | Status: DC
  Administered 2023-03-19 – 2023-03-20 (×2): 30 mL via ORAL
  Filled 2023-03-19 (×4): qty 30

## 2023-03-19 MED ORDER — ONDANSETRON 4 MG PO TBDP
4.0000 mg | ORAL_TABLET | Freq: Once | ORAL | Status: AC | PRN
  Administered 2023-03-19: 4 mg via ORAL

## 2023-03-19 MED ORDER — METRONIDAZOLE 500 MG/100ML IV SOLN
500.0000 mg | Freq: Two times a day (BID) | INTRAVENOUS | Status: DC
  Administered 2023-03-20 – 2023-03-22 (×5): 500 mg via INTRAVENOUS
  Filled 2023-03-19 (×5): qty 100

## 2023-03-19 MED ORDER — SODIUM CHLORIDE 0.9 % IV BOLUS (SEPSIS)
500.0000 mL | Freq: Once | INTRAVENOUS | Status: AC
  Administered 2023-03-19: 500 mL via INTRAVENOUS

## 2023-03-19 MED ORDER — ALBUTEROL SULFATE (2.5 MG/3ML) 0.083% IN NEBU: 2.5000 mg | INHALATION_SOLUTION | Freq: Four times a day (QID) | RESPIRATORY_TRACT | Status: DC | PRN

## 2023-03-19 MED ORDER — SODIUM CHLORIDE 0.9 % IV SOLN
2.0000 g | Freq: Once | INTRAVENOUS | Status: AC
  Administered 2023-03-19: 2 g via INTRAVENOUS
  Filled 2023-03-19: qty 12.5

## 2023-03-19 MED ORDER — DULOXETINE HCL 30 MG PO CPEP
30.0000 mg | ORAL_CAPSULE | Freq: Every day | ORAL | Status: DC
  Administered 2023-03-19 – 2023-03-21 (×3): 30 mg via ORAL
  Filled 2023-03-19 (×3): qty 1

## 2023-03-19 MED ORDER — SODIUM CHLORIDE 0.45 % IV SOLN: INTRAVENOUS | Status: AC

## 2023-03-19 MED ORDER — SODIUM CHLORIDE 0.9 % IV SOLN
2.0000 g | Freq: Three times a day (TID) | INTRAVENOUS | Status: DC
  Administered 2023-03-20 – 2023-03-21 (×7): 2 g via INTRAVENOUS
  Filled 2023-03-19 (×8): qty 12.5

## 2023-03-19 MED ORDER — ONDANSETRON HCL 4 MG/2ML IJ SOLN
4.0000 mg | Freq: Once | INTRAMUSCULAR | Status: AC
  Administered 2023-03-19: 4 mg via INTRAVENOUS
  Filled 2023-03-19: qty 2

## 2023-03-19 MED ORDER — ASPIRIN 81 MG PO TBEC
81.0000 mg | DELAYED_RELEASE_TABLET | Freq: Every day | ORAL | Status: DC
  Administered 2023-03-20 – 2023-03-22 (×3): 81 mg via ORAL
  Filled 2023-03-19 (×3): qty 1

## 2023-03-19 MED ORDER — OXYCODONE-ACETAMINOPHEN 5-325 MG PO TABS: 1.0000 | ORAL_TABLET | Freq: Four times a day (QID) | ORAL | Status: DC | PRN

## 2023-03-19 MED ORDER — ONDANSETRON 4 MG PO TBDP
8.0000 mg | ORAL_TABLET | Freq: Three times a day (TID) | ORAL | Status: DC | PRN
  Administered 2023-03-20 – 2023-03-22 (×4): 8 mg via ORAL
  Filled 2023-03-19 (×5): qty 2

## 2023-03-19 MED ORDER — ONDANSETRON 4 MG PO TBDP
ORAL_TABLET | ORAL | Status: AC
  Filled 2023-03-19: qty 1

## 2023-03-19 MED ORDER — MONTELUKAST SODIUM 10 MG PO TABS
10.0000 mg | ORAL_TABLET | Freq: Every day | ORAL | Status: DC
  Administered 2023-03-19 – 2023-03-22 (×4): 10 mg via ORAL
  Filled 2023-03-19 (×4): qty 1

## 2023-03-19 MED ORDER — MORPHINE SULFATE (PF) 4 MG/ML IV SOLN
4.0000 mg | Freq: Once | INTRAVENOUS | Status: AC
  Administered 2023-03-19: 4 mg via INTRAVENOUS
  Filled 2023-03-19: qty 1

## 2023-03-19 MED ORDER — KETOROLAC TROMETHAMINE 30 MG/ML IJ SOLN
30.0000 mg | Freq: Four times a day (QID) | INTRAMUSCULAR | Status: DC | PRN
  Administered 2023-03-19 – 2023-03-21 (×4): 30 mg via INTRAVENOUS
  Filled 2023-03-19 (×6): qty 1

## 2023-03-19 MED ORDER — LEVOTHYROXINE SODIUM 25 MCG PO TABS
125.0000 ug | ORAL_TABLET | Freq: Every day | ORAL | Status: DC
  Administered 2023-03-20 – 2023-03-22 (×3): 125 ug via ORAL
  Filled 2023-03-19 (×3): qty 1

## 2023-03-19 MED ORDER — METRONIDAZOLE 500 MG/100ML IV SOLN
500.0000 mg | Freq: Once | INTRAVENOUS | Status: AC
  Administered 2023-03-19: 500 mg via INTRAVENOUS
  Filled 2023-03-19: qty 100

## 2023-03-19 MED ORDER — SPIRONOLACTONE 25 MG PO TABS
50.0000 mg | ORAL_TABLET | Freq: Every day | ORAL | Status: DC
  Administered 2023-03-19 – 2023-03-22 (×4): 50 mg via ORAL
  Filled 2023-03-19 (×2): qty 2
  Filled 2023-03-19: qty 4
  Filled 2023-03-19 (×2): qty 2

## 2023-03-19 MED ORDER — PANTOPRAZOLE SODIUM 40 MG PO TBEC
40.0000 mg | DELAYED_RELEASE_TABLET | Freq: Every day | ORAL | Status: DC
  Administered 2023-03-19 – 2023-03-22 (×4): 40 mg via ORAL
  Filled 2023-03-19 (×4): qty 1

## 2023-03-19 NOTE — Assessment & Plan Note (Addendum)
Continued worsening pain, now with some distention as well.  Complication suspected.  Will repeat CT scan of the abdomen/pelvis STAT.  Options are limited for outpatient therapy due to her allergies and I explained to her that we may need to have her return for to the ED for further treatment if there is evidence of complication or bowel obstruction.

## 2023-03-19 NOTE — Subjective & Objective (Signed)
Christy Todd, a 62 y/o with h/o HTN, hypothyroidism, Sz disorder, OSA was seen in [facility] 03/14/23 and diagnosed with diverticulitis. She was sent home on augmentin. In the interval she continues to feel ill, has abdominal pain and nausea. She presents to [facility] for re-evaluation

## 2023-03-19 NOTE — Assessment & Plan Note (Signed)
Lat TSH 1 year ago 0.58  Plan TSH  Continue home dose synthroid

## 2023-03-19 NOTE — ED Notes (Signed)
ED TO INPATIENT HANDOFF REPORT  ED Nurse Name and Phone #: Britt Boozer [phone]  S Name/Age/Gender Christy Todd 62 y.o. female Room/Bed: 002C/002C  Code Status   Code Status: Full Code  Home/SNF/Other Home Patient oriented to: self, place, time, and situation Is this baseline? Yes   Triage Complete: Triage complete  Chief Complaint Diverticulitis large intestine w/o perforation or abscess w/o bleeding [K57.32]  Triage Note Pt reports being dx with diverticulitis on 5/12, given abx, and had follow up CT today which showed worsening instead of improvement. Endorses nausea without vomiting. No BM in three weeks.    Allergies Allergies  Allergen Reactions   Ciprofloxacin Other (See Comments)    Detatched muscles and tendons    Apple Juice     ALL form of apple  Chemical burn to mouth and gums   Gluten Meal Other (See Comments)    Gut Distress   Benzoin    Claritin [Loratadine] Other (See Comments)    allergic psychosis   Septra [Sulfamethoxazole-Trimethoprim] Rash    Level of Care/Admitting Diagnosis ED Disposition     ED Disposition  Admit   Condition  --   Comment  Hospital [facility]: [facility] [[id number]]  Level of Care: Med-Surg [16]  May admit patient to [facility] or [facility] if equivalent level of care is available:: No  Covid Evaluation: Asymptomatic - no recent exposure (last 10 days) testing not required  Diagnosis: Diverticulitis large intestine w/o perforation or abscess w/o bleeding [811914]  Admitting Physician: Jacques Navy [[id number]]  Attending Physician: Jacques Navy [[id number]]  Certification:: I certify this patient will need inpatient services for at least 2 midnights  Estimated Length of Stay: 4          B Medical/Surgery History Past Medical History:  Diagnosis Date   Arthritis    Asthma    COVID-19 07/23/2021   Depression    pt states she does not have depression. Cymbalta is used for pain control   Factor 5 Leiden  mutation, heterozygous (HCC)    GERD (gastroesophageal reflux disease)    Hypertension    Hypothyroid    Pre-diabetes    pt states she is no longer pre-diabetic due to weight loss   Sleep apnea    on CPAP   Wears hearing aid in right ear    Past Surgical History:  Procedure Laterality Date   ABDOMINAL HYSTERECTOMY     ANKLE RECONSTRUCTION     CHOLECYSTECTOMY     COLONOSCOPY  12/09/2018   [facility]. [facility].  3 mm sessile polyp, hepatic flexure. Mild - Moderate diverticulosis, sigmoid colon.   CYSTOCELE REPAIR     ESOPHAGOGASTRODUODENOSCOPY  12/09/2018   Dr. [facility]. Normal upper GI tract   RECTOCELE REPAIR     TENDON REPAIR     TOTAL ABDOMINAL HYSTERECTOMY     TOTAL HIP ARTHROPLASTY Right 08/25/2021   Procedure: RIGHT TOTAL HIP ARTHROPLASTY ANTERIOR APPROACH;  Surgeon: Tarry Kos, MD;  Location: [facility] OR;  Service: Orthopedics;  Laterality: Right;  3-C   TOTAL HIP ARTHROPLASTY Left 04/06/2022   Procedure: LEFT TOTAL HIP ARTHROPLASTY ANTERIOR APPROACH;  Surgeon: Tarry Kos, MD;  Location: [facility];  Service: Orthopedics;  Laterality: Left;   TUBAL LIGATION       A IV Location/Drains/Wounds Patient Lines/Drains/Airways Status     Active Line/Drains/Airways     Name Placement date Placement time Site Days   Peripheral IV  03/19/23 20 G Anterior;Left Forearm 03/19/23  1714  Forearm  less than 1            Intake/Output Last 24 hours  Intake/Output Summary (Last 24 hours) at 03/19/2023 1923 Last data filed at 03/19/2023 1901 Gross per 24 hour  Intake 763.97 ml  Output --  Net 763.97 ml    Labs/Imaging Results for orders placed or performed during the hospital encounter of 03/19/23 (from the past 48 hour(s))  Lipase, blood     Status: None   Collection Time: 03/19/23  3:12 PM  Result Value Ref Range   Lipase 44 11 - 51 U/L    Comment: Performed at [facility], [address] [place],  [place] [address]  Comprehensive metabolic panel     Status: Abnormal   Collection Time: 03/19/23  3:12 PM  Result Value Ref Range   Sodium 136 135 - 145 mmol/L   Potassium 3.8 3.5 - 5.1 mmol/L   Chloride 100 98 - 111 mmol/L   CO2 29 22 - 32 mmol/L   Glucose, Bld 144 (H) 70 - 99 mg/dL    Comment: Glucose reference range applies only to samples taken after fasting for at least 8 hours.   BUN 14 6 - 20 mg/dL   Creatinine, Ser 7.84 0.44 - 1.00 mg/dL   Calcium 8.9 8.9 - 69.6 mg/dL   Total Protein 6.6 6.5 - 8.1 g/dL   Albumin 3.5 3.5 - 5.0 g/dL   AST 38 15 - 41 U/L   ALT 60 (H) 0 - 44 U/L   Alkaline Phosphatase 76 38 - 126 U/L   Total Bilirubin 0.4 0.3 - 1.2 mg/dL   GFR, Estimated >29 >52 mL/min    Comment: (NOTE) Calculated using the CKD-EPI Creatinine Equation (2021)    Anion gap 7 5 - 15    Comment: Performed at [facility], [address], [place], [place] [address]  CBC     Status: None   Collection Time: 03/19/23  3:12 PM  Result Value Ref Range   WBC 8.1 4.0 - 10.5 K/uL   RBC 5.07 3.87 - 5.11 MIL/uL   Hemoglobin 13.9 12.0 - 15.0 g/dL   HCT 44.0 10.2 - 72.5 %   MCV 86.8 80.0 - 100.0 fL   MCH 27.4 26.0 - 34.0 pg   MCHC 31.6 30.0 - 36.0 g/dL   RDW 36.6 44.0 - 34.7 %   Platelets 312 150 - 400 K/uL   nRBC 0.0 0.0 - 0.2 %    Comment: Performed at [facility], [address], [place], [place] [address]   CT Abdomen Pelvis W Contrast  Result Date: 03/19/2023 CLINICAL DATA:  Left lower quadrant pain. EXAM: CT ABDOMEN AND PELVIS WITH CONTRAST TECHNIQUE: Multidetector CT imaging of the abdomen and pelvis was performed using the standard protocol following bolus administration of intravenous contrast. RADIATION DOSE REDUCTION: This exam was performed according to the departmental dose-optimization program which includes automated exposure control, adjustment of the mA and/or kV according to patient size and/or use of iterative reconstruction technique. CONTRAST:  OMNIPAQUE  IOHEXOL 300 MG/ML  SOLN COMPARISON:  CT abdomen pelvis 03/14/2023 FINDINGS: Lower chest: Normal heart size. Dependent atelectasis lung bases bilaterally. No pleural effusion. Hepatobiliary: Liver is normal in size and contour. No focal hepatic lesion. Prior cholecystectomy. Pancreas: Unremarkable Spleen: Unremarkable Adrenals/Urinary Tract: Normal adrenal glands. Kidneys enhance symmetrically with contrast. No hydronephrosis. There is a 2.3 cm exophytic simple cyst  midpole right kidney. No imaging follow-up needed. Stomach/Bowel: There is circumferential wall thickening of the descending and sigmoid colon with small amount of pericolonic fluid particularly within the left pericolic gutter, which has progressed when compared to recent prior CT. There is a longer segment colonic thickening which starts in the proximal descending colon, increased from prior. Sigmoid colonic diverticulosis. Stool throughout the colon. No evidence for small bowel obstruction. No free intraperitoneal air. Small hiatal hernia. Normal morphology of the stomach. Vascular/Lymphatic: Normal caliber abdominal aorta. No retroperitoneal lymphadenopathy. Reproductive: Prior hysterectomy.  No pelvic masses. Other: None. Musculoskeletal: Lower thoracic and lumbar spine degenerative changes. Bilateral hip arthroplasties. IMPRESSION: 1. When compared to recent prior CT there has been increased circumferential wall thickening of the descending and sigmoid colon. The wall thickening starts more proximally near the splenic flexure. There is increased pericolonic fat stranding and fluid within the left pericolic gutter. Findings may represent worsening diverticulitis or colitis. After resolution of the acute symptomatology, if the patient has not had recent colonoscopy, this is recommended. Electronically Signed   By: Annia Belt M.D.   On: 03/19/2023 12:21    Pending Labs Unresulted Labs (From admission, onward)     Start     Ordered   03/26/23  0500  Creatinine, serum  (enoxaparin (LOVENOX)    CrCl >/= 30 ml/min)  Weekly,   R     Comments: while on enoxaparin therapy    03/19/23 1905   03/20/23 0500  Basic metabolic panel  Tomorrow morning,   R        03/19/23 1905   03/19/23 1904  TSH  Add-on,   AD        03/19/23 1905   03/19/23 1903  HIV Antibody (routine testing w rflx)  (HIV Antibody (Routine testing w reflex) panel)  Add-on,   AD        03/19/23 1905            Vitals/Pain Today's Vitals   03/19/23 1743 03/19/23 1759 03/19/23 1815 03/19/23 1851  BP:   (!) 154/81   Pulse:   84   Resp:   18   Temp:    97.9 F (36.6 C)  TempSrc:      SpO2:   98%   Weight: 83.9 kg     Height: 5\' 6"  (1.676 m)     PainSc:  4       Isolation Precautions No active isolations  Medications Medications  sodium chloride 0.9 % bolus 500 mL (0 mLs Intravenous Stopped 03/19/23 1755)    Followed by  0.9 %  sodium chloride infusion (1,000 mLs Intravenous New Bag/Given 03/19/23 1755)  aspirin EC tablet 81 mg (has no administration in time range)  spironolactone (ALDACTONE) tablet 50 mg (has no administration in time range)  DULoxetine (CYMBALTA) DR capsule 30 mg (has no administration in time range)  levothyroxine (SYNTHROID) tablet 125 mcg (has no administration in time range)  pantoprazole (PROTONIX) EC tablet 40 mg (has no administration in time range)  ondansetron (ZOFRAN-ODT) disintegrating tablet 8 mg (has no administration in time range)  albuterol (PROVENTIL) (2.5 MG/3ML) 0.083% nebulizer solution 2.5 mg (has no administration in time range)  montelukast (SINGULAIR) tablet 10 mg (has no administration in time range)  enoxaparin (LOVENOX) injection 40 mg (has no administration in time range)  0.45 % sodium chloride infusion (has no administration in time range)  acetaminophen (TYLENOL) tablet 650 mg (has no administration in time range)    Or  acetaminophen (TYLENOL) suppository 650 mg (has no administration in time range)   ketorolac (TORADOL) 30 MG/ML injection 30 mg (has no administration in time range)  traZODone (DESYREL) tablet 25 mg (has no administration in time range)  sorbitol 70 % solution 30 mL (has no administration in time range)  ceFEPIme (MAXIPIME) 2 g in sodium chloride 0.9 % 100 mL IVPB (has no administration in time range)    And  metroNIDAZOLE (FLAGYL) IVPB 500 mg (has no administration in time range)  ondansetron (ZOFRAN-ODT) disintegrating tablet 4 mg (4 mg Oral Given 03/19/23 1511)  ceFEPIme (MAXIPIME) 2 g in sodium chloride 0.9 % 100 mL IVPB (0 g Intravenous Stopped 03/19/23 1755)    And  metroNIDAZOLE (FLAGYL) IVPB 500 mg (0 mg Intravenous Stopped 03/19/23 1901)  morphine (PF) 4 MG/ML injection 4 mg (4 mg Intravenous Given 03/19/23 1719)  ondansetron (ZOFRAN) injection 4 mg (4 mg Intravenous Given 03/19/23 1719)    Mobility walks     R Recommendations: See Admitting Provider Note  Report given to:   Additional Notes: failed outpt treatment, A&Ox4, was worsening pain, received IV abx in ED

## 2023-03-19 NOTE — Assessment & Plan Note (Signed)
Patient reports no BM x 18 days. She has tried dulcolax w/o success. BS hypoactive.  Plan Sorbitol 70% 30 cc q8 x 3

## 2023-03-19 NOTE — Assessment & Plan Note (Addendum)
Patient failed outpatient treatment with augmentin. CT reveals worsening diverticulitis with increased bowel thickening and stranding w/o perforation. On exam - absent BS left side, decreased BS right, marked tenderness LUQ, less pain LLQ, no guarding, positive for rebound tenderness.  Plan Med-surg admit for IV abx - cefepime and flagyl

## 2023-03-19 NOTE — Assessment & Plan Note (Signed)
Chronic problem.  Plan Respiratory consult for CPAP HS

## 2023-03-19 NOTE — ED Triage Notes (Signed)
Pt reports being dx with diverticulitis on 5/12, given abx, and had follow up CT today which showed worsening instead of improvement. Endorses nausea without vomiting. No BM in three weeks.

## 2023-03-19 NOTE — H&P (Signed)
History and Physical    Christy Todd ZOX:[id number] DOB: October 26, 1961 DOA: 03/19/2023  DOS: the patient was seen and examined on 03/19/2023  PCP: Everrett Coombe, DO   Patient coming from: Home  I have personally briefly reviewed patient's old medical records in [facility] Link  Christy Todd, a 63 y/o with h/o HTN, hypothyroidism, Sz disorder, OSA was seen in [facility] 03/14/23 and diagnosed with diverticulitis. She was sent home on augmentin. In the interval she continues to feel ill, has abdominal pain and nausea. She presents to [facility] for re-evaluation   ED Course: afebrile, 154/81  84  18. Per EDP patient in no acute distress. Abdomen tender to palpation. Lab: glucose 144, WBC 8, Hgb 13.9  CT abd/pelvis - increased bowel wall thickening descending and sigmoid colon with increased stranding  Review of Systems:  Review of Systems  Constitutional:  Negative for chills, fever and weight loss.  HENT: Negative.    Eyes: Negative.   Respiratory: Negative.    Cardiovascular: Negative.   Gastrointestinal:  Positive for abdominal pain, constipation and nausea. Negative for vomiting.  Genitourinary: Negative.   Skin: Negative.   Neurological: Negative.   Endo/Heme/Allergies: Negative.   Psychiatric/Behavioral: Negative.      Past Medical History:  Diagnosis Date   Arthritis    Asthma    COVID-19 07/23/2021   Depression    pt states she does not have depression. Cymbalta is used for pain control   Factor 5 Leiden mutation, heterozygous (HCC)    GERD (gastroesophageal reflux disease)    Hypertension    Hypothyroid    Pre-diabetes    pt states she is no longer pre-diabetic due to weight loss   Sleep apnea    on CPAP   Wears hearing aid in right ear     Past Surgical History:  Procedure Laterality Date   ABDOMINAL HYSTERECTOMY     ANKLE RECONSTRUCTION     CHOLECYSTECTOMY     COLONOSCOPY  12/09/2018   Dr. [facility]  3 mm sessile polyp, hepatic  flexure. Mild - Moderate diverticulosis, sigmoid colon.   CYSTOCELE REPAIR     ESOPHAGOGASTRODUODENOSCOPY  12/09/2018   Dr. [facility]. Normal upper GI tract   RECTOCELE REPAIR     TENDON REPAIR     TOTAL ABDOMINAL HYSTERECTOMY     TOTAL HIP ARTHROPLASTY Right 08/25/2021   Procedure: RIGHT TOTAL HIP ARTHROPLASTY ANTERIOR APPROACH;  Surgeon: Tarry Kos, MD;  Location: [facility];  Service: Orthopedics;  Laterality: Right;  3-C   TOTAL HIP ARTHROPLASTY Left 04/06/2022   Procedure: LEFT TOTAL HIP ARTHROPLASTY ANTERIOR APPROACH;  Surgeon: Tarry Kos, MD;  Location: [facility];  Service: Orthopedics;  Laterality: Left;   TUBAL LIGATION      Soc Hx - married 40 years (together 44 years) , 2 children. Lives with spouse.    reports that she has never smoked. She has never used smokeless tobacco. She reports that she does not currently use alcohol. She reports that she does not use drugs.  Allergies  Allergen Reactions   Ciprofloxacin Other (See Comments)    Detatched muscles and tendons    Apple Juice     ALL form of apple  Chemical burn to mouth and gums   Gluten Meal Other (See Comments)    Gut Distress   Benzoin    Claritin [Loratadine] Other (See Comments)    allergic psychosis   Septra [Sulfamethoxazole-Trimethoprim] Rash  Family History  Problem Relation Age of Onset   Hypertension Father    Pancreatic cancer Father    Diabetes Maternal Grandmother    Colon cancer Neg Hx    Stomach cancer Neg Hx    Esophageal cancer Neg Hx     Prior to Admission medications   Medication Sig Start Date End Date Taking? Authorizing Provider  albuterol (VENTOLIN HFA) 108 (90 Base) MCG/ACT inhaler Inhale 2 puffs into the lungs every 6 (six) hours as needed for wheezing or shortness of breath. 07/23/21   Waldon Merl, PA-C  amoxicillin-clavulanate (AUGMENTIN) 875-125 MG tablet Take 1 tablet by mouth every 12 (twelve) hours for 10 days. 03/14/23 03/24/23  Linwood Dibbles, MD  aspirin EC 81 MG tablet Take 1 tablet (81 mg total) by mouth 2 (two) times daily. To be taken after surgery to prevent blood clots Patient taking differently: Take 81 mg by mouth daily. To be taken after surgery to prevent blood clots 03/31/22 03/31/23  Cristie Hem, PA-C  Cholecalciferol (VITAMIN D3) 50 MCG (2000 UT) TABS Take 2,000 Units by mouth daily.    [provider]  DULoxetine (CYMBALTA) 30 MG capsule 2 BY MOUTH EVERY MORNING, 1 BY MOUTH EVERY AT NIGHT 03/03/23   Everrett Coombe, DO  levothyroxine (SYNTHROID) 125 MCG tablet TAKE 1 TABLET BY MOUTH EVERY DAY BEFORE BREAKFAST 07/27/22   Everrett Coombe, DO  Magnesium 200 MG TABS Take 200 mg by mouth daily.    [provider]  montelukast (SINGULAIR) 10 MG tablet TAKE 1 TABLET BY MOUTH EVERY DAY 12/28/22   Everrett Coombe, DO  NON FORMULARY Pt uses cpap nightly    [provider]  omeprazole (PRILOSEC) 20 MG capsule Take 20 mg by mouth every other day.    [provider]  ondansetron (ZOFRAN-ODT) 8 MG disintegrating tablet Take 1 tablet (8 mg total) by mouth every 8 (eight) hours as needed for nausea or vomiting. 03/14/23   Linwood Dibbles, MD  oxyCODONE-acetaminophen (PERCOCET/ROXICET) 5-325 MG tablet Take 1 tablet by mouth every 6 (six) hours as needed for severe pain. 03/14/23   Linwood Dibbles, MD  spironolactone (ALDACTONE) 50 MG tablet TAKE 1 TABLET BY MOUTH EVERY DAY 12/23/22   Everrett Coombe, DO  WEGOVY 2.4 MG/0.75ML SOAJ Inject 2.4 mg into the skin once a week. Patient not taking: Reported on 03/19/2023 01/22/23   Everrett Coombe, DO    Physical Exam: Vitals:   03/19/23 1730 03/19/23 1743 03/19/23 1815 03/19/23 1851  BP: (!) 162/83  (!) 154/81   Pulse: 83  84   Resp:   18   Temp:    97.9 F (36.6 C)  TempSrc:      SpO2: 100%  98%   Weight:  83.9 kg    Height:  5\' 6"  (1.676 m)      Physical Exam Vitals and nursing note reviewed.  Constitutional:      General: She is not in acute distress.     Appearance: She is obese. She is not toxic-appearing.  HENT:     Head: Normocephalic and atraumatic.     Mouth/Throat:     Mouth: Mucous membranes are moist.     Pharynx: Oropharynx is clear.  Eyes:     Extraocular Movements: Extraocular movements intact.     Pupils: Pupils are equal, round, and reactive to light.  Cardiovascular:     Rate and Rhythm: Normal rate and regular rhythm.     Heart sounds: Normal heart sounds.  Pulmonary:     Effort: Pulmonary effort is normal.     Breath sounds: Normal breath sounds.  Abdominal:     General: Abdomen is protuberant. Bowel sounds are decreased.     Palpations: Abdomen is soft.     Tenderness: There is abdominal tenderness in the left upper quadrant and left lower quadrant. There is rebound. There is no guarding.     Hernia: No hernia is present.  Skin:    General: Skin is warm and dry.  Neurological:     General: No focal deficit present.     Mental Status: She is alert and oriented to person, place, and time.     Cranial Nerves: No cranial nerve deficit.  Psychiatric:        Mood and Affect: Mood normal.        Behavior: Behavior normal.      Labs on Admission: I have personally reviewed following labs and imaging studies  CBC: Recent Labs  Lab 03/14/23 1712 03/19/23 1512  WBC 8.5 8.1  HGB 14.3 13.9  HCT 43.7 44.0  MCV 84.7 86.8  PLT 341 312   Basic Metabolic Panel: Recent Labs  Lab 03/14/23 1712 03/19/23 1512  NA 137 136  K 4.3 3.8  CL 101 100  CO2 29 29  GLUCOSE 109* 144*  BUN 33* 14  CREATININE 0.87 0.81  CALCIUM 9.6 8.9   GFR: Estimated Creatinine Clearance: 80.6 mL/min (by C-G formula based on SCr of 0.81 mg/dL). Liver Function Tests: Recent Labs  Lab 03/14/23 1712 03/19/23 1512  AST 20 38  ALT 16 60*  ALKPHOS 63 76  BILITOT 0.5 0.4  PROT 7.1 6.6  ALBUMIN 4.2 3.5   Recent Labs  Lab 03/14/23 1712 03/19/23 1512  LIPASE 131* 44   No results for input(s): "AMMONIA" in the last 168  hours. Coagulation Profile: No results for input(s): "INR", "PROTIME" in the last 168 hours. Cardiac Enzymes: No results for input(s): "CKTOTAL", "CKMB", "CKMBINDEX", "TROPONINI" in the last 168 hours. BNP (last 3 results) No results for input(s): "PROBNP" in the last 8760 hours. HbA1C: No results for input(s): "HGBA1C" in the last 72 hours. CBG: No results for input(s): "GLUCAP" in the last 168 hours. Lipid Profile: No results for input(s): "CHOL", "HDL", "LDLCALC", "TRIG", "CHOLHDL", "LDLDIRECT" in the last 72 hours. Thyroid Function Tests: No results for input(s): "TSH", "T4TOTAL", "FREET4", "T3FREE", "THYROIDAB" in the last 72 hours. Anemia Panel: No results for input(s): "VITAMINB12", "FOLATE", "FERRITIN", "TIBC", "IRON", "RETICCTPCT" in the last 72 hours. Urine analysis:    Component Value Date/Time   COLORURINE YELLOW 03/14/2023 1752   APPEARANCEUR CLEAR 03/14/2023 1752   LABSPEC 1.034 (H) 03/14/2023 1752   PHURINE 5.5 03/14/2023 1752   GLUCOSEU NEGATIVE 03/14/2023 1752   HGBUR NEGATIVE 03/14/2023 1752   BILIRUBINUR NEGATIVE 03/14/2023 1752   KETONESUR NEGATIVE 03/14/2023 1752   PROTEINUR TRACE (A) 03/14/2023 1752   NITRITE NEGATIVE 03/14/2023 1752   LEUKOCYTESUR MODERATE (A) 03/14/2023 1752    Radiological Exams on Admission: I have personally reviewed images CT Abdomen Pelvis W Contrast  Result Date: 03/19/2023 CLINICAL DATA:  Left lower quadrant pain. EXAM: CT ABDOMEN AND PELVIS WITH CONTRAST TECHNIQUE: Multidetector CT imaging of the abdomen and pelvis was performed using the standard protocol following bolus administration of intravenous contrast. RADIATION DOSE REDUCTION: This exam was performed according to the departmental dose-optimization program which includes automated exposure control, adjustment of the mA and/or kV according to patient size and/or use of iterative reconstruction technique.  CONTRAST:  OMNIPAQUE IOHEXOL 300 MG/ML  SOLN COMPARISON:  CT  abdomen pelvis 03/14/2023 FINDINGS: Lower chest: Normal heart size. Dependent atelectasis lung bases bilaterally. No pleural effusion. Hepatobiliary: Liver is normal in size and contour. No focal hepatic lesion. Prior cholecystectomy. Pancreas: Unremarkable Spleen: Unremarkable Adrenals/Urinary Tract: Normal adrenal glands. Kidneys enhance symmetrically with contrast. No hydronephrosis. There is a 2.3 cm exophytic simple cyst midpole right kidney. No imaging follow-up needed. Stomach/Bowel: There is circumferential wall thickening of the descending and sigmoid colon with small amount of pericolonic fluid particularly within the left pericolic gutter, which has progressed when compared to recent prior CT. There is a longer segment colonic thickening which starts in the proximal descending colon, increased from prior. Sigmoid colonic diverticulosis. Stool throughout the colon. No evidence for small bowel obstruction. No free intraperitoneal air. Small hiatal hernia. Normal morphology of the stomach. Vascular/Lymphatic: Normal caliber abdominal aorta. No retroperitoneal lymphadenopathy. Reproductive: Prior hysterectomy.  No pelvic masses. Other: None. Musculoskeletal: Lower thoracic and lumbar spine degenerative changes. Bilateral hip arthroplasties. IMPRESSION: 1. When compared to recent prior CT there has been increased circumferential wall thickening of the descending and sigmoid colon. The wall thickening starts more proximally near the splenic flexure. There is increased pericolonic fat stranding and fluid within the left pericolic gutter. Findings may represent worsening diverticulitis or colitis. After resolution of the acute symptomatology, if the patient has not had recent colonoscopy, this is recommended. Electronically Signed   By: Annia Belt M.D.   On: 03/19/2023 12:21    EKG: I have personally reviewed EKG: no EKG on chart  Assessment/Plan Principal Problem:   Diverticulitis large intestine w/o  perforation or abscess w/o bleeding Active Problems:   Constipation   Essential hypertension   Hypothyroidism   Severe obstructive sleep apnea-hypopnea syndrome   Diverticulitis    Assessment and Plan: Constipation Patient reports no BM x 18 days. She has tried dulcolax w/o success. BS hypoactive.  Plan Sorbitol 70% 30 cc q8 x 3  Diverticulitis Patient failed outpatient treatment with augmentin. CT reveals worsening diverticulitis with increased bowel thickening and stranding w/o perforation. On exam - absent BS left side, decreased BS right, marked tenderness LUQ, less pain LLQ, no guarding, positive for rebound tenderness.  Plan Med-surg admit for IV abx - cefepime and flagyl  Severe obstructive sleep apnea-hypopnea syndrome Chronic problem.  Plan Respiratory consult for CPAP HS  Hypothyroidism Lat TSH 1 year ago 0.58  Plan TSH  Continue home dose synthroid  Essential hypertension BP mildly elevated.  Plan Continue home meds       DVT prophylaxis: Lovenox Code Status: Full Code Family Communication: spouse present during exam. Agrees with tx plan  Disposition Plan: home when stable  Consults called: none  Admission status: Inpatient, Med-Surg   Illene Regulus, MD [facility] 03/19/2023, 7:14 PM

## 2023-03-19 NOTE — ED Provider Notes (Signed)
[place] EMERGENCY DEPARTMENT AT [facility] Provider Note   CSN: [id number] Arrival date & time: 03/19/23  1458     History  Chief complaint: Diverticulitis  Christy Todd is a 62 y.o. female.  HPI   Patient has history of hypertension hypothyroidism asthma prediabetes GERD factor V Leiden mutation.  Patient was recently in the emergency room 5 days ago and was diagnosed with diverticulitis.  I saw her at that time.  Patient was started on Augmentin with outpatient treatment.  Patient had repeat CT scan today which showed increased circumferential wall thickening of the descending and sigmoid colon.  Findings represented worsening diverticulitis or colitis.  No evidence of abscess.  Patient states she has continued to have nausea.  She has not had a normal bowel movement.  She has not had any vomiting.  No recent fevers.  Home Medications Prior to Admission medications   Medication Sig Start Date End Date Taking? Authorizing Provider  albuterol (VENTOLIN HFA) 108 (90 Base) MCG/ACT inhaler Inhale 2 puffs into the lungs every 6 (six) hours as needed for wheezing or shortness of breath. 07/23/21   Waldon Merl, PA-C  amoxicillin-clavulanate (AUGMENTIN) 875-125 MG tablet Take 1 tablet by mouth every 12 (twelve) hours for 10 days. 03/14/23 03/24/23  Linwood Dibbles, MD  aspirin EC 81 MG tablet Take 1 tablet (81 mg total) by mouth 2 (two) times daily. To be taken after surgery to prevent blood clots Patient taking differently: Take 81 mg by mouth daily. To be taken after surgery to prevent blood clots 03/31/22 03/31/23  Cristie Hem, PA-C  Cholecalciferol (VITAMIN D3) 50 MCG (2000 UT) TABS Take 2,000 Units by mouth daily.    [provider]  DULoxetine (CYMBALTA) 30 MG capsule 2 BY MOUTH EVERY MORNING, 1 BY MOUTH EVERY AT NIGHT 03/03/23   Everrett Coombe, DO  levothyroxine (SYNTHROID) 125 MCG tablet TAKE 1 TABLET BY MOUTH EVERY DAY BEFORE BREAKFAST 07/27/22   Everrett Coombe,  DO  Magnesium 200 MG TABS Take 200 mg by mouth daily.    [provider]  montelukast (SINGULAIR) 10 MG tablet TAKE 1 TABLET BY MOUTH EVERY DAY 12/28/22   Everrett Coombe, DO  NON FORMULARY Pt uses cpap nightly    [provider]  omeprazole (PRILOSEC) 20 MG capsule Take 20 mg by mouth every other day.    [provider]  ondansetron (ZOFRAN-ODT) 8 MG disintegrating tablet Take 1 tablet (8 mg total) by mouth every 8 (eight) hours as needed for nausea or vomiting. 03/14/23   Linwood Dibbles, MD  oxyCODONE-acetaminophen (PERCOCET/ROXICET) 5-325 MG tablet Take 1 tablet by mouth every 6 (six) hours as needed for severe pain. 03/14/23   Linwood Dibbles, MD  spironolactone (ALDACTONE) 50 MG tablet TAKE 1 TABLET BY MOUTH EVERY DAY 12/23/22   Everrett Coombe, DO  WEGOVY 2.4 MG/0.75ML SOAJ Inject 2.4 mg into the skin once a week. Patient not taking: Reported on 03/19/2023 01/22/23   Everrett Coombe, DO      Allergies    Ciprofloxacin, Apple juice, Gluten meal, Benzoin, Claritin [loratadine], and Septra [sulfamethoxazole-trimethoprim]    Review of Systems   Review of Systems  Physical Exam Updated Vital Signs BP (!) 162/88   Pulse 71   Temp 97.9 F (36.6 C) (Oral)   Resp 18   LMP 11/02/1993   SpO2 99%  Physical Exam Vitals and nursing note reviewed.  Constitutional:      General: She is not in acute distress.  Appearance: She is well-developed.  HENT:     Head: Normocephalic and atraumatic.     Right Ear: External ear normal.     Left Ear: External ear normal.  Eyes:     General: No scleral icterus.       Right eye: No discharge.        Left eye: No discharge.     Conjunctiva/sclera: Conjunctivae normal.  Neck:     Trachea: No tracheal deviation.  Cardiovascular:     Rate and Rhythm: Normal rate.  Pulmonary:     Effort: Pulmonary effort is normal. No respiratory distress.     Breath sounds: No stridor.  Abdominal:     General: There is no distension.     Tenderness:  There is abdominal tenderness.  Musculoskeletal:        General: No swelling or deformity.     Cervical back: Neck supple.  Skin:    General: Skin is warm and dry.     Findings: No rash.  Neurological:     Mental Status: She is alert. Mental status is at baseline.     Cranial Nerves: No dysarthria or facial asymmetry.     Motor: No seizure activity.     ED Results / Procedures / Treatments   Labs (all labs ordered are listed, but only abnormal results are displayed) Labs Reviewed  COMPREHENSIVE METABOLIC PANEL - Abnormal; Notable for the following components:      Result Value   Glucose, Bld 144 (*)    ALT 60 (*)    All other components within normal limits  LIPASE, BLOOD  CBC    EKG None  Radiology CT Abdomen Pelvis W Contrast  Result Date: 03/19/2023 CLINICAL DATA:  Left lower quadrant pain. EXAM: CT ABDOMEN AND PELVIS WITH CONTRAST TECHNIQUE: Multidetector CT imaging of the abdomen and pelvis was performed using the standard protocol following bolus administration of intravenous contrast. RADIATION DOSE REDUCTION: This exam was performed according to the departmental dose-optimization program which includes automated exposure control, adjustment of the mA and/or kV according to patient size and/or use of iterative reconstruction technique. CONTRAST:  OMNIPAQUE IOHEXOL 300 MG/ML  SOLN COMPARISON:  CT abdomen pelvis 03/14/2023 FINDINGS: Lower chest: Normal heart size. Dependent atelectasis lung bases bilaterally. No pleural effusion. Hepatobiliary: Liver is normal in size and contour. No focal hepatic lesion. Prior cholecystectomy. Pancreas: Unremarkable Spleen: Unremarkable Adrenals/Urinary Tract: Normal adrenal glands. Kidneys enhance symmetrically with contrast. No hydronephrosis. There is a 2.3 cm exophytic simple cyst midpole right kidney. No imaging follow-up needed. Stomach/Bowel: There is circumferential wall thickening of the descending and sigmoid colon with small  amount of pericolonic fluid particularly within the left pericolic gutter, which has progressed when compared to recent prior CT. There is a longer segment colonic thickening which starts in the proximal descending colon, increased from prior. Sigmoid colonic diverticulosis. Stool throughout the colon. No evidence for small bowel obstruction. No free intraperitoneal air. Small hiatal hernia. Normal morphology of the stomach. Vascular/Lymphatic: Normal caliber abdominal aorta. No retroperitoneal lymphadenopathy. Reproductive: Prior hysterectomy.  No pelvic masses. Other: None. Musculoskeletal: Lower thoracic and lumbar spine degenerative changes. Bilateral hip arthroplasties. IMPRESSION: 1. When compared to recent prior CT there has been increased circumferential wall thickening of the descending and sigmoid colon. The wall thickening starts more proximally near the splenic flexure. There is increased pericolonic fat stranding and fluid within the left pericolic gutter. Findings may represent worsening diverticulitis or colitis. After resolution of the acute symptomatology, if  the patient has not had recent colonoscopy, this is recommended. Electronically Signed   By: Annia Belt M.D.   On: 03/19/2023 12:21    Procedures Procedures    Medications Ordered in ED Medications  ceFEPIme (MAXIPIME) 2 g in sodium chloride 0.9 % 100 mL IVPB (2 g Intravenous New Bag/Given 03/19/23 1723)    And  metroNIDAZOLE (FLAGYL) IVPB 500 mg (has no administration in time range)  sodium chloride 0.9 % bolus 500 mL (500 mLs Intravenous New Bag/Given 03/19/23 1721)    Followed by  0.9 %  sodium chloride infusion (has no administration in time range)  ondansetron (ZOFRAN-ODT) disintegrating tablet 4 mg (4 mg Oral Given 03/19/23 1511)  morphine (PF) 4 MG/ML injection 4 mg (4 mg Intravenous Given 03/19/23 1719)  ondansetron (ZOFRAN) injection 4 mg (4 mg Intravenous Given 03/19/23 1719)    ED Course/ Medical Decision Making/  A&P Clinical Course as of 03/19/23 1739  Fri Mar 19, 2023  1702 Comprehensive metabolic panel(!) Metabolic panel normal.  CBC normal.  Lipase normal. [JK]  1730 Reviewed case with Dr Debby Bud regarding admisison [JK]    Clinical Course User Index [JK] Linwood Dibbles, MD                             Medical Decision Making Problems Addressed: Acute diverticulitis: acute illness or injury that poses a threat to life or bodily functions  Amount and/or Complexity of Data Reviewed External Data Reviewed: radiology.    Details: Outpatient CT scan report reviewed Labs: ordered. Decision-making details documented in ED Course.  Risk Prescription drug management. Parenteral controlled substances. Decision regarding hospitalization.   Patient presented to the ED for evaluation of persistent pain associated with a recent diverticulitis diagnosis.  I saw patient myself 5 days ago.  Her CT scan at that time showed diverticulitis.  Patient was started on Augmentin.  She has not been feeling any better.  She continues to have nausea and pain.  She has not had normal bowel movement.  Her CT scan performed today suggested worsening of her diverticulitis.  No evidence of bowel obstruction.  With her persistent symptoms I will consult the medical service for IV antibiotic admission.        Final Clinical Impression(s) / ED Diagnoses Final diagnoses:  Acute diverticulitis    Rx / DC Orders ED Discharge Orders     None         Linwood Dibbles, MD 03/19/23 1739

## 2023-03-19 NOTE — Assessment & Plan Note (Signed)
BP mildly elevated.  Plan Continue home meds

## 2023-03-19 NOTE — Progress Notes (Signed)
Christy Todd - 62 y.o. female MRN [id number]  Date of birth: 07-15-1961  Subjective Chief Complaint  Patient presents with   Hospitalization Follow-up    HPI Christy Todd is a 62 y.o. female here today for follow up of ED visit. Noted vomiting and diarrhea, initially she thought she had a GI bug.  Gradually developed lower abdominal pain in both the right and lower abdomen.  Bowel movements decreased.  Given dose of ceftriaxone and flagyl in the ED and discharged with augmentin.  She continues to have quite a bit of pain, requiring percocet.  She has not had a good bowel movement in about 5-6 days.  Appetite is decreased.  She has tried dulcolax, increased fluids, stool softener without any improvement.  She has allergies to cipro and sulfa.    ROS:  A comprehensive ROS was completed and negative except as noted per HPI  Allergies  Allergen Reactions   Ciprofloxacin Other (See Comments)    Detatched muscles and tendons    Apple Juice     ALL form of apple  Chemical burn to mouth and gums   Gluten Meal Other (See Comments)    Gut Distress   Benzoin    Claritin [Loratadine] Other (See Comments)    allergic psychosis   Septra [Sulfamethoxazole-Trimethoprim] Rash    Past Medical History:  Diagnosis Date   Arthritis    Asthma    COVID-19 07/23/2021   Depression    pt states she does not have depression. Cymbalta is used for pain control   Factor 5 Leiden mutation, heterozygous (HCC)    GERD (gastroesophageal reflux disease)    Hypertension    Hypothyroid    Pre-diabetes    pt states she is no longer pre-diabetic due to weight loss   Sleep apnea    on CPAP   Wears hearing aid in right ear     Past Surgical History:  Procedure Laterality Date   ABDOMINAL HYSTERECTOMY     ANKLE RECONSTRUCTION     CHOLECYSTECTOMY     COLONOSCOPY  12/09/2018   Dr. Ger[facility].  3 mm sessile polyp, hepatic flexure. Mild - Moderate diverticulosis, sigmoid  colon.   CYSTOCELE REPAIR     ESOPHAGOGASTRODUODENOSCOPY  12/09/2018   Dr. Ger[facility]. Normal upper GI tract   RECTOCELE REPAIR     TENDON REPAIR     TOTAL ABDOMINAL HYSTERECTOMY     TOTAL HIP ARTHROPLASTY Right 08/25/2021   Procedure: RIGHT TOTAL HIP ARTHROPLASTY ANTERIOR APPROACH;  Surgeon: Tarry Kos, MD;  Location: [facility] OR;  Service: Orthopedics;  Laterality: Right;  3-C   TOTAL HIP ARTHROPLASTY Left 04/06/2022   Procedure: LEFT TOTAL HIP ARTHROPLASTY ANTERIOR APPROACH;  Surgeon: Tarry Kos, MD;  Location: [facility] OR;  Service: Orthopedics;  Laterality: Left;   TUBAL LIGATION      Social History   Socioeconomic History   Marital status: Married    Spouse name: Amyree Roesel   Number of children: 2   Years of education: Not on file   Highest education level: 12th grade  Occupational History   Not on file  Tobacco Use   Smoking status: Never   Smokeless tobacco: Never  Vaping Use   Vaping Use: Never used  Substance and Sexual Activity   Alcohol use: Not Currently   Drug use: Never   Sexual activity: Yes    Partners: Male  Other Topics Concern   Not on file  Social History Narrative   Not on file   Social Determinants of Health   Financial Resource Strain: Low Risk  (03/19/2023)   Overall Financial Resource Strain (CARDIA)    Difficulty of Paying Living Expenses: Not hard at all  Food Insecurity: No Food Insecurity (03/19/2023)   Hunger Vital Sign    Worried About Running Out of Food in the Last Year: Never true    Ran Out of Food in the Last Year: Never true  Transportation Needs: No Transportation Needs (03/19/2023)   PRAPARE - Administrator, Civil Service (Medical): No    Lack of Transportation (Non-Medical): No  Physical Activity: Sufficiently Active (03/19/2023)   Exercise Vital Sign    Days of Exercise per Week: 5 days    Minutes of Exercise per Session: 80 min  Stress: Stress Concern Present (03/19/2023)    [employer] of Occupational Health - Occupational Stress Questionnaire    Feeling of Stress : To some extent  Social Connections: Socially Integrated (03/19/2023)   Social Connection and Isolation Panel [NHANES]    Frequency of Communication with Friends and Family: Three times a week    Frequency of Social Gatherings with Friends and Family: More than three times a week    Attends Religious Services: More than 4 times per year    Active Member of Clubs or Organizations: Yes    Attends Banker Meetings: 1 to 4 times per year    Marital Status: Married    Family History  Problem Relation Age of Onset   Hypertension Father    Pancreatic cancer Father    Diabetes Maternal Grandmother    Colon cancer Neg Hx    Stomach cancer Neg Hx    Esophageal cancer Neg Hx     Health Maintenance  Topic Date Due   HIV Screening  Never done   Hepatitis C Screening  Never done   MAMMOGRAM  01/22/2024 (Originally 07/10/2012)   COVID-19 Vaccine (5 - 2023-24 season) 02/07/2024 (Originally 07/03/2022)   INFLUENZA VACCINE  06/03/2023   DTaP/Tdap/Td (2 - Td or Tdap) 08/27/2030   COLONOSCOPY (Pts 45-55yrs Insurance coverage will need to be confirmed)  07/05/2031   Zoster Vaccines- Shingrix  Completed   HPV VACCINES  Aged Out   PAP SMEAR-Modifier  Discontinued     ----------------------------------------------------------------------------------------------------------------------------------------------------------------------------------------------------------------- Physical Exam BP 135/89 (BP Location: Right Arm, Patient Position: Sitting, Cuff Size: Large)   Pulse 72   Ht 5\' 6"  (1.676 m)   Wt 185 lb (83.9 kg)   LMP 11/02/1993   SpO2 99%   BMI 29.86 kg/m   Physical Exam Constitutional:      Comments: She appears uncomfortable  Eyes:     General: No scleral icterus. Cardiovascular:     Rate and Rhythm: Normal rate and regular rhythm.  Pulmonary:     Effort: Pulmonary  effort is normal.     Breath sounds: Normal breath sounds.  Abdominal:     General: There is distension.     Tenderness: There is abdominal tenderness.     Comments: Bowel sounds decreased.  Neurological:     Mental Status: She is alert.  Psychiatric:        Mood and Affect: Mood normal.        Behavior: Behavior normal.     ------------------------------------------------------------------------------------------------------------------------------------------------------------------------------------------------------------------- Assessment and Plan  Diverticulitis Continued worsening pain, now with some distention as well.  Complication suspected.  Will repeat CT scan of the abdomen/pelvis STAT.  Options are  limited for outpatient therapy due to her allergies and I explained to her that we may need to have her return for to the ED for further treatment if there is evidence of complication or bowel obstruction.     No orders of the defined types were placed in this encounter.   No follow-ups on file.    This visit occurred during the SARS-CoV-2 public health emergency.  Safety protocols were in place, including screening questions prior to the visit, additional usage of staff PPE, and extensive cleaning of exam room while observing appropriate contact time as indicated for disinfecting solutions.

## 2023-03-20 DIAGNOSIS — K5792 Diverticulitis of intestine, part unspecified, without perforation or abscess without bleeding: Secondary | ICD-10-CM

## 2023-03-20 DIAGNOSIS — K5900 Constipation, unspecified: Secondary | ICD-10-CM

## 2023-03-20 DIAGNOSIS — K5732 Diverticulitis of large intestine without perforation or abscess without bleeding: Secondary | ICD-10-CM | POA: Diagnosis not present

## 2023-03-20 DIAGNOSIS — E038 Other specified hypothyroidism: Secondary | ICD-10-CM

## 2023-03-20 DIAGNOSIS — I10 Essential (primary) hypertension: Secondary | ICD-10-CM

## 2023-03-20 DIAGNOSIS — E063 Autoimmune thyroiditis: Secondary | ICD-10-CM

## 2023-03-20 DIAGNOSIS — G4733 Obstructive sleep apnea (adult) (pediatric): Secondary | ICD-10-CM

## 2023-03-20 LAB — BASIC METABOLIC PANEL
Anion gap: 7 (ref 5–15)
BUN: 13 mg/dL (ref 6–20)
CO2: 28 mmol/L (ref 22–32)
Calcium: 8.8 mg/dL — ABNORMAL LOW (ref 8.9–10.3)
Chloride: 104 mmol/L (ref 98–111)
Creatinine, Ser: 0.87 mg/dL (ref 0.44–1.00)
GFR, Estimated: >60 mL/min (ref >60)
Glucose, Bld: 95 mg/dL (ref 70–99)
Potassium: 4.3 mmol/L (ref 3.5–5.1)
Sodium: 139 mmol/L (ref 135–145)

## 2023-03-20 MED ORDER — POLYETHYLENE GLYCOL 3350 17 G PO PACK
17.0000 g | PACK | Freq: Every day | ORAL | Status: DC
  Administered 2023-03-20: 17 g via ORAL
  Filled 2023-03-20 (×2): qty 1

## 2023-03-20 NOTE — Progress Notes (Signed)
PROGRESS NOTE    Christy Todd  ZOX:[id number] DOB: April 20, 1961 DOA: 03/19/2023 PCP: Everrett Coombe, DO    Brief Narrative:  Christy Todd, is a 62 years old female with past medical history of hypertension, hypothyroidism, seizure disorder, obstructive sleep apnea who was recently  seen in [facility] on 03/14/23 and diagnosed with diverticulitis was sent home on oral Augmentin but continued to have abdominal pain nausea [place]hertz C-V presented to the ED again for reevaluation.  In the ED vitals were stable.  Abdomen was tender to palpation.  Labs showed WBC at 8.0 CT scan of the abdomen pelvis showed increased bowel thickening of the descending and sigmoid colon with increased stranding.  Patient also complained of constipation for more than 2 weeks.  Patient was then admitted hospital for further evaluation and treatment.  Assessment and Plan:  Constipation No reported bowel movements for 18 days prior to presentation.  Did have a bowel movement today.  Received sorbitol while in the hospital.  Will continue with MiraLAX.    Acute diverticulitis Failed outpatient treatment with Augmentin.  CT reveals worsening diverticulitis with increased bowel thickening and stranding w/o perforation.  Continue IV antibiotic with cefepime and Flagyl.  Will continue to monitor closely.  On analgesic regimen.  Complains of left lower quadrant pain.  Severe obstructive sleep apnea-hypopnea syndrome Continue CPAP at nighttime.   Hypothyroidism TSH 1 year ago 0.58.  Current TSH at 7.1.  On Synthroid.  Might need adjustment in doses prior to discharge.   Essential hypertension Elevated blood pressure on presentation.  Improved.  On spironolactone.      DVT prophylaxis: enoxaparin (LOVENOX) injection 40 mg Start: 03/19/23 1915   Code Status:     Code Status: Full Code  Disposition: Home likely in 1 to 2 days  Status is: Inpatient Remains inpatient appropriate because: Acute diverticulitis, IV antibiotic    Family Communication: Spoke with the husband at bedside.  Consultants:  None  Procedures:  None  Antimicrobials:  Flagyl and cefepime  Anti-infectives (From admission, onward)    Start     Dose/Rate Route Frequency Ordered Stop   03/20/23 0600  metroNIDAZOLE (FLAGYL) IVPB 500 mg       See Hyperspace for full Linked Orders Report.   500 mg 100 mL/hr over 60 Minutes Intravenous Every 12 hours 03/19/23 1906     03/20/23 0000  ceFEPIme (MAXIPIME) 2 g in sodium chloride 0.9 % 100 mL IVPB       See Hyperspace for full Linked Orders Report.   2 g 200 mL/hr over 30 Minutes Intravenous Every 8 hours 03/19/23 1906     03/19/23 1715  ceFEPIme (MAXIPIME) 2 g in sodium chloride 0.9 % 100 mL IVPB       See Hyperspace for full Linked Orders Report.   2 g 200 mL/hr over 30 Minutes Intravenous  Once 03/19/23 1702 03/19/23 1755   03/19/23 1715  metroNIDAZOLE (FLAGYL) IVPB 500 mg       See Hyperspace for full Linked Orders Report.   500 mg 100 mL/hr over 60 Minutes Intravenous  Once 03/19/23 1702 03/19/23 1901        Subjective: Today, patient was seen and examined at bedside.  Patient states that she still complains of the left lower quadrant pain but better with IV pain medication.  Has been eating some.  No nausea vomiting fever chills or rigor.  Finally had a bowel movement after 18 days with sorbitol.  Objective: Vitals:   03/19/23 2117  03/19/23 2353 03/20/23 0349 03/20/23 0844  BP:  (!) 109/57 118/69 130/75  Pulse:  73 81 72  Resp: 16 18 17 16   Temp:  98.2 F (36.8 C) 98.2 F (36.8 C) 98.4 F (36.9 C)  TempSrc:  Oral Oral Oral  SpO2:  97% 97% 90%  Weight:      Height:        Intake/Output Summary (Last 24 hours) at 03/20/2023 1007 Last data filed at 03/19/2023 2014 Gross per 24 hour  Intake 1014.64 ml  Output --  Net 1014.64 ml   Filed Weights   03/19/23 1743  Weight: 83.9 kg    Physical Examination: Body mass index is 29.85 kg/m.  General:  Average built, not in  obvious distress HENT:   No scleral pallor or icterus noted. Oral mucosa is moist.  Chest:    Diminished breath sounds bilaterally. No crackles or wheezes.  CVS: S1 &S2 heard. No murmur.  Regular rate and rhythm. Abdomen: Soft, nontender, nondistended.  Bowel sounds are heard.  Left lower quadrant tenderness. Extremities: No cyanosis, clubbing or edema.  Peripheral pulses are palpable. Psych: Alert, awake and oriented, normal mood CNS:  No cranial nerve deficits.  Power equal in all extremities.   Skin: Warm and dry.  No rashes noted.  Data Reviewed:   CBC: Recent Labs  Lab 03/14/23 1712 03/19/23 1512  WBC 8.5 8.1  HGB 14.3 13.9  HCT 43.7 44.0  MCV 84.7 86.8  PLT 341 312    Basic Metabolic Panel: Recent Labs  Lab 03/14/23 1712 03/19/23 1512 03/20/23 0056  NA 137 136 139  K 4.3 3.8 4.3  CL 101 100 104  CO2 29 29 28   GLUCOSE 109* 144* 95  BUN 33* 14 13  CREATININE 0.87 0.81 0.87  CALCIUM 9.6 8.9 8.8*    Liver Function Tests: Recent Labs  Lab 03/14/23 1712 03/19/23 1512  AST 20 38  ALT 16 60*  ALKPHOS 63 76  BILITOT 0.5 0.4  PROT 7.1 6.6  ALBUMIN 4.2 3.5     Radiology Studies: CT Abdomen Pelvis W Contrast  Result Date: 03/19/2023 CLINICAL DATA:  Left lower quadrant pain. EXAM: CT ABDOMEN AND PELVIS WITH CONTRAST TECHNIQUE: Multidetector CT imaging of the abdomen and pelvis was performed using the standard protocol following bolus administration of intravenous contrast. RADIATION DOSE REDUCTION: This exam was performed according to the departmental dose-optimization program which includes automated exposure control, adjustment of the mA and/or kV according to patient size and/or use of iterative reconstruction technique. CONTRAST:  OMNIPAQUE IOHEXOL 300 MG/ML  SOLN COMPARISON:  CT abdomen pelvis 03/14/2023 FINDINGS: Lower chest: Normal heart size. Dependent atelectasis lung bases bilaterally. No pleural effusion. Hepatobiliary: Liver is normal in size and  contour. No focal hepatic lesion. Prior cholecystectomy. Pancreas: Unremarkable Spleen: Unremarkable Adrenals/Urinary Tract: Normal adrenal glands. Kidneys enhance symmetrically with contrast. No hydronephrosis. There is a 2.3 cm exophytic simple cyst midpole right kidney. No imaging follow-up needed. Stomach/Bowel: There is circumferential wall thickening of the descending and sigmoid colon with small amount of pericolonic fluid particularly within the left pericolic gutter, which has progressed when compared to recent prior CT. There is a longer segment colonic thickening which starts in the proximal descending colon, increased from prior. Sigmoid colonic diverticulosis. Stool throughout the colon. No evidence for small bowel obstruction. No free intraperitoneal air. Small hiatal hernia. Normal morphology of the stomach. Vascular/Lymphatic: Normal caliber abdominal aorta. No retroperitoneal lymphadenopathy. Reproductive: Prior hysterectomy.  No pelvic masses. Other: None.  Musculoskeletal: Lower thoracic and lumbar spine degenerative changes. Bilateral hip arthroplasties. IMPRESSION: 1. When compared to recent prior CT there has been increased circumferential wall thickening of the descending and sigmoid colon. The wall thickening starts more proximally near the splenic flexure. There is increased pericolonic fat stranding and fluid within the left pericolic gutter. Findings may represent worsening diverticulitis or colitis. After resolution of the acute symptomatology, if the patient has not had recent colonoscopy, this is recommended. Electronically Signed   By: Annia Belt M.D.   On: 03/19/2023 12:21      LOS: 1 day    Joycelyn Das, MD [facility] Available via Epic secure chat 7am-7pm After these hours, please refer to coverage provider listed on [email or web address] 03/20/2023, 10:07 AM

## 2023-03-20 NOTE — Hospital Course (Signed)
Christy Todd, is a 62 years old female with past medical history of hypertension, hypothyroidism, seizure disorder, obstructive sleep apnea who was recently  seen in [facility] on 03/14/23 and diagnosed with diverticulitis was sent home on oral Augmentin but continued to have abdominal pain nausea [place] C-V presented to the ED again for reevaluation.  In the ED vitals were stable.  Abdomen was tender to palpation.  Labs showed WBC at 8.0 CT scan of the abdomen pelvis showed increased bowel thickening of the descending and sigmoid colon with increased stranding.  Patient also complained of constipation for more than 2 weeks.  Patient was then admitted hospital for further evaluation and treatment.  Assessment and Plan:  Constipation No reported bowel movements.  On sorbitol.  Acute diverticulitis Failed outpatient treatment with Augmentin.  CT reveals worsening diverticulitis with increased bowel thickening and stranding w/o perforation.  Continue IV antibiotic with cefepime and Flagyl.  Will continue to monitor closely.  On analgesic regimen.  Severe obstructive sleep apnea-hypopnea syndrome Continue CPAP at nighttime.   Hypothyroidism TSH 1 year ago 0.58.  Current TSH at 7.1.  On Synthroid.  Might need adjustment in doses prior to discharge.   Essential hypertension Elevated blood pressure on presentation.  Improved.  On spironolactone.

## 2023-03-20 NOTE — Plan of Care (Signed)

## 2023-03-20 NOTE — Progress Notes (Signed)
Pt. Places herself on cpap when ready. 

## 2023-03-21 DIAGNOSIS — K5792 Diverticulitis of intestine, part unspecified, without perforation or abscess without bleeding: Secondary | ICD-10-CM | POA: Diagnosis not present

## 2023-03-21 DIAGNOSIS — I10 Essential (primary) hypertension: Secondary | ICD-10-CM | POA: Diagnosis not present

## 2023-03-21 DIAGNOSIS — K5900 Constipation, unspecified: Secondary | ICD-10-CM | POA: Diagnosis not present

## 2023-03-21 DIAGNOSIS — K5732 Diverticulitis of large intestine without perforation or abscess without bleeding: Secondary | ICD-10-CM | POA: Diagnosis not present

## 2023-03-21 LAB — CBC
HCT: 36.7 % (ref 36.0–46.0)
Hemoglobin: 12 g/dL (ref 12.0–15.0)
MCH: 27.8 pg (ref 26.0–34.0)
MCHC: 32.7 g/dL (ref 30.0–36.0)
MCV: 85.2 fL (ref 80.0–100.0)
Platelets: 272 10*3/uL (ref 150–400)
RBC: 4.31 MIL/uL (ref 3.87–5.11)
RDW: 13.2 % (ref 11.5–15.5)
WBC: 8.2 10*3/uL (ref 4.0–10.5)
nRBC: 0 % (ref 0.0–0.2)

## 2023-03-21 LAB — BASIC METABOLIC PANEL
Anion gap: 7 (ref 5–15)
BUN: 11 mg/dL (ref 6–20)
CO2: 26 mmol/L (ref 22–32)
Calcium: 8.8 mg/dL — ABNORMAL LOW (ref 8.9–10.3)
Chloride: 104 mmol/L (ref 98–111)
Creatinine, Ser: 0.8 mg/dL (ref 0.44–1.00)
GFR, Estimated: >60 mL/min (ref >60)
Glucose, Bld: 96 mg/dL (ref 70–99)
Potassium: 3.8 mmol/L (ref 3.5–5.1)
Sodium: 137 mmol/L (ref 135–145)

## 2023-03-21 LAB — MAGNESIUM: Magnesium: 1.6 mg/dL — ABNORMAL LOW (ref 1.7–2.4)

## 2023-03-21 MED ORDER — BISACODYL 10 MG RE SUPP: 10.0000 mg | Freq: Every day | RECTAL | Status: DC | PRN

## 2023-03-21 MED ORDER — MAGNESIUM OXIDE -MG SUPPLEMENT 400 (240 MG) MG PO TABS
400.0000 mg | ORAL_TABLET | Freq: Two times a day (BID) | ORAL | Status: DC
  Administered 2023-03-21 – 2023-03-22 (×3): 400 mg via ORAL
  Filled 2023-03-21 (×3): qty 1

## 2023-03-21 MED ORDER — MAGNESIUM SULFATE 2 GM/50ML IV SOLN
2.0000 g | Freq: Once | INTRAVENOUS | Status: AC
  Administered 2023-03-21: 2 g via INTRAVENOUS
  Filled 2023-03-21: qty 50

## 2023-03-21 NOTE — Progress Notes (Addendum)
PROGRESS NOTE    Christy Todd  WUJ:[id number] DOB: Jun 21, 1961 DOA: 03/19/2023 PCP: Everrett Coombe, DO    Brief Narrative:  Christy Todd, is a 62 years old female with past medical history of hypertension, hypothyroidism, seizure disorder, obstructive sleep apnea who was recently  seen in [facility] on 03/14/23 and diagnosed with diverticulitis was sent home on oral Augmentin but continued to have abdominal pain nausea [place] C-[facility] presented to the ED again for reevaluation.  In the ED, vitals were stable.  Abdomen was tender to palpation.  Labs showed WBC at 8.0 CT scan of the abdomen pelvis showed increased bowel thickening of the descending and sigmoid colon with increased stranding.  Patient also complained of constipation for more than 2 weeks.  Patient was then admitted hospital for further evaluation and treatment.  Assessment and Plan:  Constipation No reported bowel movements for 18 days prior to presentation.  Did have a bowel movement on 03/21/2023.  Does not wish to continue MiraLAX.  Give 1 dose of Dulcolax suppository today.  Acute diverticulitis Failed outpatient treatment with Augmentin.  CT reveals worsening diverticulitis with increased bowel thickening and stranding w/o perforation.  Continue IV antibiotic with cefepime and Flagyl.  Appears to be stable without any leukocytosis or increasing pain.  Need to monitor since patient and failed outpatient treatment.  Received to analgesia this morning.  Severe obstructive sleep apnea-hypopnea syndrome Continue CPAP at nighttime.   Hypothyroidism TSH 1 year ago 0.58.  Current TSH at 7.1.  On Synthroid.  Might need adjustment in doses prior to discharge.   Essential hypertension Elevated blood pressure on presentation.  Improved.  On spironolactone.   Hypomagnesemia.  Will replace orally and through IV.  Check levels in AM.     DVT prophylaxis: enoxaparin (LOVENOX) injection 40 mg Start: 03/19/23 1915   Code Status:     Code  Status: Full Code  Disposition: Home likely in 1 to 2 days pending clinical improvement.  Status is: Inpatient  Remains inpatient appropriate because: Acute diverticulitis, IV antibiotic, pending clinical improvement.   Family Communication: Spoke with the husband at bedside.  Consultants:  None  Procedures:  None  Antimicrobials:  Flagyl and cefepime 03/19/23  Anti-infectives (From admission, onward)    Start     Dose/Rate Route Frequency Ordered Stop   03/20/23 0600  metroNIDAZOLE (FLAGYL) IVPB 500 mg       See Hyperspace for full Linked Orders Report.   500 mg 100 mL/hr over 60 Minutes Intravenous Every 12 hours 03/19/23 1906     03/20/23 0000  ceFEPIme (MAXIPIME) 2 g in sodium chloride 0.9 % 100 mL IVPB       See Hyperspace for full Linked Orders Report.   2 g 200 mL/hr over 30 Minutes Intravenous Every 8 hours 03/19/23 1906     03/19/23 1715  ceFEPIme (MAXIPIME) 2 g in sodium chloride 0.9 % 100 mL IVPB       See Hyperspace for full Linked Orders Report.   2 g 200 mL/hr over 30 Minutes Intravenous  Once 03/19/23 1702 03/19/23 1755   03/19/23 1715  metroNIDAZOLE (FLAGYL) IVPB 500 mg       See Hyperspace for full Linked Orders Report.   500 mg 100 mL/hr over 60 Minutes Intravenous  Once 03/19/23 1702 03/19/23 1901      Subjective: Today, patient was seen and examined at bedside.  Did require IV pain medication this morning but did not require for 12 hours yesterday.  Has  been able to hold some liquidy fluid.  Denies any fever chills or rigor.  Has had a bowel movement yesterday but none no further after that.  Objective: Vitals:   03/20/23 0844 03/20/23 1549 03/20/23 1959 03/21/23 0252  BP: 130/75 (!) 172/82 (!) 152/80 (!) 144/71  Pulse: 72 72 79 82  Resp: 16 16 16 18   Temp: 98.4 F (36.9 C) 98.5 F (36.9 C) 98.6 F (37 C) 98.3 F (36.8 C)  TempSrc: Oral Oral Oral Oral  SpO2: 90% 99% 99% 100%  Weight:      Height:        Intake/Output Summary (Last 24  hours) at 03/21/2023 0865 Last data filed at 03/21/2023 0400 Gross per 24 hour  Intake 840 ml  Output --  Net 840 ml    Filed Weights   03/19/23 1743  Weight: 83.9 kg    Physical Examination: Body mass index is 29.85 kg/m.   General:  Average built, not in obvious distress, alert awake and Communicative. HENT:   No scleral pallor or icterus noted. Oral mucosa is moist.  Chest:    Diminished breath sounds bilaterally. No crackles or wheezes.  CVS: S1 &S2 heard. No murmur.  Regular rate and rhythm. Abdomen: Soft, non-distended.  Bowel sounds are heard.  Left lower quadrant tenderness on deep palpation.. Extremities: No cyanosis, clubbing or edema.  Peripheral pulses are palpable. Psych: Alert, awake and oriented, normal mood CNS:  No cranial nerve deficits.  Power equal in all extremities.   Skin: Warm and dry.  No rashes noted.  Data Reviewed:   CBC: Recent Labs  Lab 03/14/23 1712 03/19/23 1512 03/21/23 0048  WBC 8.5 8.1 8.2  HGB 14.3 13.9 12.0  HCT 43.7 44.0 36.7  MCV 84.7 86.8 85.2  PLT 341 312 272     Basic Metabolic Panel: Recent Labs  Lab 03/14/23 1712 03/19/23 1512 03/20/23 0056 03/21/23 0048  NA 137 136 139 137  K 4.3 3.8 4.3 3.8  CL 101 100 104 104  CO2 29 29 28 26   GLUCOSE 109* 144* 95 96  BUN 33* 14 13 11   CREATININE 0.87 0.81 0.87 0.80  CALCIUM 9.6 8.9 8.8* 8.8*  MG  --   --   --  1.6*     Liver Function Tests: Recent Labs  Lab 03/14/23 1712 03/19/23 1512  AST 20 38  ALT 16 60*  ALKPHOS 63 76  BILITOT 0.5 0.4  PROT 7.1 6.6  ALBUMIN 4.2 3.5      Radiology Studies: CT Abdomen Pelvis W Contrast  Result Date: 03/19/2023 CLINICAL DATA:  Left lower quadrant pain. EXAM: CT ABDOMEN AND PELVIS WITH CONTRAST TECHNIQUE: Multidetector CT imaging of the abdomen and pelvis was performed using the standard protocol following bolus administration of intravenous contrast. RADIATION DOSE REDUCTION: This exam was performed according to the  departmental dose-optimization program which includes automated exposure control, adjustment of the mA and/or kV according to patient size and/or use of iterative reconstruction technique. CONTRAST:  OMNIPAQUE IOHEXOL 300 MG/ML  SOLN COMPARISON:  CT abdomen pelvis 03/14/2023 FINDINGS: Lower chest: Normal heart size. Dependent atelectasis lung bases bilaterally. No pleural effusion. Hepatobiliary: Liver is normal in size and contour. No focal hepatic lesion. Prior cholecystectomy. Pancreas: Unremarkable Spleen: Unremarkable Adrenals/Urinary Tract: Normal adrenal glands. Kidneys enhance symmetrically with contrast. No hydronephrosis. There is a 2.3 cm exophytic simple cyst midpole right kidney. No imaging follow-up needed. Stomach/Bowel: There is circumferential wall thickening of the descending and sigmoid colon with  small amount of pericolonic fluid particularly within the left pericolic gutter, which has progressed when compared to recent prior CT. There is a longer segment colonic thickening which starts in the proximal descending colon, increased from prior. Sigmoid colonic diverticulosis. Stool throughout the colon. No evidence for small bowel obstruction. No free intraperitoneal air. Small hiatal hernia. Normal morphology of the stomach. Vascular/Lymphatic: Normal caliber abdominal aorta. No retroperitoneal lymphadenopathy. Reproductive: Prior hysterectomy.  No pelvic masses. Other: None. Musculoskeletal: Lower thoracic and lumbar spine degenerative changes. Bilateral hip arthroplasties. IMPRESSION: 1. When compared to recent prior CT there has been increased circumferential wall thickening of the descending and sigmoid colon. The wall thickening starts more proximally near the splenic flexure. There is increased pericolonic fat stranding and fluid within the left pericolic gutter. Findings may represent worsening diverticulitis or colitis. After resolution of the acute symptomatology, if the patient has  not had recent colonoscopy, this is recommended. Electronically Signed   By: Annia Belt M.D.   On: 03/19/2023 12:21      LOS: 2 days    Joycelyn Das, MD [facility] Available via Epic secure chat 7am-7pm After these hours, please refer to coverage provider listed on [email or web address] 03/21/2023, 9:22 AM

## 2023-03-21 NOTE — Progress Notes (Signed)
Patient states she will place herself on CPAP when ready. No assistance needed.

## 2023-03-22 DIAGNOSIS — K5900 Constipation, unspecified: Secondary | ICD-10-CM | POA: Diagnosis not present

## 2023-03-22 DIAGNOSIS — K5732 Diverticulitis of large intestine without perforation or abscess without bleeding: Secondary | ICD-10-CM | POA: Diagnosis not present

## 2023-03-22 DIAGNOSIS — K5792 Diverticulitis of intestine, part unspecified, without perforation or abscess without bleeding: Secondary | ICD-10-CM | POA: Diagnosis not present

## 2023-03-22 DIAGNOSIS — I10 Essential (primary) hypertension: Secondary | ICD-10-CM | POA: Diagnosis not present

## 2023-03-22 LAB — BASIC METABOLIC PANEL
Anion gap: 6 (ref 5–15)
BUN: 13 mg/dL (ref 6–20)
CO2: 25 mmol/L (ref 22–32)
Calcium: 8.2 mg/dL — ABNORMAL LOW (ref 8.9–10.3)
Chloride: 104 mmol/L (ref 98–111)
Creatinine, Ser: 0.85 mg/dL (ref 0.44–1.00)
GFR, Estimated: >60 mL/min (ref >60)
Glucose, Bld: 93 mg/dL (ref 70–99)
Potassium: 3.9 mmol/L (ref 3.5–5.1)
Sodium: 135 mmol/L (ref 135–145)

## 2023-03-22 LAB — CBC
HCT: 33.9 % — ABNORMAL LOW (ref 36.0–46.0)
Hemoglobin: 10.9 g/dL — ABNORMAL LOW (ref 12.0–15.0)
MCH: 27.2 pg (ref 26.0–34.0)
MCHC: 32.2 g/dL (ref 30.0–36.0)
MCV: 84.5 fL (ref 80.0–100.0)
Platelets: 265 10*3/uL (ref 150–400)
RBC: 4.01 MIL/uL (ref 3.87–5.11)
RDW: 13.3 % (ref 11.5–15.5)
WBC: 7.7 10*3/uL (ref 4.0–10.5)
nRBC: 0 % (ref 0.0–0.2)

## 2023-03-22 LAB — MAGNESIUM: Magnesium: 1.8 mg/dL (ref 1.7–2.4)

## 2023-03-22 MED ORDER — ONDANSETRON 8 MG PO TBDP: 8.0000 mg | ORAL_TABLET | Freq: Three times a day (TID) | ORAL | 0 refills | Status: DC | PRN

## 2023-03-22 NOTE — Discharge Summary (Signed)
Physician Discharge Summary  Christy Todd:[id number] DOB: [birth year] DOA: 03/19/2023  PCP: Everrett Coombe, DO  Admit date: 03/19/2023 Discharge date: 03/22/2023  Admitted From: Home  Discharge disposition: Home  Recommendations for Outpatient Follow-Up:   Follow up with your primary care provider in one week.  Check CBC, BMP, magnesium in the next visit   Discharge Diagnosis:   Principal Problem:   Diverticulitis large intestine w/o perforation or abscess w/o bleeding Active Problems:   Constipation   Essential hypertension   Hypothyroidism   Severe obstructive sleep apnea-hypopnea syndrome   Diverticulitis   Discharge Condition: Improved.  Diet recommendation: Low sodium, heart healthy.    Wound care: None.  Code status: Full.   History of Present Illness:   [name], is a 62 years old female with past medical history of hypertension, hypothyroidism, seizure disorder, obstructive sleep apnea who was recently  seen in [facility] on 03/14/23 and diagnosed with diverticulitis was sent home on oral Augmentin but continued to have abdominal pain nausea , abdominal pain and symptoms so she presented to the ED again for reevaluation.  In the ED, vitals were stable.  Abdomen was tender to palpation.  Labs showed WBC at 8.0, CT scan of the abdomen pelvis showed increased bowel thickening of the descending and sigmoid colon with increased stranding.  Patient also complained of constipation for more than 2 weeks.  Patient was then admitted hospital for further evaluation and treatment.   Hospital Course:   Following conditions were addressed during hospitalization as listed below,  Constipation No reported bowel movements for 18 days prior to presentation.  Did have a bowel movement on 03/21/2023.  Does not wish to continue MiraLAX.  Received a suppository.  Patient was advised to avoid constipation at home and remain on bowel regimen.   Acute diverticulitis Taken 5-day  course of Augmentin as outpatient.  CT revealed worsening diverticulitis with increased bowel thickening and stranding w/o perforation.  She received IV cefepime and Flagyl in hospitalization and has improved at this time.  No fever or leukocytosis on presentation and pain has significantly improved without any need for pain medication.  At this time diet has been advanced.  Patient feels better and wishes to go home.  Patient was advised to continue her home medication regimen of Augmentin for next 5 days to complete the course.  She stated that she did have a colonoscopy and couple of years back but was advised to follow-up with her primary care physician.  Severe obstructive sleep apnea-hypopnea syndrome Continue CPAP at nighttime.   Hypothyroidism TSH 1 year ago 0.58.  Current TSH at 7.1.  On Synthroid.  Commend follow-up with PCP in the next visit to adjust medication if necessary.  Essential hypertension Elevated blood pressure on presentation.  Improved.  On spironolactone.    Hypomagnesemia.  Improved after replacement.  Latest magnesium of 1.8.   Disposition.  At this time, patient is stable for disposition home with outpatient PCP follow-up.  Medical Consultants:   None.  Procedures:    None Subjective:   Today, patient was seen and examined at bedside.  Denies any nausea vomiting diarrhea.  Feels that her pain has significantly improved.  Has been able to tolerate oral diet.  Discharge Exam:   Vitals:   03/22/23 0405 03/22/23 0956  BP: 139/75 (!) 165/84  Pulse: 77 73  Resp: 17 19  Temp: 97.7 F (36.5 C) 98.2 F (36.8 C)  SpO2: 98% 100%   Vitals:  03/21/23 1542 03/21/23 1939 03/22/23 0405 03/22/23 0956  BP: (!) 134/59 (!) 162/85 139/75 (!) 165/84  Pulse: 78 82 77 73  Resp: 16 18 17 19   Temp: 98.2 F (36.8 C) 98.4 F (36.9 C) 97.7 F (36.5 C) 98.2 F (36.8 C)  TempSrc: Oral Oral Oral Oral  SpO2: 100% 100% 98% 100%  Weight:      Height:       Body mass  index is 29.85 kg/m.  General: Alert awake, not in obvious distress HENT: pupils equally reacting to light,  No scleral pallor or icterus noted. Oral mucosa is moist.  Chest:  Clear breath sounds.  Diminished breath sounds bilaterally. No crackles or wheezes.  CVS: S1 &S2 heard. No murmur.  Regular rate and rhythm. Abdomen: Soft, nontender, nondistended.  Bowel sounds are heard.   Extremities: No cyanosis, clubbing or edema.  Peripheral pulses are palpable. Psych: Alert, awake and oriented, normal mood CNS:  No cranial nerve deficits.  Power equal in all extremities.   Skin: Warm and dry.  No rashes noted.  The results of significant diagnostics from this hospitalization (including imaging, microbiology, ancillary and laboratory) are listed below for reference.     Diagnostic Studies:   CT Abdomen Pelvis W Contrast  Result Date: 03/19/2023 CLINICAL DATA:  Left lower quadrant pain. EXAM: CT ABDOMEN AND PELVIS WITH CONTRAST TECHNIQUE: Multidetector CT imaging of the abdomen and pelvis was performed using the standard protocol following bolus administration of intravenous contrast. RADIATION DOSE REDUCTION: This exam was performed according to the departmental dose-optimization program which includes automated exposure control, adjustment of the mA and/or kV according to patient size and/or use of iterative reconstruction technique. CONTRAST:  OMNIPAQUE IOHEXOL 300 MG/ML  SOLN COMPARISON:  CT abdomen pelvis 03/14/2023 FINDINGS: Lower chest: Normal heart size. Dependent atelectasis lung bases bilaterally. No pleural effusion. Hepatobiliary: Liver is normal in size and contour. No focal hepatic lesion. Prior cholecystectomy. Pancreas: Unremarkable Spleen: Unremarkable Adrenals/Urinary Tract: Normal adrenal glands. Kidneys enhance symmetrically with contrast. No hydronephrosis. There is a 2.3 cm exophytic simple cyst midpole right kidney. No imaging follow-up needed. Stomach/Bowel: There is  circumferential wall thickening of the descending and sigmoid colon with small amount of pericolonic fluid particularly within the left pericolic gutter, which has progressed when compared to recent prior CT. There is a longer segment colonic thickening which starts in the proximal descending colon, increased from prior. Sigmoid colonic diverticulosis. Stool throughout the colon. No evidence for small bowel obstruction. No free intraperitoneal air. Small hiatal hernia. Normal morphology of the stomach. Vascular/Lymphatic: Normal caliber abdominal aorta. No retroperitoneal lymphadenopathy. Reproductive: Prior hysterectomy.  No pelvic masses. Other: None. Musculoskeletal: Lower thoracic and lumbar spine degenerative changes. Bilateral hip arthroplasties. IMPRESSION: 1. When compared to recent prior CT there has been increased circumferential wall thickening of the descending and sigmoid colon. The wall thickening starts more proximally near the splenic flexure. There is increased pericolonic fat stranding and fluid within the left pericolic gutter. Findings may represent worsening diverticulitis or colitis. After resolution of the acute symptomatology, if the patient has not had recent colonoscopy, this is recommended. Electronically Signed   By: Annia Belt M.D.   On: 03/19/2023 12:21     Labs:   Basic Metabolic Panel: Recent Labs  Lab 03/19/23 1512 03/20/23 0056 03/21/23 0048 03/22/23 0018  NA 136 139 137 135  K 3.8 4.3 3.8 3.9  CL 100 104 104 104  CO2 29 28 26 25   GLUCOSE 144* 95 96  93  BUN 14 13 11 13   CREATININE 0.81 0.87 0.80 0.85  CALCIUM 8.9 8.8* 8.8* 8.2*  MG  --   --  1.6* 1.8   GFR Estimated Creatinine Clearance: 76.8 mL/min (by C-G formula based on SCr of 0.85 mg/dL). Liver Function Tests: Recent Labs  Lab 03/19/23 1512  AST 38  ALT 60*  ALKPHOS 76  BILITOT 0.4  PROT 6.6  ALBUMIN 3.5   Recent Labs  Lab 03/19/23 1512  LIPASE 44   No results for input(s): "AMMONIA" in  the last 168 hours. Coagulation profile No results for input(s): "INR", "PROTIME" in the last 168 hours.  CBC: Recent Labs  Lab 03/19/23 1512 03/21/23 0048 03/22/23 0018  WBC 8.1 8.2 7.7  HGB 13.9 12.0 10.9*  HCT 44.0 36.7 33.9*  MCV 86.8 85.2 84.5  PLT 312 272 265   Cardiac Enzymes: No results for input(s): "CKTOTAL", "CKMB", "CKMBINDEX", "TROPONINI" in the last 168 hours. BNP: Invalid input(s): "POCBNP" CBG: No results for input(s): "GLUCAP" in the last 168 hours. D-Dimer No results for input(s): "DDIMER" in the last 72 hours. Hgb A1c No results for input(s): "HGBA1C" in the last 72 hours. Lipid Profile No results for input(s): "CHOL", "HDL", "LDLCALC", "TRIG", "CHOLHDL", "LDLDIRECT" in the last 72 hours. Thyroid function studies Recent Labs    03/19/23 2216  TSH 7.196*   Anemia work up No results for input(s): "VITAMINB12", "FOLATE", "FERRITIN", "TIBC", "IRON", "RETICCTPCT" in the last 72 hours. Microbiology No results found for this or any previous visit (from the past 240 hour(s)).   Discharge Instructions:   Discharge Instructions     Call MD for:  persistant nausea and vomiting   Complete by: As directed    Call MD for:  severe uncontrolled pain   Complete by: As directed    Call MD for:  temperature >100.4   Complete by: As directed    Diet general   Complete by: As directed    Discharge instructions   Complete by: As directed    Complete the course of antibiotic that you have at home.  Follow-up with your primary care physician in 1 week.  Seek medical attention for worsening symptoms.   Increase activity slowly   Complete by: As directed       Allergies as of 03/22/2023       Reactions   Ciprofloxacin Other (See Comments)   Detatched muscles and tendons    Apple Juice    ALL form of apple  Chemical burn to mouth and gums   Gluten Meal Other (See Comments)   Gut Distress   Benzoin    Claritin [loratadine] Other (See Comments)   allergic  psychosis   Septra [sulfamethoxazole-trimethoprim] Rash        Medication List     TAKE these medications    albuterol 108 (90 Base) MCG/ACT inhaler Commonly known as: VENTOLIN HFA Inhale 2 puffs into the lungs every 6 (six) hours as needed for wheezing or shortness of breath.   amoxicillin-clavulanate 875-125 MG tablet Commonly known as: AUGMENTIN Take 1 tablet by mouth every 12 (twelve) hours for 10 days.   aspirin EC 81 MG tablet Take 1 tablet (81 mg total) by mouth 2 (two) times daily. To be taken after surgery to prevent blood clots   DULoxetine 30 MG capsule Commonly known as: CYMBALTA 2 BY MOUTH EVERY MORNING, 1 BY MOUTH EVERY AT NIGHT What changed: See the new instructions.   levothyroxine 125 MCG tablet Commonly known  as: SYNTHROID TAKE 1 TABLET BY MOUTH EVERY DAY BEFORE BREAKFAST What changed: See the new instructions.   lisinopril 10 MG tablet Commonly known as: ZESTRIL Take 10 mg by mouth at bedtime.   Magnesium 200 MG Tabs Take 200 mg by mouth daily.   montelukast 10 MG tablet Commonly known as: SINGULAIR TAKE 1 TABLET BY MOUTH EVERY DAY What changed: when to take this   niacin 50 MG tablet Take 50 mg by mouth at bedtime.   NON FORMULARY 1 each by Other route at bedtime. cpap   omeprazole 20 MG capsule Commonly known as: PRILOSEC Take 20 mg by mouth at bedtime.   ondansetron 8 MG disintegrating tablet Commonly known as: ZOFRAN-ODT Take 1 tablet (8 mg total) by mouth every 8 (eight) hours as needed for nausea or vomiting.   oxyCODONE-acetaminophen 5-325 MG tablet Commonly known as: PERCOCET/ROXICET Take 1 tablet by mouth every 6 (six) hours as needed for severe pain.   pyridOXINE 100 MG tablet Commonly known as: VITAMIN B6 Take 100 mg by mouth daily.   spironolactone 50 MG tablet Commonly known as: ALDACTONE TAKE 1 TABLET BY MOUTH EVERY DAY What changed: how much to take   Vitamin D3 50 MCG (2000 UT) Tabs Take 2,000 Units by mouth  daily.   Wegovy 2.4 MG/0.75ML Soaj Generic drug: Semaglutide-Weight Management Inject 2.4 mg into the skin once a week.        Follow-up Information     Everrett Coombe, DO Follow up in 1 week(s).   Specialty: Family Medicine Contact information: [address]  [address] [place] [address] [phone]                  Time coordinating discharge: 39 minutes  Signed:  Semisi Biela  [facility] 03/22/2023, 4:27 PM

## 2023-03-22 NOTE — Progress Notes (Signed)
Henrine Screws to be D/C'd  per MD order.  Discussed with the patient and all questions fully answered.  VSS, Skin clean, dry and intact without evidence of skin break down, no evidence of skin tears noted.  IV catheter discontinued intact. Site without signs and symptoms of complications. Dressing and pressure applied.  An After Visit Summary was printed and given to the patient. Patient received prescription.  D/c education completed with patient/family including follow up instructions, medication list, d/c activities limitations if indicated, with other d/c instructions as indicated by MD - patient able to verbalize understanding, all questions fully answered.   Patient instructed to return to ED, call 911, or call MD for any changes in condition.   Patient to be escorted via WC, and D/C home via private auto.

## 2023-03-22 NOTE — Plan of Care (Signed)

## 2023-03-23 ENCOUNTER — Telehealth: Payer: Self-pay

## 2023-03-23 NOTE — Transitions of Care (Post Inpatient/ED Visit) (Signed)
03/23/2023  Name: Christy Todd MRN: [id number] DOB: 1961/05/09  Today's TOC FU Call Status: Today's TOC FU Call Status:: Successful TOC FU Call Competed TOC FU Call Complete Date: 03/23/23  Transition Care Management Follow-up Telephone Call Date of Discharge: 03/22/23 Discharge Facility: [facility]) Type of Discharge: Inpatient Admission Primary Inpatient Discharge Diagnosis:: diverticulitis How have you been since you were released from the hospital?: Better Any questions or concerns?: No  Items Reviewed: Did you receive and understand the discharge instructions provided?: Yes Medications obtained,verified, and reconciled?: Yes (Medications Reviewed) Any new allergies since your discharge?: No Dietary orders reviewed?: Yes Do you have support at home?: Yes People in Home: spouse  Medications Reviewed Today: Medications Reviewed Today     Reviewed by Karena Addison, LPN (Licensed Practical Nurse) on 03/23/23 at 1125  Med List Status: <None>   Medication Order Taking? Sig Documenting Provider Last Dose Status Informant  albuterol (VENTOLIN HFA) 108 (90 Base) MCG/ACT inhaler [id number] Yes Inhale 2 puffs into the lungs every 6 (six) hours as needed for wheezing or shortness of breath. Waldon Merl, PA-C Taking Active Self  amoxicillin-clavulanate (AUGMENTIN) 875-125 MG tablet [id number] Yes Take 1 tablet by mouth every 12 (twelve) hours for 10 days. Linwood Dibbles, MD Taking Active Self  aspirin EC 81 MG tablet [id number] Yes Take 1 tablet (81 mg total) by mouth 2 (two) times daily. To be taken after surgery to prevent blood clots Cristie Hem, PA-C Taking Active Self  Cholecalciferol (VITAMIN D3) 50 MCG (2000 UT) TABS [id number] Yes Take 2,000 Units by mouth daily. [provider] Taking Active Self  DULoxetine (CYMBALTA) 30 MG capsule [id number] Yes 2 BY MOUTH EVERY MORNING, 1 BY MOUTH EVERY AT NIGHT  Patient taking differently: Take 30 mg by mouth See admin  instructions. 2 BY MOUTH EVERY MORNING, 1 BY MOUTH EVERY AT Iverson Alamin, Selena Batten, DO Taking Active Self  levothyroxine (SYNTHROID) 125 MCG tablet [id number] Yes TAKE 1 TABLET BY MOUTH EVERY DAY BEFORE BREAKFAST  Patient taking differently: Take 125 mcg by mouth at bedtime.   Everrett Coombe, DO Taking Active Self  lisinopril (ZESTRIL) 10 MG tablet [id number] Yes Take 10 mg by mouth at bedtime. [provider] Taking Active Self  Magnesium 200 MG TABS [id number] Yes Take 200 mg by mouth daily. [provider] Taking Active Self  montelukast (SINGULAIR) 10 MG tablet [id number] Yes TAKE 1 TABLET BY MOUTH EVERY DAY  Patient taking differently: Take 10 mg by mouth at bedtime.   Everrett Coombe, DO Taking Active Self  niacin 50 MG tablet [id number] Yes Take 50 mg by mouth at bedtime. [provider] Taking Active Self  NON FORMULARY [id number] Yes 1 each by Other route at bedtime. cpap [provider] Taking Active Self  omeprazole (PRILOSEC) 20 MG capsule [id number] Yes Take 20 mg by mouth at bedtime. [provider] Taking Active Self  ondansetron (ZOFRAN-ODT) 8 MG disintegrating tablet [id number] Yes Take 1 tablet (8 mg total) by mouth every 8 (eight) hours as needed for nausea or vomiting. Pokhrel, Laxman, MD Taking Active   oxyCODONE-acetaminophen (PERCOCET/ROXICET) 5-325 MG tablet [id number] Yes Take 1 tablet by mouth every 6 (six) hours as needed for severe pain. Linwood Dibbles, MD Taking Active Self  pyridOXINE (VITAMIN B6) 100 MG tablet [id number] Yes Take 100 mg by mouth daily. [provider] Taking Active Self  spironolactone (ALDACTONE) 50 MG tablet [id number] Yes TAKE 1 TABLET BY MOUTH EVERY DAY  Patient taking  differently: Take 20 mg by mouth daily.   Everrett Coombe, DO Taking Active Self  WEGOVY 2.4 MG/0.75ML Ivory Broad [id number] Yes Inject 2.4 mg into the skin once a week. Everrett Coombe, DO Taking Active Self            Home Care and  Equipment/Supplies: Were Home Health Services Ordered?: NA Any new equipment or medical supplies ordered?: NA  Functional Questionnaire: Do you need assistance with bathing/showering or dressing?: No Do you need assistance with meal preparation?: No Do you need assistance with eating?: No Do you have difficulty maintaining continence: No Do you need assistance with getting out of bed/getting out of a chair/moving?: No Do you have difficulty managing or taking your medications?: No  Follow up appointments reviewed: PCP Follow-up appointment confirmed?: Yes Date of PCP follow-up appointment?: 03/31/23 Follow-up Provider: Dr Ashley Royalty [facility] Follow-up appointment confirmed?: NA Do you need transportation to your follow-up appointment?: No Do you understand care options if your condition(s) worsen?: Yes-patient verbalized understanding    SIGNATURE Karena Addison, LPN [facility] Nurse Health Advisor Direct Dial [phone]

## 2023-03-25 ENCOUNTER — Other Ambulatory Visit: Payer: Self-pay | Admitting: Family Medicine

## 2023-03-25 DIAGNOSIS — J4530 Mild persistent asthma, uncomplicated: Secondary | ICD-10-CM

## 2023-03-31 ENCOUNTER — Ambulatory Visit (INDEPENDENT_AMBULATORY_CARE_PROVIDER_SITE_OTHER): Payer: Managed Care, Other (non HMO) | Admitting: Family Medicine

## 2023-03-31 ENCOUNTER — Encounter: Payer: Self-pay | Admitting: Family Medicine

## 2023-03-31 DIAGNOSIS — E038 Other specified hypothyroidism: Secondary | ICD-10-CM | POA: Diagnosis not present

## 2023-03-31 DIAGNOSIS — I10 Essential (primary) hypertension: Secondary | ICD-10-CM

## 2023-03-31 DIAGNOSIS — K5792 Diverticulitis of intestine, part unspecified, without perforation or abscess without bleeding: Secondary | ICD-10-CM

## 2023-03-31 DIAGNOSIS — E063 Autoimmune thyroiditis: Secondary | ICD-10-CM

## 2023-03-31 LAB — CBC WITH DIFFERENTIAL/PLATELET
Basophils Absolute: 86 cells/uL (ref 0–200)
Eosinophils Absolute: 507 cells/uL — ABNORMAL HIGH (ref 15–500)
Eosinophils Relative: 6.5 %
HCT: 37.9 % (ref 35.0–45.0)
MCHC: 32.2 g/dL (ref 32.0–36.0)

## 2023-03-31 NOTE — Assessment & Plan Note (Signed)
Update magnesium levels 

## 2023-03-31 NOTE — Assessment & Plan Note (Signed)
Essentially resolved at this time.  Bowels are moving better.  Update labs today.

## 2023-03-31 NOTE — Assessment & Plan Note (Addendum)
Tsh elevated recently, rechecking TSH today.

## 2023-03-31 NOTE — Progress Notes (Signed)
Christy Todd - 62 y.o. female MRN [id number]  Date of birth: 1960/12/04  Subjective Chief Complaint  Patient presents with   Hospitalization Follow-up    HPI Christy Todd is a 62 y.o. female here today for hospital follow up.    She was dx with diverticulitis however continued to have pain after completion of Augmentin.  She was unable to have bowel movement after nearly 2 weeks as well.  CT with worsening diverticulitis/colitis.  Admitted for failure of outpatient treatment of diverticulitis.  Treated with cefepime and flagyl with improvement.  Sent home to complete course of augmentin.  She reports that she is feeling much better today and that her bowels are moving better, still with some constipation.  No blood in her stool.    TSH was elevated previously however was ill at that time.   She is wanting to resume Wegovy but doesn't want to restart at the 2.4mg  strength she was taking previously.    ROS:  A comprehensive ROS was completed and negative except as noted per HPI  Allergies  Allergen Reactions   Ciprofloxacin Other (See Comments)    Detatched muscles and tendons    Apple Juice     ALL form of apple  Chemical burn to mouth and gums   Gluten Meal Other (See Comments)    Gut Distress   Benzoin    Claritin [Loratadine] Other (See Comments)    allergic psychosis   Septra [Sulfamethoxazole-Trimethoprim] Rash    Past Medical History:  Diagnosis Date   Arthritis    Asthma    COVID-19 07/23/2021   Depression    pt states she does not have depression. Cymbalta is used for pain control   Factor 5 Leiden mutation, heterozygous (HCC)    GERD (gastroesophageal reflux disease)    Hypertension    Hypothyroid    Pre-diabetes    pt states she is no longer pre-diabetic due to weight loss   Sleep apnea    on CPAP   Wears hearing aid in right ear     Past Surgical History:  Procedure Laterality Date   ABDOMINAL HYSTERECTOMY     ANKLE RECONSTRUCTION      CHOLECYSTECTOMY     COLONOSCOPY  12/09/2018   [facility].  3 mm sessile polyp, hepatic flexure. Mild - Moderate diverticulosis, sigmoid colon.   CYSTOCELE REPAIR     ESOPHAGOGASTRODUODENOSCOPY  12/09/2018   [facility]. Normal upper GI tract   RECTOCELE REPAIR     TENDON REPAIR     TOTAL ABDOMINAL HYSTERECTOMY     TOTAL HIP ARTHROPLASTY Right 08/25/2021   Procedure: RIGHT TOTAL HIP ARTHROPLASTY ANTERIOR APPROACH;  Surgeon: Tarry Kos, MD;  Location: [facility] OR;  Service: Orthopedics;  Laterality: Right;  3-C   TOTAL HIP ARTHROPLASTY Left 04/06/2022   Procedure: LEFT TOTAL HIP ARTHROPLASTY ANTERIOR APPROACH;  Surgeon: Tarry Kos, MD;  Location: [facility] OR;  Service: Orthopedics;  Laterality: Left;   TUBAL LIGATION      Social History   Socioeconomic History   Marital status: Married    Spouse name: Dymone Horsfall   Number of children: 2   Years of education: Not on file   Highest education level: 12th grade  Occupational History   Not on file  Tobacco Use   Smoking status: Never   Smokeless tobacco: Never  Vaping Use   Vaping Use: Never used  Substance and Sexual Activity  Alcohol use: Not Currently   Drug use: Never   Sexual activity: Yes    Partners: Male  Other Topics Concern   Not on file  Social History Narrative   Not on file   Social Determinants of Health   Financial Resource Strain: Low Risk  (03/19/2023)   Overall Financial Resource Strain (CARDIA)    Difficulty of Paying Living Expenses: Not hard at all  Food Insecurity: No Food Insecurity (03/19/2023)   Hunger Vital Sign    Worried About Running Out of Food in the Last Year: Never true    Ran Out of Food in the Last Year: Never true  Transportation Needs: No Transportation Needs (03/19/2023)   PRAPARE - Administrator, Civil Service (Medical): No    Lack of Transportation (Non-Medical): No  Physical Activity: Sufficiently  Active (03/19/2023)   Exercise Vital Sign    Days of Exercise per Week: 5 days    Minutes of Exercise per Session: 80 min  Stress: Stress Concern Present (03/19/2023)   [employer] of Occupational Health - Occupational Stress Questionnaire    Feeling of Stress : To some extent  Social Connections: Socially Integrated (03/19/2023)   Social Connection and Isolation Panel [NHANES]    Frequency of Communication with Friends and Family: Three times a week    Frequency of Social Gatherings with Friends and Family: More than three times a week    Attends Religious Services: More than 4 times per year    Active Member of Clubs or Organizations: Yes    Attends Banker Meetings: 1 to 4 times per year    Marital Status: Married    Family History  Problem Relation Age of Onset   Hypertension Father    Pancreatic cancer Father    Diabetes Maternal Grandmother    Colon cancer Neg Hx    Stomach cancer Neg Hx    Esophageal cancer Neg Hx     Health Maintenance  Topic Date Due   Hepatitis C Screening  Never done   MAMMOGRAM  01/22/2024 (Originally 07/10/2012)   COVID-19 Vaccine (5 - 2023-24 season) 02/07/2024 (Originally 07/03/2022)   INFLUENZA VACCINE  06/03/2023   DTaP/Tdap/Td (2 - Td or Tdap) 08/27/2030   Colonoscopy  07/05/2031   HIV Screening  Completed   Zoster Vaccines- Shingrix  Completed   HPV VACCINES  Aged Out   PAP SMEAR-Modifier  Discontinued     ----------------------------------------------------------------------------------------------------------------------------------------------------------------------------------------------------------------- Physical Exam BP 124/81 (BP Location: Left Arm, Patient Position: Sitting, Cuff Size: Large)   Pulse 85   Ht 5\' 6"  (1.676 m)   Wt 184 lb (83.5 kg)   LMP 11/02/1993   SpO2 97%   BMI 29.70 kg/m   Physical Exam Constitutional:      Appearance: Normal appearance.  HENT:     Head: Normocephalic and atraumatic.   Eyes:     General: No scleral icterus. Musculoskeletal:     Cervical back: Neck supple.  Neurological:     Mental Status: She is alert.  Psychiatric:        Mood and Affect: Mood normal.        Behavior: Behavior normal.     ------------------------------------------------------------------------------------------------------------------------------------------------------------------------------------------------------------------- Assessment and Plan  Diverticulitis Essentially resolved at this time.  Bowels are moving better.  Update labs today.   Hypothyroidism Tsh elevated recently, rechecking TSH today.   Hypomagnesemia Update magnesium levels.    No orders of the defined types were placed in this encounter.   No follow-ups  on file.    This visit occurred during the SARS-CoV-2 public health emergency.  Safety protocols were in place, including screening questions prior to the visit, additional usage of staff PPE, and extensive cleaning of exam room while observing appropriate contact time as indicated for disinfecting solutions.

## 2023-04-01 LAB — CBC WITH DIFFERENTIAL/PLATELET
Absolute Monocytes: 460 cells/uL (ref 200–950)
Basophils Relative: 1.1 %
Hemoglobin: 12.2 g/dL (ref 11.7–15.5)
Lymphs Abs: 2012 cells/uL (ref 850–3900)
MCH: 27.2 pg (ref 27.0–33.0)
MCV: 84.4 fL (ref 80.0–100.0)
MPV: 10.8 fL (ref 7.5–12.5)
Monocytes Relative: 5.9 %
Neutro Abs: 4735 cells/uL (ref 1500–7800)
Neutrophils Relative %: 60.7 %
Platelets: 352 10*3/uL (ref 140–400)
RBC: 4.49 10*6/uL (ref 3.80–5.10)
RDW: 12.9 % (ref 11.0–15.0)
Total Lymphocyte: 25.8 %
WBC: 7.8 10*3/uL (ref 3.8–10.8)

## 2023-04-01 LAB — COMPLETE METABOLIC PANEL WITH GFR
AG Ratio: 1.6 (calc) (ref 1.0–2.5)
ALT: 24 U/L (ref 6–29)
AST: 16 U/L (ref 10–35)
Albumin: 3.9 g/dL (ref 3.6–5.1)
Alkaline phosphatase (APISO): 69 U/L (ref 37–153)
BUN: 22 mg/dL (ref 7–25)
CO2: 30 mmol/L (ref 20–32)
Calcium: 9.1 mg/dL (ref 8.6–10.4)
Chloride: 104 mmol/L (ref 98–110)
Creat: 0.83 mg/dL (ref 0.50–1.05)
Globulin: 2.4 g/dL (calc) (ref 1.9–3.7)
Glucose, Bld: 86 mg/dL (ref 65–99)
Potassium: 3.8 mmol/L (ref 3.5–5.3)
Sodium: 140 mmol/L (ref 135–146)
Total Bilirubin: 0.3 mg/dL (ref 0.2–1.2)
Total Protein: 6.3 g/dL (ref 6.1–8.1)
eGFR: 81 mL/min/{1.73_m2} (ref >=60)

## 2023-04-01 LAB — TSH: TSH: 2.24 mIU/L (ref 0.40–4.50)

## 2023-04-01 LAB — MAGNESIUM: Magnesium: 1.7 mg/dL (ref 1.5–2.5)

## 2023-04-02 ENCOUNTER — Ambulatory Visit (INDEPENDENT_AMBULATORY_CARE_PROVIDER_SITE_OTHER): Payer: Managed Care, Other (non HMO) | Admitting: Orthopaedic Surgery

## 2023-04-02 ENCOUNTER — Encounter: Payer: Self-pay | Admitting: Family Medicine

## 2023-04-02 ENCOUNTER — Other Ambulatory Visit (INDEPENDENT_AMBULATORY_CARE_PROVIDER_SITE_OTHER): Payer: Managed Care, Other (non HMO)

## 2023-04-02 DIAGNOSIS — Z96642 Presence of left artificial hip joint: Secondary | ICD-10-CM | POA: Diagnosis not present

## 2023-04-02 NOTE — Progress Notes (Signed)
Post-Op Visit Note   Patient: Christy Todd           Date of Birth: 1961/07/25           MRN: [id number] Visit Date: 04/02/2023 PCP: Everrett Coombe, DO   Assessment & Plan:  Chief Complaint:  Chief Complaint  Patient presents with   Left Hip - Routine Post Op   Visit Diagnoses:  1. Status post total replacement of left hip     Plan: Patient is a pleasant 62 year old female who comes in today 1 year status post left anterior total hip replacement 04/06/2022.  She has been doing great.  She recently started taking turmeric which has significantly helped.  Examination of her left hip reveals painless hip flexion and logroll.  She is neurovascular intact distally.  At this point, she will continue to advance with activity as tolerated.  Dental prophylaxis reinforced.  Follow-up in 1 year for repeat evaluation and AP pelvis x-rays.  Call with concerns or questions.  Follow-Up Instructions: Return in about 1 year (around 04/01/2024).   Orders:  Orders Placed This Encounter  Procedures   XR Pelvis 1-2 Views   No orders of the defined types were placed in this encounter.   Imaging: XR Pelvis 1-2 Views  Result Date: 04/02/2023 Well-seated prosthesis without complication   PMFS History: Patient Active Problem List   Diagnosis Date Noted   Hypomagnesemia 03/31/2023   Diverticulitis 03/19/2023   Constipation 03/19/2023   Abnormal weight gain 01/23/2023   Dyspnea 09/04/2022   Chest pain 07/06/2022   Status post total replacement of left hip 04/06/2022   Clavicle pain 02/13/2022   Primary osteoarthritis of left hip 11/21/2021   Orthostatic hypotension 09/22/2021   Dizziness 09/22/2021   Weight loss 09/22/2021   Status post total replacement of right hip 08/25/2021   Morbid obesity (HCC) 11/14/2020   Sleep behavior disorder, 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   History of seizure 07/02/2020   Obesity with alveolar hypoventilation and body mass index (BMI) of 40 or greater (HCC) 07/02/2020    Severe obstructive sleep apnea-hypopnea syndrome 07/02/2020   OSA on CPAP 07/02/2020   Essential hypertension 05/07/2020   Chronic ankle pain 05/07/2020   Degenerative joint disease (DJD) of hip 05/07/2020   Seizure disorder (HCC) 05/07/2020   Hypothyroidism 05/07/2020   Mild persistent asthma 05/07/2020   Past Medical History:  Diagnosis Date   Arthritis    Asthma    COVID-19 07/23/2021   Depression    pt states she does not have depression. Cymbalta is used for pain control   Factor 5 Leiden mutation, heterozygous (HCC)    GERD (gastroesophageal reflux disease)    Hypertension    Hypothyroid    Pre-diabetes    pt states she is no longer pre-diabetic due to weight loss   Sleep apnea    on CPAP   Wears hearing aid in right ear     Family History  Problem Relation Age of Onset   Hypertension Father    Pancreatic cancer Father    Diabetes Maternal Grandmother    Colon cancer Neg Hx    Stomach cancer Neg Hx    Esophageal cancer Neg Hx     Past Surgical History:  Procedure Laterality Date   ABDOMINAL HYSTERECTOMY     ANKLE RECONSTRUCTION     CHOLECYSTECTOMY     COLONOSCOPY  12/09/2018   [facility].  3 mm sessile polyp, hepatic flexure. Mild - Moderate diverticulosis, sigmoid  colon.   CYSTOCELE REPAIR     ESOPHAGOGASTRODUODENOSCOPY  12/09/2018   [facility]. Normal upper GI tract   RECTOCELE REPAIR     TENDON REPAIR     TOTAL ABDOMINAL HYSTERECTOMY     TOTAL HIP ARTHROPLASTY Right 08/25/2021   Procedure: RIGHT TOTAL HIP ARTHROPLASTY ANTERIOR APPROACH;  Surgeon: Tarry Kos, MD;  Location: [facility] OR;  Service: Orthopedics;  Laterality: Right;  3-C   TOTAL HIP ARTHROPLASTY Left 04/06/2022   Procedure: LEFT TOTAL HIP ARTHROPLASTY ANTERIOR APPROACH;  Surgeon: Tarry Kos, MD;  Location: [facility] OR;  Service: Orthopedics;  Laterality: Left;   TUBAL LIGATION     Social History   Occupational History    Not on file  Tobacco Use   Smoking status: Never   Smokeless tobacco: Never  Vaping Use   Vaping Use: Never used  Substance and Sexual Activity   Alcohol use: Not Currently   Drug use: Never   Sexual activity: Yes    Partners: Male

## 2023-05-10 ENCOUNTER — Telehealth: Payer: Self-pay

## 2023-05-10 NOTE — Telephone Encounter (Signed)
Contacted pt due to not having CPAP data online since 01/21/23. Pt stated she had to return her machine due to it not functioning properly and they gave her a loner. She would either have to come to office or DME for manual DL. She stated she will try Friday to get DME to DL and fax to [place] and will call back to reschedule .

## 2023-05-11 ENCOUNTER — Telehealth: Payer: Managed Care, Other (non HMO) | Admitting: Adult Health

## 2023-06-15 ENCOUNTER — Telehealth: Payer: Self-pay | Admitting: Urology

## 2023-06-15 NOTE — Telephone Encounter (Signed)
DOS - 07/16/23  METATARSAL HEAD RESECTION 5TH LEFT --- 16109 WEBBING PROCEDURE LEFT --- 28280  [employer]   PER [employer]'S AUTOMATED SYSTEM FOR CPT CODES 60454 AND 28280 NO PRIOR AUTH IS REQUIRED.  CALL REF # [id number] CALL REF # [id number]

## 2023-06-18 ENCOUNTER — Other Ambulatory Visit: Payer: Self-pay | Admitting: Family Medicine

## 2023-06-22 ENCOUNTER — Other Ambulatory Visit: Payer: Self-pay | Admitting: Family Medicine

## 2023-07-06 ENCOUNTER — Other Ambulatory Visit: Payer: Self-pay | Admitting: Family Medicine

## 2023-07-08 ENCOUNTER — Telehealth: Payer: Self-pay | Admitting: Podiatry

## 2023-07-08 NOTE — Telephone Encounter (Signed)
PATIENT CALLED TO CANCEL SURGERY. REASON: PATIENT HAS COVID. ADVISED DR [facility] AND [facility]

## 2023-07-22 ENCOUNTER — Encounter: Payer: Managed Care, Other (non HMO) | Admitting: Podiatry

## 2023-08-05 ENCOUNTER — Encounter: Payer: Managed Care, Other (non HMO) | Admitting: Podiatry

## 2023-08-06 ENCOUNTER — Ambulatory Visit (INDEPENDENT_AMBULATORY_CARE_PROVIDER_SITE_OTHER): Payer: Managed Care, Other (non HMO) | Admitting: Family Medicine

## 2023-08-06 ENCOUNTER — Encounter: Payer: Self-pay | Admitting: Family Medicine

## 2023-08-06 VITALS — BP 112/66 | HR 84 | Ht 66.0 in | Wt 190.0 lb

## 2023-08-06 DIAGNOSIS — R4789 Other speech disturbances: Secondary | ICD-10-CM

## 2023-08-06 DIAGNOSIS — Z23 Encounter for immunization: Secondary | ICD-10-CM

## 2023-08-06 MED ORDER — ZEPBOUND 10 MG/0.5ML ~~LOC~~ SOAJ: 10.0000 mg | SUBCUTANEOUS | 1 refills | Status: DC

## 2023-08-06 NOTE — Assessment & Plan Note (Signed)
Orders Placed This Encounter  Procedures   Flu vaccine trivalent PF, 6mos and older(Flulaval,Afluria,Fluarix,Fluzone)   B12   TSH   Vitamin B1  Recommend f/u with neurology as well.

## 2023-08-06 NOTE — Assessment & Plan Note (Signed)
She was doing great with Wegovy.  Doesn't seem to be as effective at this point.  Discussed change to Zepbound if her insurance will cover.

## 2023-08-06 NOTE — Progress Notes (Signed)
Christy Todd - 62 y.o. female MRN [id number]  Date of birth: 23-Mar-1961  Subjective Chief Complaint  Patient presents with   Weight Check    HPI Christy Todd is a 62 y.o. female here today for follow up.   She reports that she is doing pretty well.  Continues on Wegovy.  Feels that current strength isn't as effective at managing her appetite.  Currently at 2.4mg /weekly.  Exercise is decreased recently.  Her goal would be to get back to around 175, felt good at this weight.   Also with increased difficulty with word finding.  She does see neurology for history of seizures but has only briefly discussed this with them.  She finds this quite frustrating.  She is taking B12 supplement and is compliant with thyroid medication.     ROS:  A comprehensive ROS was completed and negative except as noted per HPI  Allergies  Allergen Reactions   Ciprofloxacin Other (See Comments)    Detatched muscles and tendons    Apple Juice     ALL form of apple  Chemical burn to mouth and gums   Gluten Meal Other (See Comments)    Gut Distress   Benzoin    Claritin [Loratadine] Other (See Comments)    allergic psychosis   Septra [Sulfamethoxazole-Trimethoprim] Rash    Past Medical History:  Diagnosis Date   Arthritis    Asthma    COVID-19 07/23/2021   Depression    pt states she does not have depression. Cymbalta is used for pain control   Factor 5 Leiden mutation, heterozygous (HCC)    GERD (gastroesophageal reflux disease)    Hypertension    Hypothyroid    Pre-diabetes    pt states she is no longer pre-diabetic due to weight loss   Sleep apnea    on CPAP   Wears hearing aid in right ear     Past Surgical History:  Procedure Laterality Date   ABDOMINAL HYSTERECTOMY     ANKLE RECONSTRUCTION     CHOLECYSTECTOMY     COLONOSCOPY  12/09/2018   [facility].  3 mm sessile polyp, hepatic flexure. Mild - Moderate diverticulosis, sigmoid colon.    CYSTOCELE REPAIR     ESOPHAGOGASTRODUODENOSCOPY  12/09/2018   [facility]. Normal upper GI tract   RECTOCELE REPAIR     TENDON REPAIR     TOTAL ABDOMINAL HYSTERECTOMY     TOTAL HIP ARTHROPLASTY Right 08/25/2021   Procedure: RIGHT TOTAL HIP ARTHROPLASTY ANTERIOR APPROACH;  Surgeon: Tarry Kos, MD;  Location: [facility] OR;  Service: Orthopedics;  Laterality: Right;  3-C   TOTAL HIP ARTHROPLASTY Left 04/06/2022   Procedure: LEFT TOTAL HIP ARTHROPLASTY ANTERIOR APPROACH;  Surgeon: Tarry Kos, MD;  Location: [facility] OR;  Service: Orthopedics;  Laterality: Left;   TUBAL LIGATION      Social History   Socioeconomic History   Marital status: Married    Spouse name: Verma Grothaus   Number of children: 2   Years of education: Not on file   Highest education level: 12th grade  Occupational History   Not on file  Tobacco Use   Smoking status: Never   Smokeless tobacco: Never  Vaping Use   Vaping status: Never Used  Substance and Sexual Activity   Alcohol use: Not Currently   Drug use: Never   Sexual activity: Yes    Partners: Male  Other Topics Concern   Not on  file  Social History Narrative   Not on file   Social Determinants of Health   Financial Resource Strain: Low Risk  (03/19/2023)   Overall Financial Resource Strain (CARDIA)    Difficulty of Paying Living Expenses: Not hard at all  Food Insecurity: No Food Insecurity (03/19/2023)   Hunger Vital Sign    Worried About Running Out of Food in the Last Year: Never true    Ran Out of Food in the Last Year: Never true  Transportation Needs: No Transportation Needs (03/19/2023)   PRAPARE - Administrator, Civil Service (Medical): No    Lack of Transportation (Non-Medical): No  Physical Activity: Sufficiently Active (03/19/2023)   Exercise Vital Sign    Days of Exercise per Week: 5 days    Minutes of Exercise per Session: 80 min  Stress: Stress Concern Present (03/19/2023)   [employer] of Occupational Health - Occupational Stress Questionnaire    Feeling of Stress : To some extent  Social Connections: Unknown (07/18/2023)   Received from [facility]   Social Network    Social Network: Not on file    Family History  Problem Relation Age of Onset   Hypertension Father    Pancreatic cancer Father    Diabetes Maternal Grandmother    Colon cancer Neg Hx    Stomach cancer Neg Hx    Esophageal cancer Neg Hx     Health Maintenance  Topic Date Due   Hepatitis C Screening  Never done   COVID-19 Vaccine (5 - 2023-24 season) 07/04/2023   MAMMOGRAM  01/22/2024 (Originally 07/10/2012)   DTaP/Tdap/Td (2 - Td or Tdap) 08/27/2030   Colonoscopy  07/05/2031   INFLUENZA VACCINE  Completed   HIV Screening  Completed   Zoster Vaccines- Shingrix  Completed   HPV VACCINES  Aged Out     ----------------------------------------------------------------------------------------------------------------------------------------------------------------------------------------------------------------- Physical Exam BP 112/66   Pulse 84   Ht 5\' 6"  (1.676 m)   Wt 190 lb (86.2 kg)   LMP 11/02/1993   SpO2 100%   BMI 30.67 kg/m   Physical Exam Constitutional:      Appearance: Normal appearance.  HENT:     Head: Normocephalic and atraumatic.  Eyes:     General: No scleral icterus. Cardiovascular:     Rate and Rhythm: Normal rate and regular rhythm.  Pulmonary:     Effort: Pulmonary effort is normal.     Breath sounds: Normal breath sounds.  Musculoskeletal:     Cervical back: Neck supple.  Neurological:     Mental Status: She is alert.  Psychiatric:        Mood and Affect: Mood normal.        Behavior: Behavior normal.     ------------------------------------------------------------------------------------------------------------------------------------------------------------------------------------------------------------------- Assessment and Plan  Morbid  obesity (HCC) She was doing great with Wegovy.  Doesn't seem to be as effective at this point.  Discussed change to Zepbound if her insurance will cover.    Word finding difficulty Orders Placed This Encounter  Procedures   Flu vaccine trivalent PF, 6mos and older(Flulaval,Afluria,Fluarix,Fluzone)   B12   TSH   Vitamin B1  Recommend f/u with neurology as well.    Meds ordered this encounter  Medications   tirzepatide (ZEPBOUND) 10 MG/0.5ML Pen    Sig: Inject 10 mg into the skin once a week.    Dispense:  2 mL    Refill:  1    Return in about 4 months (around 12/07/2023) for Weight mgmt.Marland Kitchen  This visit occurred during the SARS-CoV-2 public health emergency.  Safety protocols were in place, including screening questions prior to the visit, additional usage of staff PPE, and extensive cleaning of exam room while observing appropriate contact time as indicated for disinfecting solutions.

## 2023-08-11 LAB — VITAMIN B1: Thiamine: 116.8 nmol/L (ref 66.5–200.0)

## 2023-08-11 LAB — VITAMIN B12: Vitamin B-12: 300 pg/mL (ref 232–1245)

## 2023-08-11 LAB — TSH: TSH: 0.68 u[IU]/mL (ref 0.450–4.500)

## 2023-08-26 ENCOUNTER — Encounter: Payer: Managed Care, Other (non HMO) | Admitting: Podiatry

## 2023-09-03 ENCOUNTER — Telehealth: Payer: Self-pay

## 2023-09-09 MED ORDER — ZEPBOUND 10 MG/0.5ML ~~LOC~~ SOAJ: 10.0000 mg | SUBCUTANEOUS | 1 refills | Status: DC

## 2023-09-09 NOTE — Telephone Encounter (Signed)
Refill sent.

## 2023-09-15 ENCOUNTER — Ambulatory Visit (INDEPENDENT_AMBULATORY_CARE_PROVIDER_SITE_OTHER): Payer: Managed Care, Other (non HMO) | Admitting: Neurology

## 2023-09-15 ENCOUNTER — Encounter: Payer: Self-pay | Admitting: Neurology

## 2023-09-15 VITALS — BP 125/78 | HR 76 | Ht 66.0 in | Wt 186.0 lb

## 2023-09-15 DIAGNOSIS — G4733 Obstructive sleep apnea (adult) (pediatric): Secondary | ICD-10-CM | POA: Diagnosis not present

## 2023-09-15 DIAGNOSIS — R634 Abnormal weight loss: Secondary | ICD-10-CM | POA: Diagnosis not present

## 2023-09-15 NOTE — Progress Notes (Signed)
Provider:  Melvyn Novas, MD  Primary Care Physician:  Christy Coombe, DO [address] [address] [place] [address]     Referring Provider: Everrett Coombe, Do [address] [place],  [place] [address]          Chief Complaint according to patient   Patient presents with:                HISTORY OF PRESENT ILLNESS:  Christy Todd is a 62 y.o. female patient who is here for new problem visit t 09/15/2023 for  memory problems.  She has ben established here in the sleep clinic, has followed with Christy Penny, NP for CPAP but lost a lot of weight.  A total of 140 pounds. She continues to use CPAP compliantly, and has a residual AHI of 1.1/h.  she has not been retested since her weight loss.   Chief concern according to patient :  I am here today for memory testing.   This patient wears hearing aids and feels these serve her well. She can participate in conversation. Its the generation or words and names that gives her trouble.  Delayed recall.  Memory loss ? She is busy , watches 2 toddlers very day,  active lifestyle. Not a drinker, not a smoker,  she has bursitis- hip replacements.  she likes to have THC gummy which helps her sleep. Takes Curcuma for pain,   No family history of dementia.      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 08-01-2020:   Christy Todd's home sleep test dated 07-29-20 revealed an overnight AHI of  72.5 (apneas and hypopneas per hour) of sleep that is a very severe degree of apnea.  Unfortunately she lost the chest wall electrode or contact to it and so I do not find myself unable to tell her which position she slept there is no differentiation between non-REM and REM sleep but clearly with this degree of sleep apnea she would be dependent on positive airway pressure. She used to use BiPAP and now  has used for many years CPAP, yes and if her machine needs to be replaced it will need to be replaced 9 with.  She feels her current pressure CPAP of 12 cm water is not high enough. s for the same reason the patient is not fond of the RAMP feature.  I will order a nasal cradle for her -  Auto CPAP 10-17 cm water, 2 cm EPR. Heated humidification and heated tubing.    Number 2)  The patient had reported to Dr. Ashley Todd her new primary care physician that she has a history of generalized seizures was seeing a neurologist in [place], follow-up EEGs were negative.  She has not been on any antiseizure medications her daughter had mentioned to her that she sometimes  seems to stare off or be absent, so there has been a question if this could be a form of seizure activity she was also told by a pharmacist that 2 of the medications she is currently using may not going well together in terms of seizure threshold lowering effect:  Since 2016 on Cymbalta (2 in Am one in PM )  and Nortriptyline ( at bedtime) - this may lead to high BP peaks.     08/01/2020:  EEG -    The patient underwent a 33 minutes recording on 07-22-20.  Her EEG was performed in the International 10-20 placement of electrodes.  The patient was alert and cooperative.  A 8 Hz posterior dominant rhythm was noted on the lateral moderate amplitude and promptly attenuated with eye opening.  The patient's EEG showed signs of drowsiness while she was attempting hyperventilation.  The amplitude of the EEG increased, and there was a single 3 sharp wave discharge  between T3 and T5 noted which had no corresponding focus on the right brain.  However this was an isolated finding but did not repeat itself.  The patient became more drowsy after hyperventilation was concluded and actually reached sleep stages 1 and 2 briefly bifrontal slowing was noted photic stimulation was then implemented.  Bifrontal slowing was still noted but there was entrainment without any epileptiform activity at various frequencies of photic stimulation up to 20 Hz.  Following the conclusion of photic stimulation maneuver the patient actually entered deeper sleep.  Sleep activity appeared symmetric and reached NREM sleep stage II.    CONSULTATION on *-31-2021: Chief concern according to patient :   Christy Todd was residing in the state of [place], in [place], when she had her previous sleep study, and she has been a compliant CPAP user ever since her diagnosis.  Her current machine was issued in 2017 and is a Sports administrator.  She has never used a ozone emitting device to clean the machine and I think that she is definitely safe to use her Respironics machine until it can be replaced.  There is no urgency to replace it."   I have the pleasure of seeing Christy Todd today, a  right-handed  Caucasian female who  has a past medical history of Asthma, Hypertension, Hypothyroid, and Wears hearing aid in right ear. OSA on CPAP.    Review of Systems: Out of a complete 14 system review, the patient complains of only the following symptoms, and all other reviewed systems are negative.:  Fatigue, sleepiness , snoring, fragmented sleep, Insomnia, RLS,    How likely are you to doze in the following situations: 0 = not likely, 1 = slight chance, 2 = moderate chance, 3 = high chance   Sitting and Reading? Watching Television? Sitting inactive in a public place (theater or meeting)? As a passenger in a car for an hour without a break? Lying down in the afternoon when circumstances  permit? Sitting and talking to someone? Sitting quietly after lunch without alcohol? In a car, while stopped for a few minutes in traffic?   Total = 15/ 24 points - high air leak on CPAP  FSS endorsed at 30/ 63 points.   Social History   Socioeconomic History   Marital status: Married    Spouse name: Mariellen Hecimovich   Number of children: 2   Years of education: Not on file   Highest education level: 12th grade  Occupational History   Not on file  Tobacco  Use   Smoking status: Never   Smokeless tobacco: Never  Vaping Use   Vaping status: Never Used  Substance and Sexual Activity   Alcohol use: Not Currently   Drug use: Never   Sexual activity: Yes    Partners: Male  Other Topics Concern   Not on file  Social History Narrative   Not on file   Social Determinants of Health   Financial Resource Strain: Low Risk  (03/19/2023)   Overall Financial Resource Strain (CARDIA)    Difficulty of Paying Living Expenses: Not hard at all  Food Insecurity: No Food Insecurity (03/19/2023)   Hunger Vital Sign    Worried About Running Out of Food in the Last Year: Never true    Ran Out of Food in the Last Year: Never true  Transportation Needs: No Transportation Needs (03/19/2023)   PRAPARE - Administrator, Civil Service (Medical): No    Lack of Transportation (Non-Medical): No  Physical Activity: Sufficiently Active (03/19/2023)   Exercise Vital Sign    Days of Exercise per Week: 5 days    Minutes of Exercise per Session: 80 min  Stress: Stress Concern Present (03/19/2023)   [employer] of Occupational Health - Occupational Stress Questionnaire    Feeling of Stress : To some extent  Social Connections: Unknown (07/18/2023)   Received from [facility]   Social Network    Social Network: Not on file    Family History  Problem Relation Age of Onset   Hypertension Father    Pancreatic cancer Father    Diabetes Maternal Grandmother    Colon cancer Neg Hx     Stomach cancer Neg Hx    Esophageal cancer Neg Hx     Past Medical History:  Diagnosis Date   Arthritis    Asthma    COVID-19 07/23/2021   Depression    pt states she does not have depression. Cymbalta is used for pain control   Factor 5 Leiden mutation, heterozygous (HCC)    GERD (gastroesophageal reflux disease)    Hypertension    Hypothyroid    Pre-diabetes    pt states she is no longer pre-diabetic due to weight loss   Sleep apnea    on CPAP   Wears hearing aid in right ear     Past Surgical History:  Procedure Laterality Date   ABDOMINAL HYSTERECTOMY     ANKLE RECONSTRUCTION     CHOLECYSTECTOMY     COLONOSCOPY  12/09/2018   [facility].  3 mm sessile polyp, hepatic flexure. Mild - Moderate diverticulosis, sigmoid colon.   CYSTOCELE REPAIR     ESOPHAGOGASTRODUODENOSCOPY  12/09/2018   [facility]. Normal upper GI tract   RECTOCELE REPAIR     TENDON REPAIR     TOTAL ABDOMINAL HYSTERECTOMY     TOTAL HIP ARTHROPLASTY Right 08/25/2021   Procedure: RIGHT TOTAL HIP ARTHROPLASTY ANTERIOR APPROACH;  Surgeon: Tarry Kos, MD;  Location: [facility] OR;  Service: Orthopedics;  Laterality: Right;  3-C   TOTAL HIP ARTHROPLASTY Left 04/06/2022   Procedure: LEFT TOTAL HIP ARTHROPLASTY ANTERIOR APPROACH;  Surgeon: Tarry Kos, MD;  Location: [facility] OR;  Service: Orthopedics;  Laterality: Left;   TUBAL LIGATION       Current Outpatient Medications on File Prior to Visit  Medication Sig Dispense Refill   albuterol (VENTOLIN HFA) 108 (90 Base) MCG/ACT inhaler Inhale 2 puffs into the lungs every 6 (six) hours  as needed for wheezing or shortness of breath. 8 g 0   Cholecalciferol (VITAMIN D3) 50 MCG (2000 UT) TABS Take 2,000 Units by mouth daily.     cyanocobalamin (VITAMIN B12) 1000 MCG tablet Take 2,000 mcg by mouth daily.     DULoxetine (CYMBALTA) 30 MG capsule 2 BY MOUTH EVERY MORNING, 1 BY MOUTH EVERY AT NIGHT (Patient  taking differently: Take 30 mg by mouth See admin instructions. 2 BY MOUTH EVERY MORNING, 1 BY MOUTH EVERY AT NIGHT) 270 capsule 2   levothyroxine (SYNTHROID) 125 MCG tablet Take 1 tablet (125 mcg total) by mouth at bedtime. 90 tablet 1   lisinopril (ZESTRIL) 10 MG tablet TAKE 1 TABLET BY MOUTH EVERY DAY 90 tablet 3   Magnesium 200 MG TABS Take 200 mg by mouth daily.     montelukast (SINGULAIR) 10 MG tablet Take 1 tablet (10 mg total) by mouth at bedtime. 90 tablet 1   Moringa Oleifera (MORINGA PO) Take by mouth.     niacin 50 MG tablet Take 50 mg by mouth at bedtime.     NON FORMULARY 1 each by Other route at bedtime. cpap     omeprazole (PRILOSEC) 20 MG capsule Take 20 mg by mouth at bedtime.     ondansetron (ZOFRAN-ODT) 8 MG disintegrating tablet Take 1 tablet (8 mg total) by mouth every 8 (eight) hours as needed for nausea or vomiting. 12 tablet 0   pyridOXINE (VITAMIN B6) 100 MG tablet Take 100 mg by mouth daily.     spironolactone (ALDACTONE) 50 MG tablet TAKE 1 TABLET BY MOUTH EVERY DAY 90 tablet 1   tirzepatide (ZEPBOUND) 10 MG/0.5ML Pen Inject 10 mg into the skin once a week. 2 mL 1   TURMERIC CURCUMIN PO Take by mouth.     WEGOVY 2.4 MG/0.75ML SOAJ INJECT 2.4 MG INTO THE SKIN ONCE A WEEK. 3 mL 3   No current facility-administered medications on file prior to visit.    Allergies  Allergen Reactions   Ciprofloxacin Other (See Comments)    Detatched muscles and tendons    Apple Juice     ALL form of apple  Chemical burn to mouth and gums   Gluten Meal Other (See Comments)    Gut Distress   Benzoin    Claritin [Loratadine] Other (See Comments)    allergic psychosis   Septra [Sulfamethoxazole-Trimethoprim] Rash     DIAGNOSTIC DATA (LABS, IMAGING, TESTING) - I reviewed patient records, labs, notes, testing and imaging myself where available.  Lab Results  Component Value Date   WBC 7.8 03/31/2023   HGB 12.2 03/31/2023   HCT 37.9 03/31/2023   MCV 84.4 03/31/2023   PLT 352  03/31/2023      Component Value Date/Time   NA 140 03/31/2023 1408   NA 142 08/04/2022 1044   K 3.8 03/31/2023 1408   CL 104 03/31/2023 1408   CO2 30 03/31/2023 1408   GLUCOSE 86 03/31/2023 1408   BUN 22 03/31/2023 1408   BUN 28 (H) 08/04/2022 1044   CREATININE 0.83 03/31/2023 1408   CALCIUM 9.1 03/31/2023 1408   PROT 6.3 03/31/2023 1408   ALBUMIN 3.5 03/19/2023 1512   AST 16 03/31/2023 1408   AST 15 09/30/2022 1427   ALT 24 03/31/2023 1408   ALT 13 09/30/2022 1427   ALKPHOS 76 03/19/2023 1512   BILITOT 0.3 03/31/2023 1408   BILITOT 0.4 09/30/2022 1427   GFRNONAA >60 03/22/2023 0018   GFRNONAA >60 09/30/2022 1427  Lab Results  Component Value Date   CHOL 175 10/01/2021   HDL 52 10/01/2021   LDLCALC 104 (H) 10/01/2021   TRIG 95 10/01/2021   CHOLHDL 3.4 10/01/2021   Lab Results  Component Value Date   HGBA1C 5.2 08/04/2022   Lab Results  Component Value Date   VITAMINB12 300 08/06/2023   Lab Results  Component Value Date   TSH 0.680 08/06/2023    PHYSICAL EXAM:  Today's Vitals   09/15/23 1553  BP: 125/78  Pulse: 76  Weight: 186 lb (84.4 kg)  Height: 5\' 6"  (1.676 m)   Body mass index is 30.02 kg/m.   Wt Readings from Last 3 Encounters:  09/15/23 186 lb (84.4 kg)  08/06/23 190 lb (86.2 kg)  03/31/23 184 lb (83.5 kg)     Ht Readings from Last 3 Encounters:  09/15/23 5\' 6"  (1.676 m)  08/06/23 5\' 6"  (1.676 m)  03/31/23 5\' 6"  (1.676 m)      General: The patient is awake, alert and appears not in acute distress. The patient is well groomed. Head: is fluent,  without  dysarthria, dysphonia or aphasia.  Mood and affect are appropriate.   Cranial nerves: no loss of smell or taste reported  Pupils are equal and briskly reactive to light. Funduscopic exam deferred.   Extraocular movements in vertical and horizontal planes were intact and without nystagmus. No Diplopia. Visual fields by finger perimetry are intact. Hearing aid in situ- right ear- left  was intact to soft voice and finger rubbing. Facial sensation intact to fine touch. Facial motor strength is symmetric and tongue and uvula move midline.  Neck ROM : rotation, tilt and flexion extension were normal for age and shoulder shrug was symmetrical.    Motor exam:  Symmetric bulk, tone and ROM.  Left hip with restricted ROM.   Normal tone without cog- wheeling, symmetric grip strength .   Sensory:  Fine touch  and vibration were normal, exception of the left big toe and forefoot. S 1 injury.   Proprioception tested in the upper extremities was normal.   Coordination: Rapid alternating movements in the fingers/hands were of normal speed.  The Finger-to-nose maneuver was intact .  Gait and station: Patient could rise unassisted from a seated position, walked without assistive device.     ASSESSMENT AND PLAN 62 y.o. year old female  here with:    1) Subjective memory loss, but MOCA was normal, offered a repeat once a year. No family history  of dementia    2) very remote "seizure"history as an infant , consists of staring off- afterwards she needs a while to speak again.   Feels sleepy after a seizure, announced by olfactory aura.  3) massive weight loss, intended on medications, has been last tested for OSA while being heavy, now needs a retest to see if she actually needs her CPAP.    I plan to follow up through our NP within 6 months. HST follow up, no refills from here , may be in 6 months.   I would like to thank Christy Coombe, DO  for allowing me to meet with and to take care of this pleasant patient.  After spending a total time of  40  minutes face to face and additional time for physical and neurologic examination, review of laboratory studies,  personal review of imaging studies, reports and results of other testing and review of referral information / records as far as provided in visit,  Electronically signed by: Christy Novas, MD 09/15/2023 4:23  PM  [facility] Board certified by The [employer] of Sleep Medicine and Diplomate of the [employer] of Sleep Medicine. Board certified In Neurology through the ABPN, Fellow of the [employer] of Neurology.

## 2023-09-15 NOTE — Patient Instructions (Signed)

## 2023-09-18 ENCOUNTER — Other Ambulatory Visit: Payer: Self-pay | Admitting: Family Medicine

## 2023-09-18 DIAGNOSIS — J4530 Mild persistent asthma, uncomplicated: Secondary | ICD-10-CM

## 2023-09-20 ENCOUNTER — Ambulatory Visit (INDEPENDENT_AMBULATORY_CARE_PROVIDER_SITE_OTHER): Payer: Managed Care, Other (non HMO) | Admitting: Family Medicine

## 2023-09-20 ENCOUNTER — Encounter: Payer: Self-pay | Admitting: Family Medicine

## 2023-09-20 VITALS — BP 109/74 | HR 83 | Ht 66.0 in | Wt 187.0 lb

## 2023-09-20 DIAGNOSIS — M2550 Pain in unspecified joint: Secondary | ICD-10-CM | POA: Insufficient documentation

## 2023-09-20 MED ORDER — PREDNISONE 50 MG PO TABS: ORAL_TABLET | ORAL | 0 refills | Status: DC

## 2023-09-20 MED ORDER — METHYLPREDNISOLONE ACETATE 40 MG/ML IJ SUSP
40.0000 mg | Freq: Once | INTRAMUSCULAR | Status: AC
  Administered 2023-09-20: 40 mg via INTRAMUSCULAR

## 2023-09-20 MED ORDER — TRAMADOL HCL 50 MG PO TABS: 50.0000 mg | ORAL_TABLET | Freq: Three times a day (TID) | ORAL | 0 refills | Status: AC | PRN

## 2023-09-20 NOTE — Patient Instructions (Signed)
Start prednisone tomorrow.  Tramadol as needed for pain.  We'll be in touch with lab results.

## 2023-09-20 NOTE — Assessment & Plan Note (Signed)
Injection of depo-medrol given today. Start 5 day prednisone burst tomorrow. Checking labs listed below. Orders Placed This Encounter  Procedures   Sed Rate (ESR)   C-reactive protein   ANA,IFA RA Diag Pnl w/rflx Tit/Patn   TSH   Uric acid

## 2023-09-20 NOTE — Progress Notes (Signed)
Christy Todd - 62 y.o. female MRN [id number]  Date of birth: [birth year]  Subjective Chief Complaint  Patient presents with   Rheumatoid Arthritis    HPI Christy Todd is a 62 y.o. female here today with complaint of joint pain.  She has pain in the lower back as well as the R knee, b/l hands-especially her thumbs.  She does have swelling of her thumbs ad wrists. Symptoms started about 4 days ago. She does not recall any precipitating injury or overuse.  Hasn't tried anything so far for pain.    ROS:  A comprehensive ROS was completed and negative except as noted per HPI  Allergies  Allergen Reactions   Ciprofloxacin Other (See Comments)    Detatched muscles and tendons    Apple Juice     ALL form of apple  Chemical burn to mouth and gums   Gluten Meal Other (See Comments)    Gut Distress   Benzoin    Claritin [Loratadine] Other (See Comments)    allergic psychosis   Septra [Sulfamethoxazole-Trimethoprim] Rash    Past Medical History:  Diagnosis Date   Arthritis    Asthma    COVID-19 07/23/2021   Depression    pt states she does not have depression. Cymbalta is used for pain control   Factor 5 Leiden mutation, heterozygous (HCC)    GERD (gastroesophageal reflux disease)    Hypertension    Hypothyroid    Pre-diabetes    pt states she is no longer pre-diabetic due to weight loss   Sleep apnea    on CPAP   Wears hearing aid in right ear     Past Surgical History:  Procedure Laterality Date   ABDOMINAL HYSTERECTOMY     ANKLE RECONSTRUCTION     CHOLECYSTECTOMY     COLONOSCOPY  12/09/2018   [facility].  3 mm sessile polyp, hepatic flexure. Mild - Moderate diverticulosis, sigmoid colon.   CYSTOCELE REPAIR     ESOPHAGOGASTRODUODENOSCOPY  12/09/2018   Dr[facility]. Normal upper GI tract   RECTOCELE REPAIR     TENDON REPAIR     TOTAL ABDOMINAL HYSTERECTOMY     TOTAL HIP ARTHROPLASTY Right  08/25/2021   Procedure: RIGHT TOTAL HIP ARTHROPLASTY ANTERIOR APPROACH;  Surgeon: Tarry Kos, MD;  Location: [facility];  Service: Orthopedics;  Laterality: Right;  3-C   TOTAL HIP ARTHROPLASTY Left 04/06/2022   Procedure: LEFT TOTAL HIP ARTHROPLASTY ANTERIOR APPROACH;  Surgeon: Tarry Kos, MD;  Location: [facility] OR;  Service: Orthopedics;  Laterality: Left;   TUBAL LIGATION      Social History   Socioeconomic History   Marital status: Married    Spouse name: Annalie Rumford   Number of children: 2   Years of education: Not on file   Highest education level: 12th grade  Occupational History   Not on file  Tobacco Use   Smoking status: Never   Smokeless tobacco: Never  Vaping Use   Vaping status: Never Used  Substance and Sexual Activity   Alcohol use: Not Currently   Drug use: Never   Sexual activity: Yes    Partners: Male  Other Topics Concern   Not on file  Social History Narrative   Not on file   Social Determinants of Health   Financial Resource Strain: Low Risk  (03/19/2023)   Overall Financial Resource Strain (CARDIA)    Difficulty of Paying Living Expenses: Not hard at  all  Food Insecurity: No Food Insecurity (03/19/2023)   Hunger Vital Sign    Worried About Running Out of Food in the Last Year: Never true    Ran Out of Food in the Last Year: Never true  Transportation Needs: No Transportation Needs (03/19/2023)   PRAPARE - Administrator, Civil Service (Medical): No    Lack of Transportation (Non-Medical): No  Physical Activity: Sufficiently Active (03/19/2023)   Exercise Vital Sign    Days of Exercise per Week: 5 days    Minutes of Exercise per Session: 80 min  Stress: Stress Concern Present (03/19/2023)   [employer] of Occupational Health - Occupational Stress Questionnaire    Feeling of Stress : To some extent  Social Connections: Unknown (07/18/2023)   [facility]   Social Network    Social Network: Not on file    Family  History  Problem Relation Age of Onset   Hypertension Father    Pancreatic cancer Father    Diabetes Maternal Grandmother    Colon cancer Neg Hx    Stomach cancer Neg Hx    Esophageal cancer Neg Hx     Health Maintenance  Topic Date Due   Hepatitis C Screening  Never done   COVID-19 Vaccine (5 - 2023-24 season) 07/04/2023   MAMMOGRAM  01/22/2024 (Originally 07/10/2012)   DTaP/Tdap/Td (2 - Td or Tdap) 08/27/2030   Colonoscopy  07/05/2031   INFLUENZA VACCINE  Completed   HIV Screening  Completed   Zoster Vaccines- Shingrix  Completed   HPV VACCINES  Aged Out     ----------------------------------------------------------------------------------------------------------------------------------------------------------------------------------------------------------------- Physical Exam BP 109/74 (BP Location: Left Arm, Patient Position: Sitting, Cuff Size: Large)   Pulse 83   Ht 5\' 6"  (1.676 m)   Wt 187 lb (84.8 kg)   LMP 11/02/1993   SpO2 99%   BMI 30.18 kg/m   Physical Exam Constitutional:      Appearance: Normal appearance.  Skin:    Comments: Swelling at base of thumb and into wrist bilaterally.  Bakers cyst of L knee.   Neurological:     Mental Status: She is alert.     ------------------------------------------------------------------------------------------------------------------------------------------------------------------------------------------------------------------- Assessment and Plan  Arthralgia Injection of depo-medrol given today. Start 5 day prednisone burst tomorrow. Checking labs listed below. Orders Placed This Encounter  Procedures   Sed Rate (ESR)   C-reactive protein   ANA,IFA RA Diag Pnl w/rflx Tit/Patn   TSH   Uric acid     Meds ordered this encounter  Medications   traMADol (ULTRAM) 50 MG tablet    Sig: Take 1 tablet (50 mg total) by mouth every 8 (eight) hours as needed for up to 5 days.    Dispense:  10 tablet    Refill:  0    predniSONE (DELTASONE) 50 MG tablet    Sig: Take 50mg  daily x5 days.    Dispense:  5 tablet    Refill:  0   methylPREDNISolone acetate (DEPO-MEDROL) injection 40 mg    No follow-ups on file.    This visit occurred during the SARS-CoV-2 public health emergency.  Safety protocols were in place, including screening questions prior to the visit, additional usage of staff PPE, and extensive cleaning of exam room while observing appropriate contact time as indicated for disinfecting solutions.

## 2023-09-23 LAB — SEDIMENTATION RATE: Sed Rate: 27 mm/h (ref 0–40)

## 2023-09-23 LAB — ANA,IFA RA DIAG PNL W/RFLX TIT/PATN
ANA Titer 1: POSITIVE — AB
Cyclic Citrullin Peptide Ab: 6 U (ref 0–19)
Rheumatoid fact SerPl-aCnc: <10 [IU]/mL (ref <14.0)

## 2023-09-23 LAB — FANA STAINING PATTERNS: Speckled Pattern: 1:640 {titer} — ABNORMAL HIGH

## 2023-09-23 LAB — URIC ACID: Uric Acid: 4.8 mg/dL (ref 3.0–7.2)

## 2023-09-23 LAB — TSH: TSH: 0.464 u[IU]/mL (ref 0.450–4.500)

## 2023-09-23 LAB — C-REACTIVE PROTEIN: CRP: 6 mg/L (ref 0–10)

## 2023-09-24 ENCOUNTER — Other Ambulatory Visit: Payer: Self-pay | Admitting: Family Medicine

## 2023-09-24 DIAGNOSIS — M2550 Pain in unspecified joint: Secondary | ICD-10-CM

## 2023-09-24 DIAGNOSIS — R768 Other specified abnormal immunological findings in serum: Secondary | ICD-10-CM

## 2023-09-28 ENCOUNTER — Ambulatory Visit (INDEPENDENT_AMBULATORY_CARE_PROVIDER_SITE_OTHER): Payer: Managed Care, Other (non HMO) | Admitting: Neurology

## 2023-09-28 DIAGNOSIS — G4733 Obstructive sleep apnea (adult) (pediatric): Secondary | ICD-10-CM

## 2023-09-28 DIAGNOSIS — R634 Abnormal weight loss: Secondary | ICD-10-CM

## 2023-10-06 NOTE — Progress Notes (Signed)
[facility]  Female, 62 y.o., [birth year]    HOME SLEEP TEST REPORT ( by Watch PAT)   STUDY DATE:  10-06-2023- mail out test device    ORDERING CLINICIAN: Melvyn Novas, MD  REFERRING CLINICIAN: PCP   CLINICAL INFORMATION/HISTORY: Christy Todd is a 62 y.o. female patient who is here for new problem visit 09/15/2023 , this time for memory problems.  She has ben established here in the [place] sleep clinic, has been seen for seizures and has followed up with Butch Penny, NP, for  OSA on CPAP . History of asthma, HTN, hypothyroidism and hearing loss.  She has lost a lot of weight, a total of 140 pounds. She continues to use CPAP compliantly, and has a residual AHI of 1.1/h.  She has not been retested since her weight loss.    Chief concern according to patient :  "I am here today for memory testing". This patient wears hearing aids and feels these serve her well. She can participate in conversation. Its the generation or words and finding names that gives her trouble.  Delayed recall. No family history of dementia.   Retesting by HST to see if she continues to need CPAP, and if she has hypoxia.     Epworth sleepiness score: 15/ 24 points - high air leak on CPAP since her weight loss.   FSS endorsed at 30/ 63 points.    BMI: 30 kg/m   Neck Circumference: 15   FINDINGS:   Sleep Summary:   Total Recording Time (hours, min): 8 hours 7 minutes      Total Sleep Time (hours, min):    7 hours             Percent REM (%):   18.6%                                     Respiratory Indices following AASM criteria:   Calculated pAHI (per hour):    24.7/h                         REM pAHI:    32/h                                             NREM pAHI: 23/h                            Positional AHI: The patient slept 362 minutes in supine with an associated AHI of approximately 25/h, she slept 58 minutes in nonsupine position including left,  right lateral and prone with an AHI of 24/h.        No snoring was detected, the mean snoring volume was 40 dB which is the threshold of detection for this device.                                           Oxygen Saturation Statistics:   Oxygen Saturation (%) Mean:    94%           O2 Saturation Range (%):  Between the nadir at 85% of the maximal saturation of 98%, with 0.7 minutes of oxygen desaturation time, total.                                  Pulse Rate Statistics:   Pulse Mean (bpm): 82 bpm          Pulse Range:    Between 72 and 95 bpm.  Please be advised that this device cannot detect cardiac rhythm information ,only heart rate data are reflected here.             IMPRESSION:  This HST confirms the presence of purely obstructive sleep apnea at a moderate degree.  There is REM sleep accentuation but not REM sleep dependency, there was no evidence of heart significant hypoxia, or of bradycardia.  Her last sleep study revealed an overall AHI of  72.5/h-weight loss has helped to decrease her AHI by 66%!     RECOMMENDATION: I recommend to continue auto titration CPAP therapy based on above findings. No need to change settings as these have worked very well for this patient.   DME : Mask refitting is needed as her facial features have changed with weight loss and she now reports significant air leakage .     INTERPRETING PHYSICIAN:   Melvyn Novas, MD  [facility] Sleep Board certified by The [employer] of Sleep Medicine and Diplomate of the [employer] of Sleep Medicine. Board certified In Neurology through the ABPN, Fellow of the [employer] of [employer].

## 2023-10-10 ENCOUNTER — Telehealth: Payer: Managed Care, Other (non HMO) | Admitting: Family Medicine

## 2023-10-10 DIAGNOSIS — J019 Acute sinusitis, unspecified: Secondary | ICD-10-CM

## 2023-10-10 DIAGNOSIS — R051 Acute cough: Secondary | ICD-10-CM

## 2023-10-10 DIAGNOSIS — B9689 Other specified bacterial agents as the cause of diseases classified elsewhere: Secondary | ICD-10-CM | POA: Diagnosis not present

## 2023-10-10 MED ORDER — AMOXICILLIN-POT CLAVULANATE 875-125 MG PO TABS: 1.0000 | ORAL_TABLET | Freq: Two times a day (BID) | ORAL | 0 refills | Status: DC

## 2023-10-10 MED ORDER — BENZONATATE 200 MG PO CAPS: 200.0000 mg | ORAL_CAPSULE | Freq: Two times a day (BID) | ORAL | 0 refills | Status: DC | PRN

## 2023-10-10 NOTE — Progress Notes (Signed)
Virtual Visit Consent   Christy Todd, you are scheduled for a virtual visit with a [facility] provider today. Just as with appointments in the office, your consent must be obtained to participate. Your consent will be active for this visit and any virtual visit you may have with one of our providers in the next 365 days. If you have a MyChart account, a copy of this consent can be sent to you electronically.  As this is a virtual visit, video technology does not allow for your provider to perform a traditional examination. This may limit your provider's ability to fully assess your condition. If your provider identifies any concerns that need to be evaluated in person or the need to arrange testing (such as labs, EKG, etc.), we will make arrangements to do so. Although advances in technology are sophisticated, we cannot ensure that it will always work on either your end or our end. If the connection with a video visit is poor, the visit may have to be switched to a telephone visit. With either a video or telephone visit, we are not always able to ensure that we have a secure connection.  By engaging in this virtual visit, you consent to the provision of healthcare and authorize for your insurance to be billed (if applicable) for the services provided during this visit. Depending on your insurance coverage, you may receive a charge related to this service.  I need to obtain your verbal consent now. Are you willing to proceed with your visit today? Christy Todd has provided verbal consent on 10/10/2023 for a virtual visit (video or telephone). Georgana Curio, FNP  Date: 10/10/2023 12:31 PM  Virtual Visit via Video Note   I, Georgana Curio, connected with  Christy Todd  ([id number], 03-30-1961) on 10/10/23 at 12:30 PM EST by a video-enabled telemedicine application and verified that I am speaking with the correct person using two identifiers.  Location: Patient: Virtual Visit Location Patient:  Home Provider: Virtual Visit Location Provider: Home Office   I discussed the limitations of evaluation and management by telemedicine and the availability of in person appointments. The patient expressed understanding and agreed to proceed.    History of Present Illness: Christy Todd is a 62 y.o. who identifies as a female who was assigned female at birth, and is being seen today for cough, sinus pressure and pain, no fever, has a headache and dizziness, sx for 7 days worsening. 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   Clavicle pain 02/13/2022   Primary osteoarthritis of left hip 11/21/2021   Orthostatic hypotension 09/22/2021   Dizziness 09/22/2021   Drug-induced weight loss 09/22/2021   Status post total replacement of right hip 08/25/2021   Morbid obesity (HCC) 11/14/2020   Sleep behavior disorder, 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   History of seizure 07/02/2020   Obesity with alveolar hypoventilation and body mass index (BMI) of 40 or greater (HCC) 07/02/2020   Severe obstructive sleep apnea-hypopnea syndrome 07/02/2020   OSA on CPAP 07/02/2020   Essential hypertension 05/07/2020   Chronic ankle pain 05/07/2020   Degenerative joint disease (DJD) of hip 05/07/2020   Seizure disorder (HCC) 05/07/2020  Hypothyroidism 05/07/2020   Mild persistent asthma 05/07/2020    Allergies:  Allergies  Allergen Reactions   Ciprofloxacin Other (See Comments)    Detatched muscles and tendons    Apple Juice     ALL form of apple  Chemical burn to mouth and gums   Gluten Meal Other (See Comments)    Gut Distress   Benzoin    Claritin [Loratadine] Other (See Comments)    allergic psychosis    Septra [Sulfamethoxazole-Trimethoprim] Rash   Medications:  Current Outpatient Medications:    amoxicillin-clavulanate (AUGMENTIN) 875-125 MG tablet, Take 1 tablet by mouth 2 (two) times daily., Disp: 20 tablet, Rfl: 0   benzonatate (TESSALON) 200 MG capsule, Take 1 capsule (200 mg total) by mouth 2 (two) times daily as needed for cough., Disp: 20 capsule, Rfl: 0   albuterol (VENTOLIN HFA) 108 (90 Base) MCG/ACT inhaler, Inhale 2 puffs into the lungs every 6 (six) hours as needed for wheezing or shortness of breath., Disp: 8 g, Rfl: 0   Cholecalciferol (VITAMIN D3) 50 MCG (2000 UT) TABS, Take 2,000 Units by mouth daily., Disp: , Rfl:    cyanocobalamin (VITAMIN B12) 1000 MCG tablet, Take 2,000 mcg by mouth daily., Disp: , Rfl:    DULoxetine (CYMBALTA) 30 MG capsule, 2 BY MOUTH EVERY MORNING, 1 BY MOUTH EVERY AT NIGHT (Patient taking differently: Take 30 mg by mouth See admin instructions. 2 BY MOUTH EVERY MORNING, 1 BY MOUTH EVERY AT NIGHT), Disp: 270 capsule, Rfl: 2   levothyroxine (SYNTHROID) 125 MCG tablet, TAKE 1 TABLET (125 MCG TOTAL) BY MOUTH AT BEDTIME., Disp: 90 tablet, Rfl: 1   lisinopril (ZESTRIL) 10 MG tablet, TAKE 1 TABLET BY MOUTH EVERY DAY, Disp: 90 tablet, Rfl: 3   Magnesium 200 MG TABS, Take 200 mg by mouth daily., Disp: , Rfl:    montelukast (SINGULAIR) 10 MG tablet, TAKE 1 TABLET BY MOUTH EVERYDAY AT BEDTIME, Disp: 90 tablet, Rfl: 1   Moringa Oleifera (MORINGA PO), Take by mouth., Disp: , Rfl:    niacin 50 MG tablet, Take 50 mg by mouth at bedtime., Disp: , Rfl:    NON FORMULARY, 1 each by Other route at bedtime. cpap, Disp: , Rfl:    omeprazole (PRILOSEC) 20 MG capsule, Take 20 mg by mouth at bedtime., Disp: , Rfl:    ondansetron (ZOFRAN-ODT) 8 MG disintegrating tablet, Take 1 tablet (8 mg total) by mouth every 8 (eight) hours as needed for nausea or vomiting., Disp: 12 tablet, Rfl: 0   predniSONE (DELTASONE) 50 MG tablet, Take 50mg  daily x5 days., Disp: 5 tablet, Rfl: 0    pyridOXINE (VITAMIN B6) 100 MG tablet, Take 100 mg by mouth daily., Disp: , Rfl:    spironolactone (ALDACTONE) 50 MG tablet, TAKE 1 TABLET BY MOUTH EVERY DAY, Disp: 90 tablet, Rfl: 1   tirzepatide (ZEPBOUND) 10 MG/0.5ML Pen, Inject 10 mg into the skin once a week., Disp: 2 mL, Rfl: 1   TURMERIC CURCUMIN PO, Take by mouth., Disp: , Rfl:   Observations/Objective: Patient is well-developed, well-nourished in no acute distress.  Resting comfortably  at home.  Head is normocephalic, atraumatic.  No labored breathing.  Speech is clear and coherent with logical content.  Patient is alert and oriented at baseline.    Assessment and Plan: 1. Acute bacterial sinusitis  2. Acute cough  Increase fluids, humidifier at night, tylenol or ibuprofen as directed, UC if sx persist or worsen.   Follow Up Instructions: I discussed the assessment and  treatment plan with the patient. The patient was provided an opportunity to ask questions and all were answered. The patient agreed with the plan and demonstrated an understanding of the instructions.  A copy of instructions were sent to the patient via MyChart unless otherwise noted below.     The patient was advised to call back or seek an in-person evaluation if the symptoms worsen or if the condition fails to improve as anticipated.    Georgana Curio, FNP

## 2023-10-10 NOTE — Patient Instructions (Signed)

## 2023-10-15 NOTE — Patient Instructions (Signed)
[facility]  Female, 62 y.o., 10-15-1961    HOME SLEEP TEST REPORT ( by Watch PAT)   STUDY DATE:  10-06-2023- mail out test device    ORDERING CLINICIAN: Melvyn Novas, MD  REFERRING CLINICIAN: PCP   CLINICAL INFORMATION/HISTORY: Christy Todd is a 62 y.o. female patient who is here for new problem visit 09/15/2023 , this time for memory problems.  She has ben established here in the [place] sleep clinic, has been seen for seizures and has followed up with Butch Penny, NP, for  OSA on CPAP . History of asthma, HTN, hypothyroidism and hearing loss.  She has lost a lot of weight, a total of 140 pounds. She continues to use CPAP compliantly, and has a residual AHI of 1.1/h.  She has not been retested since her weight loss.    Chief concern according to patient :  "I am here today for memory testing". This patient wears hearing aids and feels these serve her well. She can participate in conversation. Its the generation or words and finding names that gives her trouble.  Delayed recall. No family history of dementia.   Retesting by HST to see if she continues to need CPAP, and if she has hypoxia.     Epworth sleepiness score: 15/ 24 points - high air leak on CPAP since her weight loss.   FSS endorsed at 30/ 63 points.    BMI: 30 kg/m   Neck Circumference: 15   FINDINGS:   Sleep Summary:   Total Recording Time (hours, min): 8 hours 7 minutes      Total Sleep Time (hours, min):    7 hours             Percent REM (%):   18.6%                                     Respiratory Indices following AASM criteria:   Calculated pAHI (per hour):    24.7/h                         REM pAHI:    32/h                                             NREM pAHI: 23/h                            Positional AHI: The patient slept 362 minutes in supine with an associated AHI of approximately 25/h, she slept 58 minutes in nonsupine position including left, right  lateral and prone with an AHI of 24/h.        No snoring was detected, the mean snoring volume was 40 dB which is the threshold of detection for this device.                                           Oxygen Saturation Statistics:   Oxygen Saturation (%) Mean:    94%           O2 Saturation Range (%):  Between the nadir at 85% of the maximal saturation of 98%, with 0.7 minutes of oxygen desaturation time, total.                                  Pulse Rate Statistics:   Pulse Mean (bpm): 82 bpm          Pulse Range:    Between 72 and 95 bpm.  Please be advised that this device cannot detect cardiac rhythm information ,only heart rate data are reflected here.             IMPRESSION:  This HST confirms the presence of purely obstructive sleep apnea at a moderate degree.  There is REM sleep accentuation but not REM sleep dependency, there was no evidence of heart significant hypoxia, or of bradycardia.  Her last sleep study revealed an overall AHI of  72.5/h-weight loss has helped to decrease her AHI by 66%!     RECOMMENDATION: I recommend to continue auto titration CPAP therapy based on above findings. No need to change settings as these have worked very well for this patient.   DME : Mask refitting is needed as her facial features have changed with weight loss and she now reports significant air leakage .     INTERPRETING PHYSICIAN:   Melvyn Novas, MD  [facility] and [facility] Sleep Board certified by The [employer] of Sleep Medicine and Diplomate of the [employer] Sleep Medicine. Board certified In Neurology through the ABPN, Fellow of the [employer].

## 2023-10-15 NOTE — Procedures (Signed)
[facility]  Female, 62 y.o., [birth year]    HOME SLEEP TEST REPORT ( by Watch PAT)   STUDY DATE:  10-06-2023- mail out test device    ORDERING CLINICIAN: Melvyn Novas, MD  REFERRING CLINICIAN: PCP   CLINICAL INFORMATION/HISTORY: Christy Todd is a 62 y.o. female patient who is here for new problem visit 09/15/2023 , this time for memory problems.  She has ben established here in the [place] sleep clinic, has been seen for seizures and has followed up with Butch Penny, NP, for  OSA on CPAP . History of asthma, HTN, hypothyroidism and hearing loss.  She has lost a lot of weight, a total of 140 pounds. She continues to use CPAP compliantly, and has a residual AHI of 1.1/h.  She has not been retested since her weight loss.    Chief concern according to patient :  "I am here today for memory testing". This patient wears hearing aids and feels these serve her well. She can participate in conversation. Its the generation or words and finding names that gives her trouble.  Delayed recall. No family history of dementia.   Retesting by HST to see if she continues to need CPAP, and if she has hypoxia.     Epworth sleepiness score: 15/ 24 points - high air leak on CPAP since her weight loss.   FSS endorsed at 30/ 63 points.    BMI: 30 kg/m   Neck Circumference: 15   FINDINGS:   Sleep Summary:   Total Recording Time (hours, min): 8 hours 7 minutes      Total Sleep Time (hours, min):    7 hours             Percent REM (%):   18.6%                                     Respiratory Indices following AASM criteria:   Calculated pAHI (per hour):    24.7/h                         REM pAHI:    32/h                                             NREM pAHI: 23/h                            Positional AHI: The patient slept 362 minutes in supine with an associated AHI of approximately 25/h, she slept 58 minutes in nonsupine position including left,  right lateral and prone with an AHI of 24/h.        No snoring was detected, the mean snoring volume was 40 dB which is the threshold of detection for this device.                                           Oxygen Saturation Statistics:   Oxygen Saturation (%) Mean:    94%           O2 Saturation Range (%):  Between the nadir at 85% of the maximal saturation of 98%, with 0.7 minutes of oxygen desaturation time, total.                                  Pulse Rate Statistics:   Pulse Mean (bpm): 82 bpm          Pulse Range:    Between 72 and 95 bpm.  Please be advised that this device cannot detect cardiac rhythm information ,only heart rate data are reflected here.             IMPRESSION:  This HST confirms the presence of purely obstructive sleep apnea at a moderate degree.  There is REM sleep accentuation but not REM sleep dependency, there was no evidence of heart significant hypoxia, or of bradycardia.  Her last sleep study revealed an overall AHI of  72.5/h-weight loss has helped to decrease her AHI by 66%!     RECOMMENDATION: I recommend to continue auto titration CPAP therapy based on above findings. No need to change settings as these have worked very well for this patient.   DME : Mask refitting is needed as her facial features have changed with weight loss and she now reports significant air leakage .     INTERPRETING PHYSICIAN:   Melvyn Novas, MD  [facility] Sleep Board certified by The [employer] of Sleep Medicine and Diplomate of the [employer] Sleep Medicine. Board certified In Neurology through the ABPN, Fellow of the [employer].

## 2023-10-19 ENCOUNTER — Other Ambulatory Visit: Payer: Self-pay | Admitting: Family Medicine

## 2023-10-20 ENCOUNTER — Telehealth: Payer: Self-pay | Admitting: *Deleted

## 2023-10-20 DIAGNOSIS — G4733 Obstructive sleep apnea (adult) (pediatric): Secondary | ICD-10-CM

## 2023-10-20 NOTE — Telephone Encounter (Signed)
-----   Message from Groveland Dohmeier sent at 10/15/2023  2:00 PM EST ----- CPAP use to continue, moderate OSA is still seen, but baseline is now much lower than prior to weight loss. How old is her machine ? If 61 years old, need to be replaced with a machine of same settings, refitting for a mask is recommended.

## 2023-10-20 NOTE — Telephone Encounter (Signed)
Spoke with pt gave sleep study results Pt not able to get new cpap due to set up date 08/30/2020 Pt aware we will be sending a order to her DME for new cpap supplies Pt expressed understanding and thanked me for calling .Placed order for new supplies Pt will keep [facility] has DME

## 2023-11-11 NOTE — Progress Notes (Signed)
Office Visit Note  Patient: Christy Todd             Date of Birth: May 15, 1961           MRN: [id number]             PCP: Christy Coombe, DO Referring: Christy Coombe, DO Visit Date: 11/25/2023 Occupation: @GUAROCC @  Subjective:  Pain in multiple joints  History of Present Illness: Christy Todd is a 63 y.o. female seen in consultation per request of her PCP for the evaluation of positive ANA and joint pain.  According the patient her symptoms started with left hip pain in 1999 when she was living in [place].  Gradually the pain moved to her lower back.  She states she tried several NSAIDs under the care of her rheumatologist and then started taking meloxicam.  She states aspirin worked as well.  She also developed bilateral Achilles tendon rupture while she was on Cipro about 15 years ago and developed a stress fracture in her feet requiring surgery.  She does not recall taking frequent prednisone soon.  She recalls taking prednisone occasionally for sinus infections.  She states in 2020 she developed swelling in her right sternoclavicular joint for which she was seen by her PCP Christy Todd.  She had labs which were abnormal and she was referred to an oncologist.  She was diagnosed with factor V Leyden and was advised to take niacin and aspirin.  She recalls having CT scan of her right sternoclavicular joint.  She states since October 2024 she has been experiencing pain in her neck.  She has difficulty turning her head to the right side.  She has history of lower back pain for many years.  She complains of pain and discomfort in her bilateral hands and bilateral lower extremities.  She states in November she was given a steroid injection and a prednisone taper by her PCP for 5 days which improved her symptoms but then the pain gradually recurred.  She complains of pain and discomfort in her neck, lower back, chronic pain in her shoulder since 2007, pain in her hands with decreased grip  strength.  She had right total hip replacement in 2022 and left total hip replacement in 2023 by Dr. Deno Todd.  She had good results from the hip replacement surgery.  She has intermittent left trochanteric bursitis.  She also has some right knee joint pain.  She continues to have pain and discomfort in her feet.  She states she has been taking Cymbalta for the feet pain.She gives history of dry mouth, dry eyes, photosensitivity.  There is no history of oral ulcers, Raynaud's, lymphadenopathy.  There is history of lupus in her maternal aunt and knees.  She is gravida 3, para 2, miscarriage 1.  No history of preeclampsia.  There is no history of DVTs.   Activities of Daily Living:  Patient reports morning stiffness for 15 minutes.   Patient Reports nocturnal pain.  Difficulty dressing/grooming: Denies Difficulty climbing stairs: Denies Difficulty getting out of chair: Denies Difficulty using hands for taps, buttons, cutlery, and/or writing: Reports  Review of Systems  Constitutional:  Positive for fatigue.  HENT:  Positive for mouth dryness. Negative for mouth sores.   Eyes:  Positive for dryness.  Respiratory:  Positive for shortness of breath. Negative for difficulty breathing.        On exertion   Cardiovascular:  Negative for chest pain and palpitations.  Gastrointestinal:  Positive for constipation. Negative  for blood in stool and diarrhea.  Endocrine: Negative for increased urination.  Genitourinary:  Negative for involuntary urination.  Musculoskeletal:  Positive for joint pain, joint pain, joint swelling, myalgias, muscle weakness, morning stiffness, muscle tenderness and myalgias. Negative for gait problem.  Skin:  Negative for color change, rash, hair loss, redness and sensitivity to sunlight.  Allergic/Immunologic: Positive for susceptible to infections.  Neurological:  Positive for dizziness and numbness. Negative for headaches.  Hematological:  Negative for swollen glands.   Psychiatric/Behavioral:  Negative for depressed mood and sleep disturbance. The patient is nervous/anxious.     PMFS History:  Patient Active Problem List   Diagnosis Date Noted   Arthralgia 09/20/2023   Word finding difficulty 08/06/2023   Hypomagnesemia 03/31/2023   Diverticulitis 03/19/2023   Constipation 03/19/2023   Abnormal weight gain 01/23/2023   Dyspnea 09/04/2022   Chest pain 07/06/2022   Status post total replacement of left hip 04/06/2022   Clavicle pain 02/13/2022   Primary osteoarthritis of left hip 11/21/2021   Orthostatic hypotension 09/22/2021   Dizziness 09/22/2021   Drug-induced weight loss 09/22/2021   Status post total replacement of right hip 08/25/2021   Morbid obesity (HCC) 11/14/2020   Sleep behavior disorder, 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 Cymbalta is used for pain control   Factor 5 Leiden mutation, heterozygous (HCC)    GERD (gastroesophageal reflux disease)    Hypertension    Hypothyroid    Pre-diabetes    pt states she is no longer pre-diabetic due to weight loss   Sleep apnea    on CPAP   Wears hearing aid in right ear     Family History  Problem Relation Age of Onset   Hashimoto's thyroiditis Mother    Stroke Mother    Heart attack Mother    Hypertension Father    Pancreatic cancer Father    Retinal detachment Brother    Lupus Maternal Aunt    Cancer Maternal Aunt     Diabetes Maternal Grandmother    Lupus Cousin    Colon cancer Neg Hx    Stomach cancer Neg Hx    Esophageal cancer Neg Hx    Past Surgical History:  Procedure Laterality Date   ABDOMINAL HYSTERECTOMY     ANKLE RECONSTRUCTION Left    CHOLECYSTECTOMY     COLONOSCOPY  12/09/2018   Dr. Gerlene [facility].  3 mm sessile polyp, hepatic flexure. Mild - Moderate diverticulosis, sigmoid colon.   CYSTOCELE REPAIR     ESOPHAGOGASTRODUODENOSCOPY  12/09/2018   Dr. Gerlene [facility]. Normal upper GI tract   RECTOCELE REPAIR     SALPINGECTOMY     SYNDACTLYLY REPAIR Right    SYNOVIAL CYST EXCISION Left    index finger   TENDON REPAIR     TOTAL ABDOMINAL HYSTERECTOMY     TOTAL HIP ARTHROPLASTY Right 08/25/2021   Procedure:  RIGHT TOTAL HIP ARTHROPLASTY ANTERIOR APPROACH;  Surgeon: Tarry Kos, MD;  Location: [facility] OR;  Service: Orthopedics;  Laterality: Right;  3-C   TOTAL HIP ARTHROPLASTY Left 04/06/2022   Procedure: LEFT TOTAL HIP ARTHROPLASTY ANTERIOR APPROACH;  Surgeon: Tarry Kos, MD;  Location: [facility] OR;  Service: Orthopedics;  Laterality: Left;   Social History   Social History Narrative   Not on file   Immunization History  Administered Date(s) Administered   Influenza, Seasonal, Injecte, Preservative Fre 08/06/2023   Influenza,inj,Quad PF,6+ Mos 10/03/2021, 09/04/2022   Influenza-Unspecified 08/30/2019, 08/20/2020   PFIZER(Purple Top)SARS-COV-2 Vaccination 12/22/2019, 01/15/2020, 08/20/2020, 10/20/2021   Tdap 08/27/2020   Zoster Recombinant(Shingrix) 05/23/2021, 11/07/2021     Objective: Vital Signs: BP 113/78 (BP Location: Right Arm, Patient Position: Sitting, Cuff Size: Normal)   Pulse 73   Resp 16   Ht 5' 5.5" (1.664 m)   Wt 190 lb 3.2 oz (86.3 kg)   LMP 11/02/1993   BMI 31.17 kg/m    Physical Exam Vitals and nursing note reviewed.  Constitutional:      Appearance: She is well-developed.  HENT:     Head:  Normocephalic and atraumatic.  Eyes:     Conjunctiva/sclera: Conjunctivae normal.  Cardiovascular:     Rate and Rhythm: Normal rate and regular rhythm.     Heart sounds: Normal heart sounds.  Pulmonary:     Effort: Pulmonary effort is normal.     Breath sounds: Normal breath sounds.  Abdominal:     General: Bowel sounds are normal.     Palpations: Abdomen is soft.  Musculoskeletal:     Cervical back: Normal range of motion.  Lymphadenopathy:     Cervical: No cervical adenopathy.  Skin:    General: Skin is warm and dry.     Capillary Refill: Capillary refill takes less than 2 seconds.  Neurological:     Mental Status: She is alert and oriented to person, place, and time.  Psychiatric:        Behavior: Behavior normal.      Musculoskeletal Exam: Patient had good lateral rotation of the cervical spine more so on the right than the left.  She had no tenderness over the thoracic spine.  She had painful range of motion of the lumbar spine.  She had thickening of right sternoclavicular joint.  She had good range of motion of bilateral shoulders with some limitation on internal rotation.  Elbow joints and wrist joints were in good range of motion.  She had a ganglion cyst on the volar aspect of her right wrist.  She had bilateral CMC, PIP and DIP thickening with no synovitis.  Hip joints were replaced and were in good range of motion.  Knee joints were in good range of motion without any warmth swelling or effusion.  There was no synovitis of her ankles.  There was no Achilles tendinitis or plantar fasciitis.  She had bilateral first MTP, PIP and DIP thickening with no synovitis.  CDAI Exam: CDAI Score: -- Patient Global: --; Provider Global: -- Swollen: --; Tender: -- Joint Exam 11/25/2023   No joint exam has been documented for this visit   There is currently no information documented on the homunculus. Go to the Rheumatology activity and complete the homunculus joint  exam.  Investigation: No additional findings.  Imaging: XR Cervical Spine 2 or 3 views Result Date: 11/25/2023 Loss of lordosis was noted.  Multilevel spondylosis with narrowing between C6-C7 and C7-T1 was noted.  Anterior spurring  was noted.  Facet joint arthropathy was noted. Impression: These findings are suggestive of multilevel spondylosis and facet joint arthropathy.  XR Lumbar Spine 2-3 Views Result Date: 11/25/2023 No significant disc space narrowing was noted.  Facet joint arthropathy was noted.  No syndesmophytes were noted.  No SI joint sclerosis or narrowing was noted. Impression: These findings are suggestive of lumbar facet joint arthropathy.  XR Foot 2 Views Right Result Date: 11/25/2023 First MTP, PIP and DIP narrowing was noted.  Partial resection of fifth proximal phalanx was noted.  No intertarsal, tibiotalar or subtalar joint space narrowing was noted.  Inferior calcaneal spur was noted. Impression: These findings were suggestive of osteoarthritis of the foot and postsurgical changes.  XR Hand 2 View Left Result Date: 11/25/2023 Severe CMC narrowing and subluxation was noted.  PIP and DIP narrowing was noted.  No MCP, intercarpal or radiocarpal joint space narrowing was noted.  No erosive changes were noted. Impression: These findings are suggestive of osteoarthritis of the hand.  XR Hand 2 View Right Result Date: 11/25/2023 CMC, PIP and DIP narrowing was noted.  No MCP, intercarpal or radiocarpal joint space narrowing was noted.  No erosive changes were noted. Impression: These findings suggestive of osteoarthritis of the hand.   Recent Labs: Lab Results  Component Value Date   WBC 7.8 03/31/2023   HGB 12.2 03/31/2023   PLT 352 03/31/2023   NA 140 03/31/2023   K 3.8 03/31/2023   CL 104 03/31/2023   CO2 30 03/31/2023   GLUCOSE 86 03/31/2023   BUN 22 03/31/2023   CREATININE 0.83 03/31/2023   BILITOT 0.3 03/31/2023   ALKPHOS 76 03/19/2023   AST 16 03/31/2023    ALT 24 03/31/2023   PROT 6.3 03/31/2023   ALBUMIN 3.5 03/19/2023   CALCIUM 9.1 03/31/2023   September 10, 2023 ANA 1: 640 NS, RF negative, anti-CCP negative, sed rate 27, CRP 6, TSH normal, uric acid 4.8   Speciality Comments: No specialty comments available.  Procedures:  No procedures performed Allergies: Ciprofloxacin, Apple juice, Gluten meal, Benzoin, Claritin [loratadine], and Septra [sulfamethoxazole-trimethoprim]   Assessment / Plan:     Visit Diagnoses: Positive ANA (antinuclear antibody) -patient was referred to me for the evaluation of positive ANA and joint pain.  She denies any history of oral ulcers, nasal ulcers, Raynaud's, photosensitivity, lymphadenopathy.  She gives history of sicca symptoms send joint pain.  Patient states she had increased joint swelling.  According to the patient in November 2024 she was given intramuscular steroid injection and oral prednisone.  No synovitis was noted on the examination today.  Plan: ANA, Anti-scleroderma antibody, RNP Antibody, Anti-Smith antibody, Sjogrens syndrome-A extractable nuclear antibody, Sjogrens syndrome-B extractable nuclear antibody, Anti-DNA antibody, double-stranded, C3 and C4  Polyarthralgia-she complains of pain and discomfort in multiple joints for many years.  No synovitis was noted on the exam  Pain in both hands -she complains of discomfort in the bilateral hands with swelling.  Bilateral CMC PIP and DIP thickening with no synovitis was noted.  Plan: XR Hand 2 View Right, XR Hand 2 View Left.  X-rays were suggestive of osteoarthritis.  A handout on hand exercises was given.  Clavicle pain-she gives history of right sternoclavicular fullness since 2020.  I reviewed MRI of her right clavicle from April 2023 which showed elevation of the right sternoclavicular joint when compared to the left with no erosive changes or destructive changes per radiology report.  Soft tissue was also unremarkable.  Status post total  replacement of both hips - RTHR 08/25/21, LTHR 04/06/22 by Dr. Roda Shutters.  She could have motion of bilateral hip joints.  Chronic pain of left ankle-she complains of discomfort in her left ankle joint and left foot.  She was evaluated by Dr. Lilian Kapur in the past.  I reviewed his note from April 2024 and reviewed her x-ray of her left foot which showed partial resection of fifth metatarsal.  He noted hallux varus deformity.  Pain in both feet -patient gives history of Achilles tendon rupture in the past after taking Cipro about 15 years ago while she was living in [place].  She states she also had a stress fracture of her toes.  She had partial resection of her left fifth metatarsal.  No synovitis was noted on the examination.  Plan: XR Foot 2 Views Right.  Showed osteoarthritic changes.  Neck pain -she gives history of pain and discomfort in her cervical spine for many years.  She had limited lateral rotation especially to the right side today.  Plan: XR Cervical Spine 2 or 3 views.  Multilevel spondylosis and facet joint arthropathy was noted.  A handout on neck exercises was given.  Chronic midline low back pain without sciatica -she gives history of chronic lower back pain.  She had good mobility with discomfort.  Plan: XR Lumbar Spine 2-3 Views.  X-rays showed lumbar facet joint arthropathy.  Other fatigue -she gives history of fatigue.  Plan: CBC with Differential/Platelet, COMPLETE METABOLIC PANEL WITH GFR, CK, Glucose 6 phosphate dehydrogenase  Other medical problems are listed as follows:  Essential hypertension  Diverticulitis  Orthostatic hypotension  Mild persistent asthma without complication  Hypothyroidism due to Hashimoto thyroiditis  Seizure disorder (HCC)  Sleep behavior disorder, REM  OSA on CPAP  Family history of systemic lupus erythematosus-maternal aunt and niece  Orders: Orders Placed This Encounter  Procedures   XR Cervical Spine 2 or 3 views   XR Lumbar Spine  2-3 Views   XR Hand 2 View Right   XR Hand 2 View Left   XR Foot 2 Views Right   CBC with Differential/Platelet   COMPLETE METABOLIC PANEL WITH GFR   CK   ANA   Anti-scleroderma antibody   RNP Antibody   Anti-Smith antibody   Sjogrens syndrome-A extractable nuclear antibody   Sjogrens syndrome-B extractable nuclear antibody   Anti-DNA antibody, double-stranded   C3 and C4   Glucose 6 phosphate dehydrogenase   No orders of the defined types were placed in this encounter.   Follow-Up Instructions: Return for Polyarthralgia.   Pollyann Savoy, MD  Note - This record has been created using Animal nutritionist.  Chart creation errors have been sought, but may not always  have been located. Such creation errors do not reflect on  the standard of medical care.

## 2023-11-25 ENCOUNTER — Ambulatory Visit: Payer: Managed Care, Other (non HMO) | Attending: Rheumatology | Admitting: Rheumatology

## 2023-11-25 ENCOUNTER — Ambulatory Visit (INDEPENDENT_AMBULATORY_CARE_PROVIDER_SITE_OTHER): Payer: Managed Care, Other (non HMO)

## 2023-11-25 ENCOUNTER — Encounter: Payer: Self-pay | Admitting: Rheumatology

## 2023-11-25 ENCOUNTER — Ambulatory Visit: Payer: Managed Care, Other (non HMO)

## 2023-11-25 VITALS — BP 113/78 | HR 73 | Resp 16 | Ht 65.5 in | Wt 190.2 lb

## 2023-11-25 DIAGNOSIS — M545 Low back pain, unspecified: Secondary | ICD-10-CM | POA: Diagnosis not present

## 2023-11-25 DIAGNOSIS — E063 Autoimmune thyroiditis: Secondary | ICD-10-CM

## 2023-11-25 DIAGNOSIS — M542 Cervicalgia: Secondary | ICD-10-CM

## 2023-11-25 DIAGNOSIS — G4733 Obstructive sleep apnea (adult) (pediatric): Secondary | ICD-10-CM

## 2023-11-25 DIAGNOSIS — M2550 Pain in unspecified joint: Secondary | ICD-10-CM | POA: Diagnosis not present

## 2023-11-25 DIAGNOSIS — G4752 REM sleep behavior disorder: Secondary | ICD-10-CM

## 2023-11-25 DIAGNOSIS — M79642 Pain in left hand: Secondary | ICD-10-CM

## 2023-11-25 DIAGNOSIS — I10 Essential (primary) hypertension: Secondary | ICD-10-CM

## 2023-11-25 DIAGNOSIS — G40909 Epilepsy, unspecified, not intractable, without status epilepticus: Secondary | ICD-10-CM

## 2023-11-25 DIAGNOSIS — G8929 Other chronic pain: Secondary | ICD-10-CM

## 2023-11-25 DIAGNOSIS — M898X1 Other specified disorders of bone, shoulder: Secondary | ICD-10-CM | POA: Diagnosis not present

## 2023-11-25 DIAGNOSIS — M79672 Pain in left foot: Secondary | ICD-10-CM

## 2023-11-25 DIAGNOSIS — Z8269 Family history of other diseases of the musculoskeletal system and connective tissue: Secondary | ICD-10-CM

## 2023-11-25 DIAGNOSIS — M79641 Pain in right hand: Secondary | ICD-10-CM

## 2023-11-25 DIAGNOSIS — Z96643 Presence of artificial hip joint, bilateral: Secondary | ICD-10-CM

## 2023-11-25 DIAGNOSIS — R5383 Other fatigue: Secondary | ICD-10-CM

## 2023-11-25 DIAGNOSIS — M79671 Pain in right foot: Secondary | ICD-10-CM

## 2023-11-25 DIAGNOSIS — I951 Orthostatic hypotension: Secondary | ICD-10-CM

## 2023-11-25 DIAGNOSIS — K5792 Diverticulitis of intestine, part unspecified, without perforation or abscess without bleeding: Secondary | ICD-10-CM

## 2023-11-25 DIAGNOSIS — J4530 Mild persistent asthma, uncomplicated: Secondary | ICD-10-CM

## 2023-11-25 DIAGNOSIS — R768 Other specified abnormal immunological findings in serum: Secondary | ICD-10-CM

## 2023-11-25 DIAGNOSIS — M25572 Pain in left ankle and joints of left foot: Secondary | ICD-10-CM

## 2023-11-25 NOTE — Patient Instructions (Addendum)
Hand Exercises Hand exercises can be helpful for almost anyone. They can strengthen your hands and improve flexibility and movement. The exercises can also increase blood flow to the hands. These results can make your work and daily tasks easier for you. Hand exercises can be especially helpful for people who have joint pain from arthritis or nerve damage from using their hands over and over. These exercises can also help people who injure a hand. Exercises Most of these hand exercises are gentle stretching and motion exercises. It is usually safe to do them often throughout the day. Warming up your hands before exercise may help reduce stiffness. You can do this with gentle massage or by placing your hands in warm water for 10-15 minutes. It is normal to feel some stretching, pulling, tightness, or mild discomfort when you begin new exercises. In time, this will improve. Remember to always be careful and stop right away if you feel sudden, very bad pain or your pain gets worse. You want to get better and be safe. Ask your health care provider which exercises are safe for you. Do exercises exactly as told by your provider and adjust them as told. Do not begin these exercises until told by your provider. Knuckle bend or "claw" fist  Stand or sit with your arm, hand, and all five fingers pointed straight up. Make sure to keep your wrist straight. Gently bend your fingers down toward your palm until the tips of your fingers are touching your palm. Keep your big knuckle straight and only bend the small knuckles in your fingers. Hold this position for 10 seconds. Straighten your fingers back to your starting position. Repeat this exercise 5-10 times with each hand. Full finger fist  Stand or sit with your arm, hand, and all five fingers pointed straight up. Make sure to keep your wrist straight. Gently bend your fingers into your palm until the tips of your fingers are touching the middle of your  palm. Hold this position for 10 seconds. Extend your fingers back to your starting position, stretching every joint fully. Repeat this exercise 5-10 times with each hand. Straight fist  Stand or sit with your arm, hand, and all five fingers pointed straight up. Make sure to keep your wrist straight. Gently bend your fingers at the big knuckle, where your fingers meet your hand, and at the middle knuckle. Keep the knuckle at the tips of your fingers straight and try to touch the bottom of your palm. Hold this position for 10 seconds. Extend your fingers back to your starting position, stretching every joint fully. Repeat this exercise 5-10 times with each hand. Tabletop  Stand or sit with your arm, hand, and all five fingers pointed straight up. Make sure to keep your wrist straight. Gently bend your fingers at the big knuckle, where your fingers meet your hand, as far down as you can. Keep the small knuckles in your fingers straight. Think of forming a tabletop with your fingers. Hold this position for 10 seconds. Extend your fingers back to your starting position, stretching every joint fully. Repeat this exercise 5-10 times with each hand. Finger spread  Place your hand flat on a table with your palm facing down. Make sure your wrist stays straight. Spread your fingers and thumb apart from each other as far as you can until you feel a gentle stretch. Hold this position for 10 seconds. Bring your fingers and thumb tight together again. Hold this position for 10 seconds. Repeat  this exercise 5-10 times with each hand. Making circles  Stand or sit with your arm, hand, and all five fingers pointed straight up. Make sure to keep your wrist straight. Make a circle by touching the tip of your thumb to the tip of your index finger. Hold for 10 seconds. Then open your hand wide. Repeat this motion with your thumb and each of your fingers. Repeat this exercise 5-10 times with each hand. Thumb  motion  Sit with your forearm resting on a table and your wrist straight. Your thumb should be facing up toward the ceiling. Keep your fingers relaxed as you move your thumb. Lift your thumb up as high as you can toward the ceiling. Hold for 10 seconds. Bend your thumb across your palm as far as you can, reaching the tip of your thumb for the small finger (pinkie) side of your palm. Hold for 10 seconds. Repeat this exercise 5-10 times with each hand. Grip strengthening  Hold a stress ball or other soft ball in the middle of your hand. Slowly increase the pressure, squeezing the ball as much as you can without causing pain. Think of bringing the tips of your fingers into the middle of your palm. All of your finger joints should bend when doing this exercise. Hold your squeeze for 10 seconds, then relax. Repeat this exercise 5-10 times with each hand. Contact a health care provider if: Your hand pain or discomfort gets much worse when you do an exercise. Your hand pain or discomfort does not improve within 2 hours after you exercise. If you have either of these problems, stop doing these exercises right away. Do not do them again unless your provider says that you can. Get help right away if: You develop sudden, severe hand pain or swelling. If this happens, stop doing these exercises right away. Do not do them again unless your provider says that you can. This information is not intended to replace advice given to you by your health care provider. Make sure you discuss any questions you have with your health care provider. Document Revised: 11/03/2022 Document Reviewed: 11/03/2022 Elsevier Patient Education  2024 [employer]  Cervical Strain and Sprain Rehab Ask your health care provider which exercises are safe for you. Do exercises exactly as told by your health care provider and adjust them as directed. It is normal to feel mild stretching, pulling, tightness, or discomfort as you do  these exercises. Stop right away if you feel sudden pain or your pain gets worse. Do not begin these exercises until told by your health care provider. Stretching and range-of-motion exercises Cervical side bending  Using good posture, sit on a stable chair or stand up. Without moving your shoulders, slowly tilt your left / right ear to your shoulder until you feel a stretch in the neck muscles on the opposite side. You should be looking straight ahead. Hold for __________ seconds. Repeat with the other side of your neck. Repeat __________ times. Complete this exercise __________ times a day. Cervical rotation  Using good posture, sit on a stable chair or stand up. Slowly turn your head to the side as if you are looking over your left / right shoulder. Keep your eyes level with the ground. Stop when you feel a stretch along the side and the back of your neck. Hold for __________ seconds. Repeat this by turning to your other side. Repeat __________ times. Complete this exercise __________ times a day. Thoracic extension and pectoral stretch  Roll a towel or a small blanket so it is about 4 inches (10 cm) in diameter. Lie down on your back on a firm surface. Put the towel in the middle of your back across your spine. It should not be under your shoulder blades. Put your hands behind your head and let your elbows fall out to your sides. Hold for __________ seconds. Repeat __________ times. Complete this exercise __________ times a day. Strengthening exercises Upper cervical flexion  Lie on your back with a thin pillow behind your head or a small, rolled-up towel under your neck. Gently tuck your chin toward your chest and nod your head down to look toward your feet. Do not lift your head off the pillow. Hold for __________ seconds. Release the tension slowly. Relax your neck muscles completely before you repeat this exercise. Repeat __________ times. Complete this exercise __________  times a day. Cervical extension  Stand about 6 inches (15 cm) away from a wall, with your back facing the wall. Place a soft object, about 6-8 inches (15-20 cm) in diameter, between the back of your head and the wall. A soft object could be a small pillow, a ball, or a folded towel. Gently tilt your head back and press into the soft object. Keep your jaw and forehead relaxed. Hold for __________ seconds. Release the tension slowly. Relax your neck muscles completely before you repeat this exercise. Repeat __________ times. Complete this exercise __________ times a day. Posture and body mechanics Body mechanics refer to the movements and positions of your body while you do your daily activities. Posture is part of body mechanics. Good posture and healthy body mechanics can help to relieve stress in your body's tissues and joints. Good posture means that your spine is in its natural S-curve position (your spine is neutral), your shoulders are pulled back slightly, and your head is not tipped forward. The following are general guidelines for using improved posture and body mechanics in your everyday activities. Sitting  When sitting, keep your spine neutral and keep your feet flat on the floor. Use a footrest, if needed, and keep your thighs parallel to the floor. Avoid rounding your shoulders. Avoid tilting your head forward. When working at a desk or a computer, keep your desk at a height where your hands are slightly lower than your elbows. Slide your chair under your desk so you are close enough to maintain good posture. When working at a computer, place your monitor at a height where you are looking straight ahead and you do not have to tilt your head forward or downward to look at the screen. Standing  When standing, keep your spine neutral and keep your feet about hip-width apart. Keep a slight bend in your knees. Your ears, shoulders, and hips should line up. When you do a task in which you  stand in one place for a long time, place one foot up on a stable object that is 2-4 inches (5-10 cm) high, such as a footstool. This helps keep your spine neutral. Resting When lying down and resting, avoid positions that are most painful for you. Try to support your neck in a neutral position. You can use a contour pillow or a small rolled-up towel. Your pillow should support your neck but not push on it. This information is not intended to replace advice given to you by your health care provider. Make sure you discuss any questions you have with your health care provider. Document Revised: 02/22/2023  Document Reviewed: 05/11/2022 Elsevier Patient Education  2024 [employer].

## 2023-11-29 LAB — CBC WITH DIFFERENTIAL/PLATELET
Absolute Lymphocytes: 1110 {cells}/uL (ref 850–3900)
Absolute Monocytes: 310 {cells}/uL (ref 200–950)
Basophils Absolute: 68 {cells}/uL (ref 0–200)
Basophils Relative: 1.1 %
Eosinophils Absolute: 304 {cells}/uL (ref 15–500)
Eosinophils Relative: 4.9 %
HCT: 42.6 % (ref 35.0–45.0)
Hemoglobin: 13.6 g/dL (ref 11.7–15.5)
MCH: 27.9 pg (ref 27.0–33.0)
MCHC: 31.9 g/dL — ABNORMAL LOW (ref 32.0–36.0)
MCV: 87.3 fL (ref 80.0–100.0)
MPV: 10.8 fL (ref 7.5–12.5)
Monocytes Relative: 5 %
Neutro Abs: 4408 {cells}/uL (ref 1500–7800)
Neutrophils Relative %: 71.1 %
Platelets: 334 10*3/uL (ref 140–400)
RBC: 4.88 10*6/uL (ref 3.80–5.10)
RDW: 13.2 % (ref 11.0–15.0)
Total Lymphocyte: 17.9 %
WBC: 6.2 10*3/uL (ref 3.8–10.8)

## 2023-11-29 LAB — COMPLETE METABOLIC PANEL WITH GFR
AG Ratio: 1.8 (calc) (ref 1.0–2.5)
ALT: 10 U/L (ref 6–29)
AST: 15 U/L (ref 10–35)
Albumin: 4.2 g/dL (ref 3.6–5.1)
Alkaline phosphatase (APISO): 62 U/L (ref 37–153)
BUN: 20 mg/dL (ref 7–25)
CO2: 30 mmol/L (ref 20–32)
Calcium: 9.4 mg/dL (ref 8.6–10.4)
Chloride: 104 mmol/L (ref 98–110)
Creat: 0.81 mg/dL (ref 0.50–1.05)
Globulin: 2.3 g/dL (ref 1.9–3.7)
Glucose, Bld: 77 mg/dL (ref 65–99)
Potassium: 4.4 mmol/L (ref 3.5–5.3)
Sodium: 140 mmol/L (ref 135–146)
Total Bilirubin: 0.5 mg/dL (ref 0.2–1.2)
Total Protein: 6.5 g/dL (ref 6.1–8.1)
eGFR: 83 mL/min/{1.73_m2} (ref >=60)

## 2023-11-29 LAB — RNP ANTIBODY: Ribonucleic Protein(ENA) Antibody, IgG: <1 AI

## 2023-11-29 LAB — GLUCOSE 6 PHOSPHATE DEHYDROGENASE: G-6PDH: 16.7 U/g{Hb} (ref 7.0–20.5)

## 2023-11-29 LAB — ANA: Anti Nuclear Antibody (ANA): POSITIVE — AB

## 2023-11-29 LAB — ANTI-NUCLEAR AB-TITER (ANA TITER): ANA Titer 1: 1:640 {titer} — ABNORMAL HIGH

## 2023-11-29 LAB — CK: Total CK: 55 U/L (ref 20–243)

## 2023-11-29 LAB — C3 AND C4
C3 Complement: 121 mg/dL (ref 83–193)
C4 Complement: 26 mg/dL (ref 15–57)

## 2023-11-29 LAB — ANTI-SMITH ANTIBODY: ENA SM Ab Ser-aCnc: <1 AI

## 2023-11-29 LAB — ANTI-DNA ANTIBODY, DOUBLE-STRANDED: ds DNA Ab: 1 [IU]/mL

## 2023-11-29 LAB — SJOGRENS SYNDROME-A EXTRACTABLE NUCLEAR ANTIBODY: SSA (Ro) (ENA) Antibody, IgG: <1 AI

## 2023-11-29 LAB — SJOGRENS SYNDROME-B EXTRACTABLE NUCLEAR ANTIBODY: SSB (La) (ENA) Antibody, IgG: <1 AI

## 2023-11-29 LAB — ANTI-SCLERODERMA ANTIBODY: Scleroderma (Scl-70) (ENA) Antibody, IgG: <1 AI

## 2023-12-15 ENCOUNTER — Other Ambulatory Visit: Payer: Self-pay | Admitting: Family Medicine

## 2023-12-17 ENCOUNTER — Encounter: Payer: Self-pay | Admitting: Family Medicine

## 2023-12-17 ENCOUNTER — Ambulatory Visit: Payer: Managed Care, Other (non HMO) | Admitting: Family Medicine

## 2023-12-17 VITALS — BP 108/71 | HR 73 | Ht 65.5 in | Wt 186.0 lb

## 2023-12-17 DIAGNOSIS — E063 Autoimmune thyroiditis: Secondary | ICD-10-CM | POA: Diagnosis not present

## 2023-12-17 DIAGNOSIS — M542 Cervicalgia: Secondary | ICD-10-CM

## 2023-12-17 DIAGNOSIS — M791 Myalgia, unspecified site: Secondary | ICD-10-CM

## 2023-12-17 DIAGNOSIS — R635 Abnormal weight gain: Secondary | ICD-10-CM | POA: Diagnosis not present

## 2023-12-17 DIAGNOSIS — M2550 Pain in unspecified joint: Secondary | ICD-10-CM

## 2023-12-17 NOTE — Progress Notes (Signed)
 Christy Todd - 63 y.o. female MRN [id number]  Date of birth: November 19, 1960  Subjective Chief Complaint  Patient presents with   Medical Management of Chronic Issues    HPI Christy Todd is a 63 y.o. female here today for follow up.   She reports that she continues to have significant body aches and some fatigue related to this.  Significant ANA elevation and was referred to rheumatology.  Repeat ANA titer remains elevated. She has follow up with rheumatology next month.  They have not added any therapeutics at this time.   She did get relief with prednisone previously for a brief period of time.  She is already on duloxetine.  She has been doing some dry needling which she has found to be helpful.  She would like to have referral to PT>  Currently on mounjaro and is doing well with this.  Weight is fairly stable.   Doing ok with current strength of thyroid medication.  Last TSH in October was WNL.   ROS:  A comprehensive ROS was completed and negative except as noted per HPI  Allergies  Allergen Reactions   Ciprofloxacin Other (See Comments)    Detatched muscles and tendons    Apple Juice     ALL form of apple  Chemical burn to mouth and gums   Gluten Meal Other (See Comments)    Gut Distress   Benzoin    Claritin [Loratadine] Other (See Comments)    allergic psychosis   Septra [Sulfamethoxazole-Trimethoprim] Rash    Past Medical History:  Diagnosis Date   Arthritis    Asthma    COVID-19 07/23/2021   Depression    pt states she does not have depression. Cymbalta is used for pain control   Factor 5 Leiden mutation, heterozygous (HCC)    GERD (gastroesophageal reflux disease)    Hypertension    Hypothyroid    Pre-diabetes    pt states she is no longer pre-diabetic due to weight loss   Sleep apnea    on CPAP   Wears hearing aid in right ear     Past Surgical History:  Procedure Laterality Date   ABDOMINAL HYSTERECTOMY     ANKLE RECONSTRUCTION Left     CHOLECYSTECTOMY     COLONOSCOPY  12/09/2018   Dr. Ger[facility].  3 mm sessile polyp, hepatic flexure. Mild - Moderate diverticulosis, sigmoid colon.   CYSTOCELE REPAIR     ESOPHAGOGASTRODUODENOSCOPY  12/09/2018   Dr. Gerlene [facility]. Normal upper GI tract   RECTOCELE REPAIR     SALPINGECTOMY     SYNDACTLYLY REPAIR Right    SYNOVIAL CYST EXCISION Left    index finger   TENDON REPAIR     TOTAL ABDOMINAL HYSTERECTOMY     TOTAL HIP ARTHROPLASTY Right 08/25/2021   Procedure: RIGHT TOTAL HIP ARTHROPLASTY ANTERIOR APPROACH;  Surgeon: Tarry Kos, MD;  Location: [facility] OR;  Service: Orthopedics;  Laterality: Right;  3-C   TOTAL HIP ARTHROPLASTY Left 04/06/2022   Procedure: LEFT TOTAL HIP ARTHROPLASTY ANTERIOR APPROACH;  Surgeon: Tarry Kos, MD;  Location: [facility] OR;  Service: Orthopedics;  Laterality: Left;    Social History   Socioeconomic History   Marital status: Married    Spouse name: Tonni Mansour   Number of children: 2   Years of education: Not on file   Highest education level: Associate degree: occupational, Scientist, product/process development, or vocational program  Occupational History   Not on file  Tobacco Use   Smoking status: Never    Passive exposure: Past   Smokeless tobacco: Never  Vaping Use   Vaping status: Never Used  Substance and Sexual Activity   Alcohol use: Not Currently   Drug use: Never   Sexual activity: Yes    Partners: Male  Other Topics Concern   Not on file  Social History Narrative   Not on file   Social Drivers of Health   Financial Resource Strain: Low Risk  (12/14/2023)   Overall Financial Resource Strain (CARDIA)    Difficulty of Paying Living Expenses: Not hard at all  Food Insecurity: No Food Insecurity (12/14/2023)   Hunger Vital Sign    Worried About Running Out of Food in the Last Year: Never true    Ran Out of Food in the Last Year: Never true  Transportation Needs: No Transportation Needs  (12/14/2023)   PRAPARE - Administrator, Civil Service (Medical): No    Lack of Transportation (Non-Medical): No  Physical Activity: Sufficiently Active (12/14/2023)   Exercise Vital Sign    Days of Exercise per Week: 5 days    Minutes of Exercise per Session: 90 min  Stress: No Stress Concern Present (12/14/2023)   [employer] of Occupational Health - Occupational Stress Questionnaire    Feeling of Stress : Only a little  Social Connections: Socially Integrated (12/14/2023)   Social Connection and Isolation Panel [NHANES]    Frequency of Communication with Friends and Family: More than three times a week    Frequency of Social Gatherings with Friends and Family: More than three times a week    Attends Religious Services: More than 4 times per year    Active Member of Clubs or Organizations: Yes    Attends Engineer, structural: More than 4 times per year    Marital Status: Married    Family History  Problem Relation Age of Onset   Hashimoto's thyroiditis Mother    Stroke Mother    Heart attack Mother    Hypertension Father    Pancreatic cancer Father    Retinal detachment Brother    Lupus Maternal Aunt    Cancer Maternal Aunt    Diabetes Maternal Grandmother    Lupus Cousin    Colon cancer Neg Hx    Stomach cancer Neg Hx    Esophageal cancer Neg Hx     Health Maintenance  Topic Date Due   Hepatitis C Screening  Never done   MAMMOGRAM  01/22/2024 (Originally 07/10/2012)   Pneumococcal Vaccine 90-37 Years old (1 of 2 - PCV) 12/16/2024 (Originally [birth year]10/1968)   COVID-19 Vaccine (5 - 2024-25 season) 01/01/2025 (Originally 07/04/2023)   DTaP/Tdap/Td (2 - Td or Tdap) 08/27/2030   Colonoscopy  07/05/2031   INFLUENZA VACCINE  Completed   HIV Screening  Completed   Zoster Vaccines- Shingrix  Completed   HPV VACCINES  Aged Out      ----------------------------------------------------------------------------------------------------------------------------------------------------------------------------------------------------------------- Physical Exam BP 108/71 (BP Location: Left Arm, Patient Position: Sitting, Cuff Size: Large)   Pulse 73   Ht 5' 5.5" (1.664 m)   Wt 186 lb (84.4 kg)   LMP 11/02/1993   SpO2 100%   BMI 30.48 kg/m   Physical Exam Constitutional:      Appearance: Normal appearance.  HENT:     Head: Normocephalic and atraumatic.  Cardiovascular:     Rate and Rhythm: Normal rate and regular rhythm.  Neurological:     Mental Status: She is alert.  Psychiatric:        Mood and Affect: Mood normal.        Behavior: Behavior normal.     ------------------------------------------------------------------------------------------------------------------------------------------------------------------------------------------------------------------- Assessment and Plan  Hypothyroidism TSH well-controlled on recent labs.  Continue levothyroxine at current strength.  Abnormal weight gain She is doing well with Mounjaro at current strength.  She does have constipation but this is better than Wegovy.  Will plan to continue this at current strength.  Arthralgia She did have positive ANA and is being evaluated by rheumatology.  She did find dry needling helpful.  I will place referral back to physical therapy.   No orders of the defined types were placed in this encounter.   Return in about 4 months (around 04/15/2024) for weight management.    This visit occurred during the SARS-CoV-2 public health emergency.  Safety protocols were in place, including screening questions prior to the visit, additional usage of staff PPE, and extensive cleaning of exam room while observing appropriate contact time as indicated for disinfecting solutions.

## 2023-12-19 NOTE — Assessment & Plan Note (Signed)
 TSH well-controlled on recent labs.  Continue levothyroxine at current strength.

## 2023-12-19 NOTE — Assessment & Plan Note (Signed)
 She did have positive ANA and is being evaluated by rheumatology.  She did find dry needling helpful.  I will place referral back to physical therapy.

## 2023-12-19 NOTE — Assessment & Plan Note (Signed)
 She is doing well with Mounjaro at current strength.  She does have constipation but this is better than Wegovy.  Will plan to continue this at current strength.

## 2023-12-20 MED ORDER — AMOXICILLIN-POT CLAVULANATE 875-125 MG PO TABS: 1.0000 | ORAL_TABLET | Freq: Two times a day (BID) | ORAL | 0 refills | Status: DC

## 2023-12-23 NOTE — Progress Notes (Signed)
 Office Visit Note  Patient: Christy Todd             Date of Birth: [birth year]           MRN: [id number]             PCP: Everrett Coombe, DO Referring: Everrett Coombe, DO Visit Date: 01/06/2024 Occupation: @GUAROCC @  Subjective:  Pain in multiple joints  History of Present Illness: Christy Todd is a 63 y.o. female with osteoarthritis and positive ANA.  She returns for follow-up visit today.  She states she continues to have pain and discomfort in her cervical spine, lumbar spine, hands, knee joints and her feet.  She has been going to physical therapy which has been very helpful.  She states with the dry needling and it stretches the range of motion in her cervical spine is improved.  She is also noticed improvement in her lower back pain.  She has not had an episode of increased joint swelling since the last visit.  She continues to have dry mouth and dry eyes.  She has been using over-the-counter products which are helpful.    Activities of Daily Living:  Patient reports morning stiffness for 1 hour.   Patient Denies nocturnal pain.  Difficulty dressing/grooming: Denies Difficulty climbing stairs: Reports Difficulty getting out of chair: Denies Difficulty using hands for taps, buttons, cutlery, and/or writing: Reports  Review of Systems  Constitutional:  Positive for fatigue.  HENT:  Positive for mouth dryness. Negative for mouth sores.   Eyes:  Positive for dryness. Negative for pain.  Respiratory:  Negative for shortness of breath and difficulty breathing.   Cardiovascular:  Negative for chest pain and palpitations.  Gastrointestinal:  Negative for blood in stool, constipation and diarrhea.  Endocrine: Negative for increased urination.  Genitourinary:  Negative for involuntary urination.  Musculoskeletal:  Positive for joint pain, gait problem, joint pain, myalgias, morning stiffness and myalgias. Negative for joint swelling, muscle weakness and muscle tenderness.  Skin:   Positive for sensitivity to sunlight. Negative for color change, rash and hair loss.  Allergic/Immunologic: Positive for susceptible to infections.  Neurological:  Positive for dizziness and headaches.  Hematological:  Negative for swollen glands.  Psychiatric/Behavioral:  Positive for sleep disturbance. Negative for depressed mood. The patient is not nervous/anxious.     PMFS History:  Patient Active Problem List   Diagnosis Date Noted   Arthralgia 09/20/2023   Word finding difficulty 08/06/2023   Hypomagnesemia 03/31/2023   Diverticulitis 03/19/2023   Constipation 03/19/2023   Abnormal weight gain 01/23/2023   Dyspnea 09/04/2022   Chest pain 07/06/2022   Status post total replacement of left hip 04/06/2022   Clavicle pain 02/13/2022   Primary osteoarthritis of left hip 11/21/2021   Orthostatic hypotension 09/22/2021   Dizziness 09/22/2021   Drug-induced weight loss 09/22/2021   Status post total replacement of right hip 08/25/2021   Morbid obesity (HCC) 11/14/2020   Sleep behavior disorder, 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 Cymbalta is used for pain control   Factor 5 Leiden mutation, heterozygous (HCC)    GERD (gastroesophageal reflux disease)    Hypertension    Hypothyroid    Pre-diabetes    pt states she is no longer pre-diabetic due to weight loss   Sleep apnea    on CPAP   Wears hearing aid in right ear      Family History  Problem Relation Age of Onset   Hashimoto's thyroiditis Mother    Stroke Mother    Heart attack Mother    Hypertension Father    Pancreatic cancer Father    Retinal detachment Brother    Lupus Maternal Aunt    Cancer Maternal Aunt    Diabetes Maternal Grandmother    Lupus Cousin    Colon cancer Neg Hx    Stomach cancer Neg Hx    Esophageal cancer Neg Hx    Past Surgical History:  Procedure Laterality Date   ABDOMINAL HYSTERECTOMY     ANKLE RECONSTRUCTION Left    CHOLECYSTECTOMY     COLONOSCOPY  12/09/2018   Dr. [facility].  3 mm sessile polyp, hepatic flexure. Mild - Moderate diverticulosis, sigmoid colon.   CYSTOCELE REPAIR     ESOPHAGOGASTRODUODENOSCOPY  12/09/2018   Dr. [facility]. Normal upper GI tract   RECTOCELE REPAIR     SALPINGECTOMY     SYNDACTLYLY REPAIR Right    SYNOVIAL CYST EXCISION Left    index finger   TENDON REPAIR     TOTAL ABDOMINAL HYSTERECTOMY     TOTAL HIP ARTHROPLASTY Right 08/25/2021   Procedure: RIGHT TOTAL HIP ARTHROPLASTY ANTERIOR APPROACH;  Surgeon: Tarry Kos, MD;  Location: [facility];  Service: Orthopedics;  Laterality: Right;  3-C   TOTAL HIP ARTHROPLASTY Left 04/06/2022   Procedure: LEFT TOTAL HIP ARTHROPLASTY ANTERIOR APPROACH;  Surgeon: Tarry Kos, MD;  Location: [facility];  Service: Orthopedics;  Laterality: Left;   Social History   Social History Narrative   Not on file   Immunization History  Administered Date(s) Administered   Influenza, Seasonal, Injecte, Preservative Fre 08/06/2023   Influenza,inj,Quad PF,6+ Mos 10/03/2021, 09/04/2022   Influenza-Unspecified 08/30/2019, 08/20/2020   PFIZER(Purple Top)SARS-COV-2 Vaccination 12/22/2019, 01/15/2020, 08/20/2020, 10/20/2021   Tdap 08/27/2020   Zoster Recombinant(Shingrix) 05/23/2021, 11/07/2021     Objective: Vital Signs: BP 108/74 (BP Location: Left Arm, Patient Position: Sitting, Cuff Size:  Normal)   Pulse 71   Resp 14   Ht 5' 5.5" (1.664 m)   Wt 187 lb 9.6 oz (85.1 kg)   LMP 11/02/1993   BMI 30.74 kg/m    Physical Exam Vitals and nursing note reviewed.  Constitutional:      Appearance: She is well-developed.  HENT:     Head: Normocephalic and atraumatic.  Eyes:     Conjunctiva/sclera: Conjunctivae normal.  Cardiovascular:     Rate and Rhythm: Normal rate and regular rhythm.     Heart sounds: Normal heart sounds.  Pulmonary:     Effort: Pulmonary effort is normal.     Breath sounds: Normal breath sounds.  Abdominal:     General: Bowel sounds are normal.     Palpations: Abdomen is soft.  Musculoskeletal:     Cervical back: Normal range of motion.  Lymphadenopathy:     Cervical: No cervical adenopathy.  Skin:    General: Skin is warm and  dry.     Capillary Refill: Capillary refill takes less than 2 seconds.  Neurological:     Mental Status: She is alert and oriented to person, place, and time.  Psychiatric:        Behavior: Behavior normal.      Musculoskeletal Exam: She had limited lateral rotation of the cervical spine with minimal discomfort.  There was discomfort with range of motion of the lumbar spine with tenderness in the lower lumbar region.  Right sternoclavicular joint thickening was noted.  No synovitis was noted.  Shoulders, elbows, wrist joints, MCPs PIPs and DIPs been good range of motion.  She had bilateral CMC PIP and DIP thickening.  Hip joints were replaced and were in good range of motion.  Knee joints with good range of motion without any warmth swelling or effusion.  There was no tenderness over ankles.  She had bilateral first MTP PIP and DIP thickening.  CDAI Exam: CDAI Score: -- Patient Global: --; Provider Global: -- Swollen: --; Tender: -- Joint Exam 01/06/2024   No joint exam has been documented for this visit   There is currently no information documented on the homunculus. Go to the Rheumatology activity and complete the  homunculus joint exam.  Investigation: No additional findings.  Imaging: No results found.   Recent Labs: Lab Results  Component Value Date   WBC 6.2 11/25/2023   HGB 13.6 11/25/2023   PLT 334 11/25/2023   NA 140 11/25/2023   K 4.4 11/25/2023   CL 104 11/25/2023   CO2 30 11/25/2023   GLUCOSE 77 11/25/2023   BUN 20 11/25/2023   CREATININE 0.81 11/25/2023   BILITOT 0.5 11/25/2023   ALKPHOS 76 03/19/2023   AST 15 11/25/2023   ALT 10 11/25/2023   PROT 6.5 11/25/2023   ALBUMIN 3.5 03/19/2023   CALCIUM 9.4 11/25/2023   November 25, 2023 ANA 1: 640 NS, ENA (SCL 70, RNP, Smith, SSA, SSB, dsDNA) negative, complements normal, CK 55, G6PD normal  Speciality Comments: No specialty comments available.  Procedures:  No procedures performed Allergies: Ciprofloxacin, Apple juice, Gluten meal, Benzoin, Claritin [loratadine], and Septra [sulfamethoxazole-trimethoprim]   Assessment / Plan:     Visit Diagnoses: Positive ANA (antinuclear antibody) - Positive ANA, ENA negative, complements normal.  History of sicca symptoms and joint pain.  Lab results were reviewed with the patient at length.  There is no history of oral ulcers, nasal ulcers, malar rash, photosensitivity, Raynaud's or lymphadenopathy.  Patient had an episode of increased joint pain in the past which was treated with  intramuscular steroids and oral prednisone 11/24.  I advised her to contact us if she develops any increased joint swelling in the future.  Sicca syndrome-she gives history of dry mouth and dry eyes.  Over-the-counter products were discussed.  She has been using over-the-counter products.  SSA and SSB antibodies are negative.  She had no parotid swelling.  I offered pilocarpine which she declined.  Pain in both hands - No synovitis was noted.  Clinical and radiographic findings are suggestive of osteoarthritis.  X-ray findings were reviewed with the patient.  Joint protection muscle strengthening was  discussed.  Clavicle pain - History of right sternoclavicular fullness since 2020.  MRI 04/23 unremarkable.  Status post total replacement of both hips - RTHR 08/25/21, LTHR 04/06/22 by Dr. Roda Shutters.  She had good range of motion of bilateral hip joints without any discomfort.  Chronic pain of left ankle -she continues to have discomfort in her left  ankle.  Evaluated by Dr. Lilian Kapur in the past.  X-ray showed partial resection of fifth metatarsal and hallux varus deformity.  Pain in both feet -she cannot stop ongoing pain and discomfort in her feet.  History of Achilles tendon rupture in the past while on Cipro.  X-rays showed osteoarthritic changes and left fifth metatarsal partial resection.  Neck pain -she has been going to physical therapy and has noticed improvement in mobility of the cervical spine.  She states that dry needling has been helpful.  X-rays obtained at the last visit showed multilevel spondylosis and facet joint arthropathy.  X-rays were reviewed with the patient.  Chronic midline low back pain without sciatica - Chronic pain.  X-rays obtained at the last visit showed lumbar facet joint arthropathy.  X-rays were reviewed with the patient.  Patient has noticed improvement in the discomfort and mobility with the exercises and physical therapy.  Other fatigue-she cannot state some fatigue.  Other medical problems listed as follows:  Essential hypertension  Orthostatic hypotension  Diverticulitis  Hypothyroidism due to Hashimoto thyroiditis  Mild persistent asthma without complication  OSA on CPAP  Sleep behavior disorder, REM  Seizure disorder (HCC)  Family history of systemic lupus erythematosus-maternal aunt and niece  Orders: No orders of the defined types were placed in this encounter.  No orders of the defined types were placed in this encounter.    Follow-Up Instructions: Return in about 6 months (around 07/08/2024) for Osteoarthritis, +ANA.   Pollyann Savoy, MD  Note - This record has been created using Animal nutritionist.  Chart creation errors have been sought, but may not always  have been located. Such creation errors do not reflect on  the standard of medical care.

## 2024-01-06 ENCOUNTER — Ambulatory Visit: Payer: Managed Care, Other (non HMO) | Attending: Rheumatology | Admitting: Rheumatology

## 2024-01-06 ENCOUNTER — Encounter: Payer: Self-pay | Admitting: Rheumatology

## 2024-01-06 VITALS — BP 108/74 | HR 71 | Resp 14 | Ht 65.5 in | Wt 187.6 lb

## 2024-01-06 DIAGNOSIS — M3500 Sicca syndrome, unspecified: Secondary | ICD-10-CM

## 2024-01-06 DIAGNOSIS — M25572 Pain in left ankle and joints of left foot: Secondary | ICD-10-CM

## 2024-01-06 DIAGNOSIS — J4530 Mild persistent asthma, uncomplicated: Secondary | ICD-10-CM

## 2024-01-06 DIAGNOSIS — M898X1 Other specified disorders of bone, shoulder: Secondary | ICD-10-CM | POA: Diagnosis not present

## 2024-01-06 DIAGNOSIS — M79671 Pain in right foot: Secondary | ICD-10-CM

## 2024-01-06 DIAGNOSIS — Z96643 Presence of artificial hip joint, bilateral: Secondary | ICD-10-CM | POA: Diagnosis not present

## 2024-01-06 DIAGNOSIS — I10 Essential (primary) hypertension: Secondary | ICD-10-CM

## 2024-01-06 DIAGNOSIS — R5383 Other fatigue: Secondary | ICD-10-CM

## 2024-01-06 DIAGNOSIS — G8929 Other chronic pain: Secondary | ICD-10-CM

## 2024-01-06 DIAGNOSIS — I951 Orthostatic hypotension: Secondary | ICD-10-CM

## 2024-01-06 DIAGNOSIS — G40909 Epilepsy, unspecified, not intractable, without status epilepticus: Secondary | ICD-10-CM

## 2024-01-06 DIAGNOSIS — R768 Other specified abnormal immunological findings in serum: Secondary | ICD-10-CM | POA: Diagnosis not present

## 2024-01-06 DIAGNOSIS — K5792 Diverticulitis of intestine, part unspecified, without perforation or abscess without bleeding: Secondary | ICD-10-CM

## 2024-01-06 DIAGNOSIS — M79641 Pain in right hand: Secondary | ICD-10-CM | POA: Diagnosis not present

## 2024-01-06 DIAGNOSIS — Z8269 Family history of other diseases of the musculoskeletal system and connective tissue: Secondary | ICD-10-CM

## 2024-01-06 DIAGNOSIS — M545 Low back pain, unspecified: Secondary | ICD-10-CM

## 2024-01-06 DIAGNOSIS — G4733 Obstructive sleep apnea (adult) (pediatric): Secondary | ICD-10-CM

## 2024-01-06 DIAGNOSIS — M79672 Pain in left foot: Secondary | ICD-10-CM

## 2024-01-06 DIAGNOSIS — M542 Cervicalgia: Secondary | ICD-10-CM

## 2024-01-06 DIAGNOSIS — M79642 Pain in left hand: Secondary | ICD-10-CM

## 2024-01-06 DIAGNOSIS — E063 Autoimmune thyroiditis: Secondary | ICD-10-CM

## 2024-01-06 DIAGNOSIS — G4752 REM sleep behavior disorder: Secondary | ICD-10-CM

## 2024-01-06 DIAGNOSIS — R7689 Other specified abnormal immunological findings in serum: Secondary | ICD-10-CM

## 2024-01-06 NOTE — Patient Instructions (Signed)

## 2024-01-20 ENCOUNTER — Encounter: Payer: Self-pay | Admitting: Family Medicine

## 2024-01-21 MED ORDER — ZEPBOUND 10 MG/0.5ML ~~LOC~~ SOAJ: 10.0000 mg | SUBCUTANEOUS | 0 refills | Status: DC

## 2024-02-08 ENCOUNTER — Ambulatory Visit: Admitting: Physician Assistant

## 2024-02-08 ENCOUNTER — Encounter: Payer: Self-pay | Admitting: Physician Assistant

## 2024-02-08 ENCOUNTER — Other Ambulatory Visit (INDEPENDENT_AMBULATORY_CARE_PROVIDER_SITE_OTHER): Payer: Self-pay

## 2024-02-08 DIAGNOSIS — M25552 Pain in left hip: Secondary | ICD-10-CM

## 2024-02-08 NOTE — Progress Notes (Signed)
 Office Visit Note   Patient: Christy Todd           Date of Birth: February 18, 1961           MRN: [id number] Visit Date: 02/08/2024              Requested by: Everrett Coombe, DO [address] [place],  [place] [address] PCP: Everrett Coombe, DO   Assessment & Plan: Visit Diagnoses:  1. Pain in left hip     Plan: Impression is left lateral hip contusion.  We have discussed treating this symptomatically with rest, ice/heat and over-the-counter pain medication as needed.  She will follow-up with [place] as needed for this.  Call with concerns or questions.  Follow-Up Instructions: Return if symptoms worsen or fail to improve.   Orders:  Orders Placed This Encounter  Procedures   XR HIP UNILAT W OR W/O PELVIS 2-3 VIEWS LEFT   No orders of the defined types were placed in this encounter.     Procedures: No procedures performed   Clinical Data: No additional findings.   Subjective: Chief Complaint  Patient presents with   Left Hip - Injury    DOI 02/07/2024    HPI patient is a very pleasant 63 year old female who comes in today following an injury to the left hip.  She was sitting on a child's chair which was only approximately 18 inches off the ground yesterday when she slipped off landing on her left hip.  She has had increased pain to the left lateral hip since.  No pain into the groin or anterior thigh.  Her pain occurs when she is sitting, getting out of the car or applying pressure to the lateral hip.  Of note, she is status post left total hip replacement 04/06/2022.  Review of Systems as detailed in HPI.  All others reviewed and are negative.   Objective: Vital Signs: LMP 11/02/1993   Physical Exam well-developed well-nourished female no acute distress.  Alert and oriented x 3.  Ortho Exam left hip exam: No pain with logroll or FADIR testing.  She does have pain to the left lateral hip over the greater trochanter.  She is neurovascularly intact  distally.  Specialty Comments:  No specialty comments available.  Imaging: XR HIP UNILAT W OR W/O PELVIS 2-3 VIEWS LEFT Result Date: 02/08/2024 Well-seated prosthesis without complication.  No acute fracture noted.    PMFS History: Patient Active Problem List   Diagnosis Date Noted   Arthralgia 09/20/2023   Word finding difficulty 08/06/2023   Hypomagnesemia 03/31/2023   Diverticulitis 03/19/2023   Constipation 03/19/2023   Abnormal weight gain 01/23/2023   Dyspnea 09/04/2022   Chest pain 07/06/2022   Status post total replacement of left hip 04/06/2022   Clavicle pain 02/13/2022   Primary osteoarthritis of left hip 11/21/2021   Orthostatic hypotension 09/22/2021   Dizziness 09/22/2021   Drug-induced weight loss 09/22/2021   Status post total replacement of right hip 08/25/2021   Morbid obesity (HCC) 11/14/2020   Sleep behavior disorder, 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 Cymbalta is used for pain control   Factor 5 Leiden mutation, heterozygous (HCC)    GERD (gastroesophageal reflux disease)    Hypertension    Hypothyroid    Pre-diabetes    pt states she is no longer pre-diabetic due to weight loss   Sleep apnea    on CPAP   Wears hearing aid in right ear     Family History  Problem Relation Age of Onset   Hashimoto's thyroiditis Mother    Stroke Mother    Heart attack Mother    Hypertension Father     Pancreatic cancer Father    Retinal detachment Brother    Lupus Maternal Aunt    Cancer Maternal Aunt    Diabetes Maternal Grandmother    Lupus Cousin    Colon cancer Neg Hx    Stomach cancer Neg Hx    Esophageal cancer Neg Hx     Past Surgical History:  Procedure Laterality Date   ABDOMINAL HYSTERECTOMY     ANKLE RECONSTRUCTION Left    CHOLECYSTECTOMY     COLONOSCOPY  12/09/2018   Dr. [facility].  3 mm sessile polyp, hepatic flexure. Mild - Moderate diverticulosis, sigmoid colon.   CYSTOCELE REPAIR     ESOPHAGOGASTRODUODENOSCOPY  12/09/2018   Dr. [facility]. Normal upper GI tract   RECTOCELE REPAIR     SALPINGECTOMY     SYNDACTLYLY REPAIR Right    SYNOVIAL CYST EXCISION Left    index finger   TENDON REPAIR     TOTAL ABDOMINAL HYSTERECTOMY     TOTAL HIP ARTHROPLASTY Right 08/25/2021   Procedure: RIGHT TOTAL HIP ARTHROPLASTY ANTERIOR APPROACH;  Surgeon: Tarry Kos, MD;  Location: [facility];  Service: Orthopedics;  Laterality: Right;  3-C   TOTAL HIP ARTHROPLASTY Left 04/06/2022   Procedure: LEFT TOTAL HIP ARTHROPLASTY ANTERIOR APPROACH;  Surgeon: Tarry Kos, MD;  Location: [facility];  Service: Orthopedics;  Laterality: Left;   Social History   Occupational History   Not on file  Tobacco Use   Smoking status: Never    Passive exposure: Past   Smokeless tobacco: Never  Vaping Use   Vaping status: Never Used  Substance and Sexual Activity   Alcohol use: Not Currently   Drug use: Never   Sexual activity: Yes    Partners: Male

## 2024-03-02 ENCOUNTER — Telehealth: Admitting: Physician Assistant

## 2024-03-02 DIAGNOSIS — T7840XA Allergy, unspecified, initial encounter: Secondary | ICD-10-CM | POA: Diagnosis not present

## 2024-03-02 DIAGNOSIS — L509 Urticaria, unspecified: Secondary | ICD-10-CM | POA: Diagnosis not present

## 2024-03-02 MED ORDER — PREDNISONE 10 MG PO TABS: ORAL_TABLET | ORAL | 0 refills | Status: DC

## 2024-03-02 NOTE — Progress Notes (Signed)
 Virtual Visit Consent   Christy Todd, you are scheduled for a virtual visit with a [facility] provider today. Just as with appointments in the office, your consent must be obtained to participate. Your consent will be active for this visit and any virtual visit you may have with one of our providers in the next 365 days. If you have a MyChart account, a copy of this consent can be sent to you electronically.  As this is a virtual visit, video technology does not allow for your provider to perform a traditional examination. This may limit your provider's ability to fully assess your condition. If your provider identifies any concerns that need to be evaluated in person or the need to arrange testing (such as labs, EKG, etc.), we will make arrangements to do so. Although advances in technology are sophisticated, we cannot ensure that it will always work on either your end or our end. If the connection with a video visit is poor, the visit may have to be switched to a telephone visit. With either a video or telephone visit, we are not always able to ensure that we have a secure connection.  By engaging in this virtual visit, you consent to the provision of healthcare and authorize for your insurance to be billed (if applicable) for the services provided during this visit. Depending on your insurance coverage, you may receive a charge related to this service.  I need to obtain your verbal consent now. Are you willing to proceed with your visit today? Christy Todd has provided verbal consent on 03/02/2024 for a virtual visit (video or telephone). Angelia Kelp, PA-C  Date: 03/02/2024 2:01 PM   Virtual Visit via Video Note   I, Angelia Kelp, connected with  Christy Todd  ([id number], Feb 27, 1961) on 03/02/24 at  2:00 PM EDT by a video-enabled telemedicine application and verified that I am speaking with the correct person using two identifiers.  Location: Patient: Virtual Visit Location  Patient: Home Provider: Virtual Visit Location Provider: Home Office   I discussed the limitations of evaluation and management by telemedicine and the availability of in person appointments. The patient expressed understanding and agreed to proceed.    History of Present Illness: Christy Todd is a 63 y.o. who identifies as a female who was assigned female at birth, and is being seen today for hives.  HPI: Rash This is a new problem. The affected locations include the left arm, torso, back, left lower leg, left upper leg, right lower leg, right upper leg, right arm, right foot and left foot. The rash is characterized by burning, redness, itchiness and swelling. Associated with: food, possible cookie. Pertinent negatives include no congestion, cough, facial edema, fatigue, fever or shortness of breath. Past treatments include antihistamine (benadryl ). The treatment provided no relief.      Problems:  Patient Active Problem List   Diagnosis Date Noted   Arthralgia 09/20/2023   Word finding difficulty 08/06/2023   Hypomagnesemia 03/31/2023   Diverticulitis 03/19/2023   Constipation 03/19/2023   Abnormal weight gain 01/23/2023   Dyspnea 09/04/2022   Chest pain 07/06/2022   Status post total replacement of left hip 04/06/2022   Clavicle pain 02/13/2022   Primary osteoarthritis of left hip 11/21/2021   Orthostatic hypotension 09/22/2021   Dizziness 09/22/2021   Drug-induced weight loss 09/22/2021   Status post total replacement of right hip 08/25/2021   Morbid obesity (HCC) 11/14/2020   Sleep behavior disorder, 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  History of seizure 07/02/2020   Obesity with alveolar hypoventilation and body mass index (BMI) of 40 or greater (HCC) 07/02/2020   Severe obstructive sleep apnea-hypopnea syndrome 07/02/2020   OSA on CPAP 07/02/2020   Essential hypertension 05/07/2020   Chronic ankle pain 05/07/2020   Degenerative joint disease (DJD) of hip 05/07/2020   Seizure disorder  (HCC) 05/07/2020   Hypothyroidism 05/07/2020   Mild persistent asthma 05/07/2020    Allergies:  Allergies  Allergen Reactions   Ciprofloxacin Other (See Comments)    Detatched muscles and tendons    Apple Juice     ALL form of apple  Chemical burn to mouth and gums   Gluten Meal Other (See Comments)    Gut Distress   Benzoin    Claritin [Loratadine] Other (See Comments)    allergic psychosis   Septra [Sulfamethoxazole-Trimethoprim] Rash   Medications:  Current Outpatient Medications:    predniSONE  (DELTASONE ) 10 MG tablet, Days 1-4 take 4 tablets (40 mg) daily  Days 5-8 take 3 tablets (30 mg) daily, Days 9-11 take 2 tablets (20 mg) daily, Days 12-14 take 1 tablet (10 mg) daily., Disp: 37 tablet, Rfl: 0   albuterol  (VENTOLIN  HFA) 108 (90 Base) MCG/ACT inhaler, Inhale 2 puffs into the lungs every 6 (six) hours as needed for wheezing or shortness of breath., Disp: 8 g, Rfl: 0   amoxicillin -clavulanate (AUGMENTIN ) 875-125 MG tablet, Take 1 tablet by mouth 2 (two) times daily. (Patient not taking: Reported on 01/06/2024), Disp: 20 tablet, Rfl: 0   ASPIRIN  81 PO, Take 81 mg by mouth daily., Disp: , Rfl:    Cholecalciferol (VITAMIN D3) 50 MCG (2000 UT) TABS, Take 2,000 Units by mouth daily., Disp: , Rfl:    cyanocobalamin  (VITAMIN B12) 1000 MCG tablet, Take 2,000 mcg by mouth daily., Disp: , Rfl:    DULoxetine  (CYMBALTA ) 30 MG capsule, TAKE 2 BY MOUTH EVERY MORNING, 1 BY MOUTH EVERY AT NIGHT, Disp: 270 capsule, Rfl: 2   levothyroxine  (SYNTHROID ) 125 MCG tablet, TAKE 1 TABLET (125 MCG TOTAL) BY MOUTH AT BEDTIME., Disp: 90 tablet, Rfl: 1   lisinopril  (ZESTRIL ) 10 MG tablet, TAKE 1 TABLET BY MOUTH EVERY DAY, Disp: 90 tablet, Rfl: 3   Magnesium  200 MG TABS, Take 200 mg by mouth daily., Disp: , Rfl:    montelukast  (SINGULAIR ) 10 MG tablet, TAKE 1 TABLET BY MOUTH EVERYDAY AT BEDTIME, Disp: 90 tablet, Rfl: 1   Moringa Oleifera (MORINGA PO), Take by mouth., Disp: , Rfl:    niacin 50 MG tablet, Take 50  mg by mouth at bedtime., Disp: , Rfl:    NON FORMULARY, 1 each by Other route at bedtime. cpap, Disp: , Rfl:    omeprazole (PRILOSEC) 20 MG capsule, Take 20 mg by mouth at bedtime., Disp: , Rfl:    ondansetron  (ZOFRAN -ODT) 8 MG disintegrating tablet, Take 1 tablet (8 mg total) by mouth every 8 (eight) hours as needed for nausea or vomiting., Disp: 12 tablet, Rfl: 0   pyridOXINE (VITAMIN B6) 100 MG tablet, Take 100 mg by mouth daily., Disp: , Rfl:    spironolactone  (ALDACTONE ) 50 MG tablet, TAKE 1 TABLET BY MOUTH EVERY DAY, Disp: 90 tablet, Rfl: 1   tirzepatide  (ZEPBOUND ) 10 MG/0.5ML Pen, Inject 10 mg into the skin once a week., Disp: 6 mL, Rfl: 0   TURMERIC CURCUMIN PO, Take by mouth., Disp: , Rfl:   Observations/Objective: Patient is well-developed, well-nourished in no acute distress.  Resting comfortably at home.  Head is normocephalic, atraumatic.  No labored breathing.  Speech is clear and coherent with logical content.  Patient is alert and oriented at baseline.  Raised erythematous and pruritic welts c/w hives  Assessment and Plan: 1. Hives (Primary) - predniSONE  (DELTASONE ) 10 MG tablet; Days 1-4 take 4 tablets (40 mg) daily  Days 5-8 take 3 tablets (30 mg) daily, Days 9-11 take 2 tablets (20 mg) daily, Days 12-14 take 1 tablet (10 mg) daily.  Dispense: 37 tablet; Refill: 0  2. Allergic reaction, initial encounter  - Suspected food exposure, possible cookie she had last night causing hives - Will prescribe Prednisone  - Can continue oral benadryl  as needed - Can add Famotidine (Pepcid) 20mg  twice daily  - May use topical Hydrocortisone cream, benadryl  cream, and/or calamine lotion for itching - Cool compresses - Luke warm to cool showers - Seek in person evaluation if rash continues to spread or if any appear to become infected   Follow Up Instructions: I discussed the assessment and treatment plan with the patient. The patient was provided an opportunity to ask questions  and all were answered. The patient agreed with the plan and demonstrated an understanding of the instructions.  A copy of instructions were sent to the patient via MyChart unless otherwise noted below.    The patient was advised to call back or seek an in-person evaluation if the symptoms worsen or if the condition fails to improve as anticipated.    Angelia Kelp, PA-C

## 2024-03-02 NOTE — Patient Instructions (Signed)
 Christy Todd, thank you for joining Angelia Kelp, PA-C for today's virtual visit.  While this provider is not your primary care provider (PCP), if your PCP is located in our provider database this encounter information will be shared with them immediately following your visit.   A [facility] MyChart account gives you access to today's visit and all your visits, tests, and labs performed at [facility] " click here if you don't have a [facility] MyChart account or go to [email or web address]  Consent: (Patient) Christy Todd provided verbal consent for this virtual visit at the beginning of the encounter.  Current Medications:  Current Outpatient Medications:    predniSONE  (DELTASONE ) 10 MG tablet, Days 1-4 take 4 tablets (40 mg) daily  Days 5-8 take 3 tablets (30 mg) daily, Days 9-11 take 2 tablets (20 mg) daily, Days 12-14 take 1 tablet (10 mg) daily., Disp: 37 tablet, Rfl: 0   albuterol  (VENTOLIN  HFA) 108 (90 Base) MCG/ACT inhaler, Inhale 2 puffs into the lungs every 6 (six) hours as needed for wheezing or shortness of breath., Disp: 8 g, Rfl: 0   amoxicillin -clavulanate (AUGMENTIN ) 875-125 MG tablet, Take 1 tablet by mouth 2 (two) times daily. (Patient not taking: Reported on 01/06/2024), Disp: 20 tablet, Rfl: 0   ASPIRIN  81 PO, Take 81 mg by mouth daily., Disp: , Rfl:    Cholecalciferol (VITAMIN D3) 50 MCG (2000 UT) TABS, Take 2,000 Units by mouth daily., Disp: , Rfl:    cyanocobalamin  (VITAMIN B12) 1000 MCG tablet, Take 2,000 mcg by mouth daily., Disp: , Rfl:    DULoxetine  (CYMBALTA ) 30 MG capsule, TAKE 2 BY MOUTH EVERY MORNING, 1 BY MOUTH EVERY AT NIGHT, Disp: 270 capsule, Rfl: 2   levothyroxine  (SYNTHROID ) 125 MCG tablet, TAKE 1 TABLET (125 MCG TOTAL) BY MOUTH AT BEDTIME., Disp: 90 tablet, Rfl: 1   lisinopril  (ZESTRIL ) 10 MG tablet, TAKE 1 TABLET BY MOUTH EVERY DAY, Disp: 90 tablet, Rfl: 3   Magnesium  200 MG TABS, Take 200 mg by mouth daily., Disp: , Rfl:     montelukast  (SINGULAIR ) 10 MG tablet, TAKE 1 TABLET BY MOUTH EVERYDAY AT BEDTIME, Disp: 90 tablet, Rfl: 1   Moringa Oleifera (MORINGA PO), Take by mouth., Disp: , Rfl:    niacin 50 MG tablet, Take 50 mg by mouth at bedtime., Disp: , Rfl:    NON FORMULARY, 1 each by Other route at bedtime. cpap, Disp: , Rfl:    omeprazole (PRILOSEC) 20 MG capsule, Take 20 mg by mouth at bedtime., Disp: , Rfl:    ondansetron  (ZOFRAN -ODT) 8 MG disintegrating tablet, Take 1 tablet (8 mg total) by mouth every 8 (eight) hours as needed for nausea or vomiting., Disp: 12 tablet, Rfl: 0   pyridOXINE (VITAMIN B6) 100 MG tablet, Take 100 mg by mouth daily., Disp: , Rfl:    spironolactone  (ALDACTONE ) 50 MG tablet, TAKE 1 TABLET BY MOUTH EVERY DAY, Disp: 90 tablet, Rfl: 1   tirzepatide  (ZEPBOUND ) 10 MG/0.5ML Pen, Inject 10 mg into the skin once a week., Disp: 6 mL, Rfl: 0   TURMERIC CURCUMIN PO, Take by mouth., Disp: , Rfl:    Medications ordered in this encounter:  Meds ordered this encounter  Medications   predniSONE  (DELTASONE ) 10 MG tablet    Sig: Days 1-4 take 4 tablets (40 mg) daily  Days 5-8 take 3 tablets (30 mg) daily, Days 9-11 take 2 tablets (20 mg) daily, Days 12-14 take 1 tablet (10 mg) daily.  Dispense:  37 tablet    Refill:  0    Supervising Provider:   Corine Dice [[id number]]     *If you need refills on other medications prior to your next appointment, please contact your pharmacy*  Follow-Up: Call back or seek an in-person evaluation if the symptoms worsen or if the condition fails to improve as anticipated.  [facility] [phone]  Other Instructions  Hives Hives (urticaria) are itchy, red, swollen areas of skin. They can show up on any part of the body. They often fade within 24 hours (acute hives). If you get new hives after the old ones fade and the cycle goes on for many days or weeks, it is called chronic hives. Hives do not spread from person to person (are not  contagious). Hives can happen when your body reacts to something you are allergic to (allergen) or to something that irritates your skin. When you are exposed to something that triggers hives, your body releases a chemical called histamine. This causes redness, itching, and swelling. Hives can show up right after you are exposed to a trigger or hours later. What are the causes? Hives may be caused by: Food allergies. Insect bites or stings. Allergies to pollen or pets. Spending time in sunlight, heat, or cold (exposure). Exercise. Stress. You can also get hives from other conditions and treatments. These include: Viruses, such as the common cold. Bacterial infections, such as urinary tract infections and strep throat. Certain medicines. Contact with latex or chemicals. Allergy shots. Blood transfusions. In some cases, the cause of hives is not known (idiopathic hives). What increases the risk? You are more likely to get hives if: You are female. You have food allergies. Hives are more common if you are allergic to citrus fruits, milk, eggs, peanuts, tree nuts, or shellfish. You are allergic to: Medicines. Latex. Insects. Animals. Pollen. What are the signs or symptoms? Common symptoms of hives include raised, itchy, red or white bumps or patches on your skin. These areas may: Become large and swollen (welts). Quickly change shape and location. This may happen more than once. Be separate hives or connect over a large area of skin. Sting or become painful. Turn white when pressed in the center (blanch). In severe cases, your hands, feet, and face may also become swollen. This may happen if hives form deeper in your skin. How is this diagnosed? Hives may be diagnosed based on your symptoms, medical history, and a physical exam. You may have skin, pee (urine), or blood tests done. These can help find out what is causing your hives and rule out other health issues. You may also have  a biopsy done. This is when a small piece of skin is removed for testing. How is this treated? Treatment for hives depends on the cause and on how severe your symptoms are. You may be told to use cool, wet cloths (cool compresses) or to take cool showers to relieve itching. Treatment may also include: Medicines to help: Relieve itching (antihistamines). Reduce swelling (corticosteroids). Treat infection (antibiotics). An injectable medicine called omalizumab. You may need this if you have chronic idiopathic hives and still have symptoms even after you are treated with antihistamines. In severe cases, you may need to use a device filled with medicine that gives an emergency shot of epinephrine (auto-injector pen) to prevent a very bad allergic reaction (anaphylactic reaction). Follow these instructions at home: Medicines Take and apply over-the-counter and prescription medicines only as told  by your health care provider. If you were prescribed antibiotics, take them as told by your provider. Do not stop using the antibiotic even if you start to feel better. Skin care Apply cool compresses to the affected areas. Do not scratch or rub your skin. General instructions Do not take hot showers or baths. This can make itching worse. Do not wear tight-fitting clothing. Use sunscreen. Wear protective clothing when you are outside. Avoid anything that causes your hives. Keep a journal to help track what causes your hives. Write down: What medicines you take. What you eat and drink. What products you use on your skin. Keep all follow-up visits. Your provider will track how well treatment is working. Contact a health care provider if: Your symptoms do not get better with medicine. Your joints are painful or swollen. You have a fever. You have pain in your abdomen. Get help right away if: Your tongue, lips, or eyelids swell. Your chest or throat feels tight. You have trouble breathing or  swallowing. These symptoms may be an emergency. Use the auto-injector pen right away. Then call 911. Do not wait to see if the symptoms will go away. Do not drive yourself to the hospital. This information is not intended to replace advice given to you by your health care provider. Make sure you discuss any questions you have with your health care provider. Document Revised: 07/16/2022 Document Reviewed: 07/07/2022 Elsevier Patient Education  2024 Elsevier Inc.   If you have been instructed to have an in-person evaluation today at a local Urgent Care facility, please use the link below. It will take you to a list of all of our available [facility], including address, phone number and hours of operation. Please do not delay care.  [facility]  If you or a family member do not have a primary care provider, use the link below to schedule a visit and establish care. When you choose a [facility] primary care physician or advanced practice provider, you gain a long-term partner in health. Find a Primary Care Provider  Learn more about [facility]'s in-office and virtual care options: [facility] - Get Care Now

## 2024-03-14 ENCOUNTER — Other Ambulatory Visit: Payer: Self-pay | Admitting: Family Medicine

## 2024-03-14 DIAGNOSIS — J4530 Mild persistent asthma, uncomplicated: Secondary | ICD-10-CM

## 2024-03-22 ENCOUNTER — Ambulatory Visit: Payer: Self-pay

## 2024-03-22 NOTE — Telephone Encounter (Signed)
 Chief Complaint: ear pain Symptoms: ear pain, fullness, cough Frequency: 1 wk Pertinent Negatives: Patient denies dizziness, unsteady gait, drainage, fever Disposition: [] ED /[] Urgent Care (no appt availability in office) / [x] Appointment(In office/virtual)/ []  [facility]/ [] Home Care/ [] Refused Recommended Disposition /[] [facility]/ []  Follow-up with PCP Additional Notes: Pt reports L ear pain for 1 wk with recent worsening. Pt endorses 9/10 pain. States Tylenol  and ibuprofen  are effective. Pt reports decreased hearing in that ear as well. Pt also reports a cough since 5/12 with clear nasal congestion and sputum. Pt denies fever. RN scheduled pt tomorrow in the office at 1010. RN advised pt if she develops worsening pain, fever, drainage from the ear, dizziness, unsteady gait to seek emergent care. Pt verbalized understanding.     Copied from CRM [id number]. Topic: Clinical - Red Word Triage >> Mar 22, 2024  8:30 AM Retta Caster wrote: Red Word that prompted transfer to Nurse Triage: LT ear infection 1 week but worse 1-2 days 9/10 pain level. Can not unstuff ear Reason for Disposition  Earache  (Exceptions: brief ear pain of < 60 minutes duration, earache occurring during air travel  Answer Assessment - Initial Assessment Questions 1. LOCATION: "Which ear is involved?"     L ear 2. ONSET: "When did the ear start hurting"      1 wk, worsening the last 1-2 days 3. SEVERITY: "How bad is the pain?"  (Scale 1-10; mild, moderate or severe)   - MILD (1-3): doesn't interfere with normal activities    - MODERATE (4-7): interferes with normal activities or awakens from sleep    - SEVERE (8-10): excruciating pain, unable to do any normal activities      9/10 4. URI SYMPTOMS: "Do you have a runny nose or cough?"     Cough  5. FEVER: "Do you have a fever?" If Yes, ask: "What is your temperature, how was it measured, and when did it start?"     No  6. CAUSE: "Have you been swimming  recently?", "How often do you use Q-TIPS?", "Have you had any recent air travel or scuba diving?"     No  7. OTHER SYMPTOMS: "Do you have any other symptoms?" (e.g., headache, stiff neck, dizziness, vomiting, runny nose, decreased hearing)     On 5/1 she had a "huge allergic reaction" w/ hives; was placed on steroids which pt took for 14 days, day 12 was the second day of one pill. That day she developed a wet cough. Pt states she is taking benzonatate  . Fluid in the left ear now. Clear nasal drainage. Clear sputum w/ cough. Ear is clogged, feels like it's going to burst. Can't wear hearing aid  Protocols used: Louie Rover

## 2024-03-22 NOTE — Telephone Encounter (Signed)
 Patient cancelled their appointment via MyChart.

## 2024-03-23 ENCOUNTER — Ambulatory Visit: Admitting: Family Medicine

## 2024-03-26 ENCOUNTER — Other Ambulatory Visit: Payer: Self-pay | Admitting: Family Medicine

## 2024-04-07 ENCOUNTER — Other Ambulatory Visit (INDEPENDENT_AMBULATORY_CARE_PROVIDER_SITE_OTHER): Payer: Self-pay

## 2024-04-07 ENCOUNTER — Encounter: Payer: Self-pay | Admitting: Orthopaedic Surgery

## 2024-04-07 ENCOUNTER — Ambulatory Visit: Payer: Managed Care, Other (non HMO) | Admitting: Orthopaedic Surgery

## 2024-04-07 DIAGNOSIS — Z96642 Presence of left artificial hip joint: Secondary | ICD-10-CM

## 2024-04-07 DIAGNOSIS — Z96641 Presence of right artificial hip joint: Secondary | ICD-10-CM

## 2024-04-07 NOTE — Progress Notes (Signed)
 Post-Op Visit Note   Patient: Christy Todd           Date of Birth: [birth year]           MRN: [id number] Visit Date: 04/07/2024 PCP: Adela Holter, DO   Assessment & Plan:  Chief Complaint:  Chief Complaint  Patient presents with   Right Hip - Follow-up    Right THA 08/25/2021   Left Hip - Follow-up    Left THA 04/06/2022   Visit Diagnoses:  1. Status post total replacement of left hip   2. Status post total replacement of right hip     Plan: History of Present Illness Christy Todd is a 63 year old female who presents for a two-year follow-up after sequential bilateral hip replacements.  She underwent right hip replacement on August 25, 2021, and left hip replacement on April 06, 2022. Both hip replacements are functioning well without issues. In April, she experienced a fall on her left side, resulting in bruising and pain, which resolved after three weeks. She engages in regular walking and physical activity, contributing to significant weight loss. There is no pain with hip rotation, and surgical scars are well-healed.  Physical Exam SKIN: Surgical scars well-healed, almost not visible.  Painless motion of the hips.  Normal gait pattern.  Assessment and Plan Bilateral hip replacements Right hip replaced October 2022, left hip June 2023. No current issues. Active lifestyle supports hip function. Excellent implant longevity expected. - No further follow-up unless issues arise.  Follow-Up Instructions: Return if symptoms worsen or fail to improve.   Orders:  Orders Placed This Encounter  Procedures   XR Pelvis 1-2 Views   No orders of the defined types were placed in this encounter.   Imaging: XR Pelvis 1-2 Views Result Date: 04/07/2024 Xrays show stable bilateral total hip replacements without complications   PMFS History: Patient Active Problem List   Diagnosis Date Noted   Arthralgia 09/20/2023   Word finding difficulty 08/06/2023   Hypomagnesemia  03/31/2023   Diverticulitis 03/19/2023   Constipation 03/19/2023   Abnormal weight gain 01/23/2023   Dyspnea 09/04/2022   Chest pain 07/06/2022   Status post total replacement of left hip 04/06/2022   Clavicle pain 02/13/2022   Primary osteoarthritis of left hip 11/21/2021   Orthostatic hypotension 09/22/2021   Dizziness 09/22/2021   Drug-induced weight loss 09/22/2021   Status post total replacement of right hip 08/25/2021   Morbid obesity (HCC) 11/14/2020   Sleep behavior disorder, 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   History of seizure 07/02/2020   Obesity with alveolar hypoventilation and body mass index (BMI) of 40 or greater (HCC) 07/02/2020   Severe obstructive sleep apnea-hypopnea syndrome 07/02/2020   OSA on CPAP 07/02/2020   Essential hypertension 05/07/2020   Chronic ankle pain 05/07/2020   Degenerative joint disease (DJD) of hip 05/07/2020   Seizure disorder (HCC) 05/07/2020   Hypothyroidism 05/07/2020   Mild persistent asthma 05/07/2020   Past Medical History:  Diagnosis Date   Arthritis    Asthma    COVID-19 07/23/2021   Depression    pt states she does not have depression. Cymbalta  is used for pain control   Factor 5 Leiden mutation, heterozygous (HCC)    GERD (gastroesophageal reflux disease)    Hypertension    Hypothyroid    Pre-diabetes    pt states she is no longer pre-diabetic due to weight loss   Sleep apnea    on CPAP   Wears hearing aid in right  ear     Family History  Problem Relation Age of Onset   Hashimoto's thyroiditis Mother    Stroke Mother    Heart attack Mother    Hypertension Father    Pancreatic cancer Father    Retinal detachment Brother    Lupus Maternal Aunt    Cancer Maternal Aunt    Diabetes Maternal Grandmother    Lupus Cousin    Colon cancer Neg Hx    Stomach cancer Neg Hx    Esophageal cancer Neg Hx     Past Surgical History:  Procedure Laterality Date   ABDOMINAL HYSTERECTOMY     ANKLE RECONSTRUCTION Left    CHOLECYSTECTOMY      COLONOSCOPY  12/09/2018   [facility]. [facility].  3 mm sessile polyp, hepatic flexure. Mild - Moderate diverticulosis, sigmoid colon.   CYSTOCELE REPAIR     ESOPHAGOGASTRODUODENOSCOPY  12/09/2018   Dr. [facility]. Normal upper GI tract   RECTOCELE REPAIR     SALPINGECTOMY     SYNDACTLYLY REPAIR Right    SYNOVIAL CYST EXCISION Left    index finger   TENDON REPAIR     TOTAL ABDOMINAL HYSTERECTOMY     TOTAL HIP ARTHROPLASTY Right 08/25/2021   Procedure: RIGHT TOTAL HIP ARTHROPLASTY ANTERIOR APPROACH;  Surgeon: Wes Hamman, MD;  Location: [facility] OR;  Service: Orthopedics;  Laterality: Right;  3-C   TOTAL HIP ARTHROPLASTY Left 04/06/2022   Procedure: LEFT TOTAL HIP ARTHROPLASTY ANTERIOR APPROACH;  Surgeon: Wes Hamman, MD;  Location: [facility] OR;  Service: Orthopedics;  Laterality: Left;   Social History   Occupational History   Not on file  Tobacco Use   Smoking status: Never    Passive exposure: Past   Smokeless tobacco: Never  Vaping Use   Vaping status: Never Used  Substance and Sexual Activity   Alcohol use: Not Currently   Drug use: Never   Sexual activity: Yes    Partners: Male

## 2024-04-10 ENCOUNTER — Other Ambulatory Visit: Payer: Self-pay

## 2024-04-10 ENCOUNTER — Encounter: Payer: Self-pay | Admitting: Physician Assistant

## 2024-04-10 ENCOUNTER — Ambulatory Visit (INDEPENDENT_AMBULATORY_CARE_PROVIDER_SITE_OTHER): Admitting: Physician Assistant

## 2024-04-10 VITALS — BP 129/86 | HR 85 | Temp 98.6°F | Ht 65.0 in | Wt 178.0 lb

## 2024-04-10 DIAGNOSIS — I10 Essential (primary) hypertension: Secondary | ICD-10-CM

## 2024-04-10 DIAGNOSIS — H6992 Unspecified Eustachian tube disorder, left ear: Secondary | ICD-10-CM | POA: Diagnosis not present

## 2024-04-10 DIAGNOSIS — T7840XS Allergy, unspecified, sequela: Secondary | ICD-10-CM

## 2024-04-10 DIAGNOSIS — E662 Morbid (severe) obesity with alveolar hypoventilation: Secondary | ICD-10-CM

## 2024-04-10 DIAGNOSIS — L509 Urticaria, unspecified: Secondary | ICD-10-CM | POA: Insufficient documentation

## 2024-04-10 DIAGNOSIS — G4733 Obstructive sleep apnea (adult) (pediatric): Secondary | ICD-10-CM

## 2024-04-10 DIAGNOSIS — H9192 Unspecified hearing loss, left ear: Secondary | ICD-10-CM | POA: Insufficient documentation

## 2024-04-10 DIAGNOSIS — T7840XA Allergy, unspecified, initial encounter: Secondary | ICD-10-CM | POA: Insufficient documentation

## 2024-04-10 MED ORDER — FLUTICASONE PROPIONATE 50 MCG/ACT NA SUSP: 2.0000 | Freq: Every day | NASAL | 0 refills | Status: DC

## 2024-04-10 MED ORDER — METHYLPREDNISOLONE 4 MG PO TBPK: ORAL_TABLET | ORAL | 0 refills | Status: DC

## 2024-04-10 MED ORDER — ZEPBOUND 12.5 MG/0.5ML ~~LOC~~ SOAJ: 12.5000 mg | SUBCUTANEOUS | 1 refills | Status: DC

## 2024-04-10 NOTE — Patient Instructions (Addendum)
 Continue singulair  Re-start flonase daily and afrin for 2 days every 5 days Medrol  dose pack if needed or symptoms worsening Take pepcid on trip for allergic reaction Will make referral to ENT  Eustachian Tube Dysfunction  Eustachian tube dysfunction refers to a condition in which a blockage develops in the narrow passage that connects the middle ear to the back of the nose (eustachian tube). The eustachian tube regulates air pressure in the middle ear by letting air move between the ear and nose. It also helps to drain fluid from the middle ear space. Eustachian tube dysfunction can affect one or both ears. When the eustachian tube does not function properly, air pressure, fluid, or both can build up in the middle ear. What are the causes? This condition occurs when the eustachian tube becomes blocked or cannot open normally. Common causes of this condition include: Ear infections. Colds and other infections that affect the nose, mouth, and throat (upper respiratory tract). Allergies. Irritation from cigarette smoke. Irritation from stomach acid coming up into the esophagus (gastroesophageal reflux). The esophagus is the part of the body that moves food from the mouth to the stomach. Sudden changes in air pressure, such as from descending in an airplane or scuba diving. Abnormal growths in the nose or throat, such as: Growths that line the nose (nasal polyps). Abnormal growth of cells (tumors). Enlarged tissue at the back of the throat (adenoids). What increases the risk? You are more likely to develop this condition if: You smoke. You are overweight. You are a child who has: Certain birth defects of the mouth, such as cleft palate. Large tonsils or adenoids. What are the signs or symptoms? Common symptoms of this condition include: A feeling of fullness in the ear. Ear pain. Clicking or popping noises in the ear. Ringing in the ear (tinnitus). Hearing loss. Loss of  balance. Dizziness. Symptoms may get worse when the air pressure around you changes, such as when you travel to an area of high elevation, fly on an airplane, or go scuba diving. How is this diagnosed? This condition may be diagnosed based on: Your symptoms. A physical exam of your ears, nose, and throat. Tests, such as those that measure: The movement of your eardrum. Your hearing (audiometry). How is this treated? Treatment depends on the cause and severity of your condition. In mild cases, you may relieve your symptoms by moving air into your ears. This is called "popping the ears." In more severe cases, or if you have symptoms of fluid in your ears, treatment may include: Medicines to relieve congestion (decongestants). Medicines that treat allergies (antihistamines). Nasal sprays or ear drops that contain medicines that reduce swelling (steroids). A procedure to drain the fluid in your eardrum. In this procedure, a small tube may be placed in the eardrum to: Drain the fluid. Restore the air in the middle ear space. A procedure to insert a balloon device through the nose to inflate the opening of the eustachian tube (balloon dilation). Follow these instructions at home: Lifestyle Do not do any of the following until your health care provider approves: Travel to high altitudes. Fly in airplanes. Work in a Estate agent or room. Scuba dive. Do not use any products that contain nicotine or tobacco. These products include cigarettes, chewing tobacco, and vaping devices, such as e-cigarettes. If you need help quitting, ask your health care provider. Keep your ears dry. Wear fitted earplugs during showering and bathing. Dry your ears completely after. General instructions Take over-the-counter  and prescription medicines only as told by your health care provider. Use techniques to help pop your ears as recommended by your health care provider. These may include: Chewing  gum. Yawning. Frequent, forceful swallowing. Closing your mouth, holding your nose closed, and gently blowing as if you are trying to blow air out of your nose. Keep all follow-up visits. This is important. Contact a health care provider if: Your symptoms do not go away after treatment. Your symptoms come back after treatment. You are unable to pop your ears. You have: A fever. Pain in your ear. Pain in your head or neck. Fluid draining from your ear. Your hearing suddenly changes. You become very dizzy. You lose your balance. Get help right away if: You have a sudden, severe increase in any of your symptoms. Summary Eustachian tube dysfunction refers to a condition in which a blockage develops in the eustachian tube. It can be caused by ear infections, allergies, inhaled irritants, or abnormal growths in the nose or throat. Symptoms may include ear pain or fullness, hearing loss, or ringing in the ears. Mild cases are treated with techniques to unblock the ears, such as yawning or chewing gum. More severe cases are treated with medicines or procedures. This information is not intended to replace advice given to you by your health care provider. Make sure you discuss any questions you have with your health care provider. Document Revised: 12/30/2020 Document Reviewed: 12/30/2020 Elsevier Patient Education  2024 [employer].

## 2024-04-10 NOTE — Progress Notes (Unsigned)
   Established Patient Office Visit  Subjective   Patient ID: Christy Todd, female    DOB: 1961-07-25  Age: 63 y.o. MRN: [id number]  No chief complaint on file.   HPI  Wears bilateral hearing aids. No hearing in right ear. Worsening in left. Sees audiology.    {History (Optional):23778}  ROS    Objective:     LMP 11/02/1993  {Vitals History (Optional):23777}  Physical Exam   No results found for any visits on 04/10/24.  {Labs (Optional):23779}  The 10-year ASCVD risk score (Arnett DK, et al., 2019) is: 3.2%    Assessment & Plan:   Problem List Items Addressed This Visit   None   No follow-ups on file.    Shamon Cothran, PA-C

## 2024-04-11 ENCOUNTER — Encounter: Payer: Self-pay | Admitting: Physician Assistant

## 2024-04-28 ENCOUNTER — Encounter: Payer: Self-pay | Admitting: Family Medicine

## 2024-04-28 ENCOUNTER — Ambulatory Visit (INDEPENDENT_AMBULATORY_CARE_PROVIDER_SITE_OTHER): Payer: Managed Care, Other (non HMO) | Admitting: Family Medicine

## 2024-04-28 VITALS — BP 111/76 | HR 84 | Ht 65.0 in | Wt 184.0 lb

## 2024-04-28 DIAGNOSIS — E063 Autoimmune thyroiditis: Secondary | ICD-10-CM | POA: Diagnosis not present

## 2024-04-28 DIAGNOSIS — R635 Abnormal weight gain: Secondary | ICD-10-CM

## 2024-04-28 DIAGNOSIS — Z1231 Encounter for screening mammogram for malignant neoplasm of breast: Secondary | ICD-10-CM

## 2024-04-28 DIAGNOSIS — L509 Urticaria, unspecified: Secondary | ICD-10-CM

## 2024-04-28 DIAGNOSIS — G4733 Obstructive sleep apnea (adult) (pediatric): Secondary | ICD-10-CM

## 2024-04-28 DIAGNOSIS — R21 Rash and other nonspecific skin eruption: Secondary | ICD-10-CM | POA: Diagnosis not present

## 2024-04-28 DIAGNOSIS — I10 Essential (primary) hypertension: Secondary | ICD-10-CM

## 2024-04-28 NOTE — Progress Notes (Signed)
 Christy Todd - 63 y.o. female MRN [id number]  Date of birth: 1961/03/25  Subjective Chief Complaint  Patient presents with   Weight Check    HPI Christy Todd is a 63 y.o. female here today for follow up visit.    She reports that she is doing ok.   She has had issues with recurrent rashes.  These have been hive like in nature.   She tried prednisone  along with antihistamines with improvement.  She has noticed that this usually occurs when consuming foods with wheat products.  She does have history of positive ANA and has seen rheumatology.  Dx w/ Sicca syndrome.  Never tested for celiac.  She does have some intermittent episodes of diarrhea  She remains on zepbound  12.5mg  for weight management.  She has donw quite well with GLP-1 medications for maintianing her weight.  She does state that her insurance is going to stop covering Zepbound  but will still cover wegovy .  .   BP remains well controlled with lisinopril  10mg  daily.  No side effects at current strength.    ROS:  A comprehensive ROS was completed and negative except as noted per HPI  Allergies  Allergen Reactions   Ciprofloxacin Other (See Comments)    Detatched muscles and tendons    Apple Juice     ALL form of apple  Chemical burn to mouth and gums   Gluten Meal Other (See Comments)    Gut Distress   Benzoin    Claritin [Loratadine] Other (See Comments)    allergic psychosis   Septra [Sulfamethoxazole-Trimethoprim] Rash    Past Medical History:  Diagnosis Date   Allergy 1976   Arthritis    Asthma    COVID-19 07/23/2021   Depression    pt states she does not have depression. Cymbalta  is used for pain control   Factor 5 Leiden mutation, heterozygous (HCC)    GERD (gastroesophageal reflux disease)    Hypertension    Hypothyroid    Pre-diabetes    pt states she is no longer pre-diabetic due to weight loss   Seizures (HCC) 9:8:2019   Sleep apnea    on CPAP   Wears hearing aid in right ear     Past  Surgical History:  Procedure Laterality Date   ABDOMINAL HYSTERECTOMY     ANKLE RECONSTRUCTION Left    CHOLECYSTECTOMY     COLONOSCOPY  12/09/2018   [facility].  3 mm sessile polyp, hepatic flexure. Mild - Moderate diverticulosis, sigmoid colon.   CYSTOCELE REPAIR     ESOPHAGOGASTRODUODENOSCOPY  12/09/2018   [facility]. Normal upper GI tract   JOINT REPLACEMENT  08/2021,04/2022   RECTOCELE REPAIR     SALPINGECTOMY     SYNDACTLYLY REPAIR Right    SYNOVIAL CYST EXCISION Left    index finger   TENDON REPAIR     TOTAL ABDOMINAL HYSTERECTOMY     TOTAL HIP ARTHROPLASTY Right 08/25/2021   Procedure: RIGHT TOTAL HIP ARTHROPLASTY ANTERIOR APPROACH;  Surgeon: Jerri Kay HERO, MD;  Location: [facility] OR;  Service: Orthopedics;  Laterality: Right;  3-C   TOTAL HIP ARTHROPLASTY Left 04/06/2022   Procedure: LEFT TOTAL HIP ARTHROPLASTY ANTERIOR APPROACH;  Surgeon: Jerri Kay HERO, MD;  Location: [facility] OR;  Service: Orthopedics;  Laterality: Left;    Social History   Socioeconomic History   Marital status: Married    Spouse name: Haruka Kowaleski   Number of children: 2  Years of education: Not on file   Highest education level: Associate degree: occupational, Scientist, product/process development, or vocational program  Occupational History   Not on file  Tobacco Use   Smoking status: Never    Passive exposure: Past   Smokeless tobacco: Never  Vaping Use   Vaping status: Never Used  Substance and Sexual Activity   Alcohol use: Not Currently   Drug use: Never   Sexual activity: Yes    Partners: Male  Other Topics Concern   Not on file  Social History Narrative   Not on file   Social Drivers of Health   Financial Resource Strain: Low Risk  (04/24/2024)   Overall Financial Resource Strain (CARDIA)    Difficulty of Paying Living Expenses: Not hard at all  Food Insecurity: No Food Insecurity (04/24/2024)   Hunger Vital Sign    Worried About Running  Out of Food in the Last Year: Never true    Ran Out of Food in the Last Year: Never true  Transportation Needs: No Transportation Needs (04/24/2024)   PRAPARE - Administrator, Civil Service (Medical): No    Lack of Transportation (Non-Medical): No  Physical Activity: Sufficiently Active (04/24/2024)   Exercise Vital Sign    Days of Exercise per Week: 5 days    Minutes of Exercise per Session: 100 min  Stress: No Stress Concern Present (04/24/2024)   [employer] of Occupational Health - Occupational Stress Questionnaire    Feeling of Stress: Only a little  Social Connections: Socially Integrated (04/24/2024)   Social Connection and Isolation Panel    Frequency of Communication with Friends and Family: More than three times a week    Frequency of Social Gatherings with Friends and Family: More than three times a week    Attends Religious Services: More than 4 times per year    Active Member of Clubs or Organizations: Yes    Attends Engineer, structural: More than 4 times per year    Marital Status: Married    Family History  Problem Relation Age of Onset   Hashimoto's thyroiditis Mother    Stroke Mother    Heart attack Mother    Obesity Mother    Hypertension Father    Pancreatic cancer Father    Cancer Father    Retinal detachment Brother    Lupus Maternal Aunt    Cancer Maternal Aunt    Diabetes Maternal Grandmother    Lupus Cousin    Alcohol abuse Maternal Grandfather    Colon cancer Neg Hx    Stomach cancer Neg Hx    Esophageal cancer Neg Hx     Health Maintenance  Topic Date Due   Hepatitis C Screening  Never done   MAMMOGRAM  Never done   Pneumococcal Vaccine 45-81 Years old (1 of 2 - PCV) 12/16/2024 (Originally [birth year]10/1981)   COVID-19 Vaccine (5 - 2024-25 season) 01/01/2025 (Originally 07/04/2023)   INFLUENZA VACCINE  06/02/2024   DTaP/Tdap/Td (2 - Td or Tdap) 08/27/2030   Colonoscopy  07/05/2031   HIV Screening  Completed   Zoster Vaccines-  Shingrix  Completed   Hepatitis B Vaccines  Aged Out   HPV VACCINES  Aged Out   Meningococcal B Vaccine  Aged Out     ----------------------------------------------------------------------------------------------------------------------------------------------------------------------------------------------------------------- Physical Exam BP 111/76 (BP Location: Right Arm, Patient Position: Sitting, Cuff Size: Large)   Pulse 84   Ht 5' 5 (1.651 m)   Wt 184 lb (83.5 kg)   LMP 11/02/1993  SpO2 96%   BMI 30.62 kg/m   Physical Exam Constitutional:      Appearance: Normal appearance.  HENT:     Head: Normocephalic and atraumatic.   Eyes:     General: No scleral icterus.   Cardiovascular:     Rate and Rhythm: Normal rate and regular rhythm.  Pulmonary:     Effort: Pulmonary effort is normal.     Breath sounds: Normal breath sounds.   Musculoskeletal:     Cervical back: Neck supple.   Neurological:     Mental Status: She is alert.   Psychiatric:        Mood and Affect: Mood normal.        Behavior: Behavior normal.     ------------------------------------------------------------------------------------------------------------------------------------------------------------------------------------------------------------------- Assessment and Plan  Essential hypertension BP remains well controlled with current medications.  We'll plan to continue at current strength.    Hives She has had recurrent urticarial rash.  Some diarrhea associated with this.  Seems to worsen after eating wheat products.  Checking labs including celiac panel today.  Orders Placed This Encounter  Procedures   Celiac Disease Panel   CMP14+EGFR   CBC with Differential/Platelet   TSH   Anti-TPO Ab (RDL)   IgE   Tryptase     Hypothyroidism TSH well-controlled on recent labs.  Continue levothyroxine  at current strength.  Severe obstructive sleep apnea-hypopnea syndrome Managed by  neurology/sleep medicine  Abnormal weight gain She is doing well with Mounjaro at current strength.  She does have constipation but this is better than Wegovy .  Will plan to continue this at current strength.   No orders of the defined types were placed in this encounter.   No follow-ups on file.

## 2024-04-28 NOTE — Assessment & Plan Note (Signed)
 She is doing well with Mounjaro at current strength.  She does have constipation but this is better than Wegovy.  Will plan to continue this at current strength.

## 2024-04-28 NOTE — Assessment & Plan Note (Signed)
 TSH well-controlled on recent labs.  Continue levothyroxine at current strength.

## 2024-04-28 NOTE — Assessment & Plan Note (Signed)
 She has had recurrent urticarial rash.  Some diarrhea associated with this.  Seems to worsen after eating wheat products.  Checking labs including celiac panel today.  Orders Placed This Encounter  Procedures   Celiac Disease Panel   CMP14+EGFR   CBC with Differential/Platelet   TSH   Anti-TPO Ab (RDL)   IgE   Tryptase

## 2024-04-28 NOTE — Assessment & Plan Note (Signed)
BP remains well controlled with current medications.  We'll plan to continue at current strength.

## 2024-04-28 NOTE — Assessment & Plan Note (Signed)
Managed by neurology/sleep medicine

## 2024-05-04 ENCOUNTER — Other Ambulatory Visit: Payer: Self-pay | Admitting: Physician Assistant

## 2024-05-04 DIAGNOSIS — H9192 Unspecified hearing loss, left ear: Secondary | ICD-10-CM

## 2024-05-04 DIAGNOSIS — H6992 Unspecified Eustachian tube disorder, left ear: Secondary | ICD-10-CM

## 2024-05-06 ENCOUNTER — Emergency Department (HOSPITAL_BASED_OUTPATIENT_CLINIC_OR_DEPARTMENT_OTHER)

## 2024-05-06 ENCOUNTER — Other Ambulatory Visit: Payer: Self-pay

## 2024-05-06 ENCOUNTER — Emergency Department (HOSPITAL_BASED_OUTPATIENT_CLINIC_OR_DEPARTMENT_OTHER)
Admission: EM | Admit: 2024-05-06 | Discharge: 2024-05-07 | Disposition: A | Discharge status: Home / Self Care | Attending: Emergency Medicine | Admitting: Emergency Medicine

## 2024-05-06 ENCOUNTER — Encounter: Payer: Self-pay | Admitting: Family Medicine

## 2024-05-06 DIAGNOSIS — I10 Essential (primary) hypertension: Secondary | ICD-10-CM | POA: Insufficient documentation

## 2024-05-06 DIAGNOSIS — Z7951 Long term (current) use of inhaled steroids: Secondary | ICD-10-CM | POA: Diagnosis not present

## 2024-05-06 DIAGNOSIS — Z7982 Long term (current) use of aspirin: Secondary | ICD-10-CM | POA: Insufficient documentation

## 2024-05-06 DIAGNOSIS — S5011XA Contusion of right forearm, initial encounter: Secondary | ICD-10-CM | POA: Diagnosis not present

## 2024-05-06 DIAGNOSIS — W230XXA Caught, crushed, jammed, or pinched between moving objects, initial encounter: Secondary | ICD-10-CM | POA: Insufficient documentation

## 2024-05-06 DIAGNOSIS — S80211A Abrasion, right knee, initial encounter: Secondary | ICD-10-CM | POA: Insufficient documentation

## 2024-05-06 DIAGNOSIS — S022XXA Fracture of nasal bones, initial encounter for closed fracture: Secondary | ICD-10-CM | POA: Insufficient documentation

## 2024-05-06 DIAGNOSIS — S0083XA Contusion of other part of head, initial encounter: Secondary | ICD-10-CM | POA: Insufficient documentation

## 2024-05-06 DIAGNOSIS — M25521 Pain in right elbow: Secondary | ICD-10-CM | POA: Insufficient documentation

## 2024-05-06 DIAGNOSIS — Z8616 Personal history of COVID-19: Secondary | ICD-10-CM | POA: Diagnosis not present

## 2024-05-06 DIAGNOSIS — E039 Hypothyroidism, unspecified: Secondary | ICD-10-CM | POA: Diagnosis not present

## 2024-05-06 DIAGNOSIS — S0992XA Unspecified injury of nose, initial encounter: Secondary | ICD-10-CM | POA: Diagnosis present

## 2024-05-06 DIAGNOSIS — J45909 Unspecified asthma, uncomplicated: Secondary | ICD-10-CM | POA: Diagnosis not present

## 2024-05-06 DIAGNOSIS — W19XXXA Unspecified fall, initial encounter: Secondary | ICD-10-CM

## 2024-05-06 DIAGNOSIS — Y9302 Activity, running: Secondary | ICD-10-CM | POA: Diagnosis not present

## 2024-05-06 DIAGNOSIS — Z79899 Other long term (current) drug therapy: Secondary | ICD-10-CM | POA: Insufficient documentation

## 2024-05-06 LAB — CMP14+EGFR
ALT: 11 IU/L (ref 0–32)
AST: 14 IU/L (ref 0–40)
Albumin: 4.1 g/dL (ref 3.9–4.9)
Alkaline Phosphatase: 77 IU/L (ref 44–121)
BUN/Creatinine Ratio: 23 (ref 12–28)
BUN: 22 mg/dL (ref 8–27)
Bilirubin Total: 0.4 mg/dL (ref 0.0–1.2)
CO2: 23 mmol/L (ref 20–29)
Calcium: 9.9 mg/dL (ref 8.7–10.3)
Chloride: 102 mmol/L (ref 96–106)
Creatinine, Ser: 0.95 mg/dL (ref 0.57–1.00)
Globulin, Total: 2.5 g/dL (ref 1.5–4.5)
Glucose: 84 mg/dL (ref 70–99)
Potassium: 4.3 mmol/L (ref 3.5–5.2)
Sodium: 140 mmol/L (ref 134–144)
Total Protein: 6.6 g/dL (ref 6.0–8.5)
eGFR: 68 mL/min/1.73 (ref >59)

## 2024-05-06 LAB — CBC WITH DIFFERENTIAL/PLATELET
Basophils Absolute: 0.1 x10E3/uL (ref 0.0–0.2)
Basos: 2 %
EOS (ABSOLUTE): 0.4 x10E3/uL (ref 0.0–0.4)
Eos: 8 %
Hematocrit: 43.5 % (ref 34.0–46.6)
Hemoglobin: 13.7 g/dL (ref 11.1–15.9)
Immature Grans (Abs): 0 x10E3/uL (ref 0.0–0.1)
Immature Granulocytes: 0 %
Lymphocytes Absolute: 1.3 x10E3/uL (ref 0.7–3.1)
Lymphs: 22 %
MCH: 28.1 pg (ref 26.6–33.0)
MCHC: 31.5 g/dL (ref 31.5–35.7)
MCV: 89 fL (ref 79–97)
Monocytes Absolute: 0.4 x10E3/uL (ref 0.1–0.9)
Monocytes: 6 %
Neutrophils Absolute: 3.7 x10E3/uL (ref 1.4–7.0)
Neutrophils: 62 %
Platelets: 328 x10E3/uL (ref 150–450)
RBC: 4.87 x10E6/uL (ref 3.77–5.28)
RDW: 13.1 % (ref 11.7–15.4)
WBC: 5.9 x10E3/uL (ref 3.4–10.8)

## 2024-05-06 LAB — CELIAC DISEASE PANEL
Endomysial IgA: NEGATIVE
IgA/Immunoglobulin A, Serum: 191 mg/dL (ref 87–352)
Transglutaminase IgA: <2 U/mL (ref 0–3)

## 2024-05-06 LAB — TSH: TSH: 6.83 u[IU]/mL — ABNORMAL HIGH (ref 0.450–4.500)

## 2024-05-06 LAB — ANTI-TPO AB (RDL): Anti-TPO Ab (RDL): <9 [IU]/mL (ref <9.0)

## 2024-05-06 LAB — IGE: IgE (Immunoglobulin E), Serum: 143 [IU]/mL (ref 6–495)

## 2024-05-06 LAB — TRYPTASE: Tryptase: 5.7 ug/L (ref 2.2–13.2)

## 2024-05-06 MED ORDER — METHOCARBAMOL 500 MG PO TABS: 500.0000 mg | ORAL_TABLET | Freq: Three times a day (TID) | ORAL | 0 refills | Status: AC | PRN

## 2024-05-06 MED ORDER — ASPIRIN 81 MG PO CHEW: 81.0000 mg | CHEWABLE_TABLET | Freq: Once | ORAL | Status: DC

## 2024-05-06 MED ORDER — HYDROMORPHONE HCL 1 MG/ML IJ SOLN
0.5000 mg | Freq: Once | INTRAMUSCULAR | Status: AC
  Administered 2024-05-06: 0.5 mg via INTRAVENOUS
  Filled 2024-05-06: qty 1

## 2024-05-06 MED ORDER — ACETAMINOPHEN 325 MG PO TABS
650.0000 mg | ORAL_TABLET | Freq: Once | ORAL | Status: AC
  Administered 2024-05-06: 650 mg via ORAL
  Filled 2024-05-06: qty 2

## 2024-05-06 MED ORDER — ONDANSETRON HCL 4 MG/2ML IJ SOLN
4.0000 mg | Freq: Once | INTRAMUSCULAR | Status: AC
  Administered 2024-05-06: 4 mg via INTRAVENOUS
  Filled 2024-05-06: qty 2

## 2024-05-06 MED ORDER — OXYCODONE-ACETAMINOPHEN 5-325 MG PO TABS: 1.0000 | ORAL_TABLET | Freq: Three times a day (TID) | ORAL | 0 refills | Status: AC | PRN

## 2024-05-06 NOTE — ED Notes (Signed)
 Pt ask for Tylenol   will notify Charge Ronal as no one is currently assigned just yet, pt is roomed.

## 2024-05-06 NOTE — ED Notes (Signed)
 Fall risk bundle is currently in place.

## 2024-05-06 NOTE — ED Triage Notes (Signed)
 Pt reports a mechanical fall while running behind her grandson. Fell face forward. Presents with abrasions to forehead , nose, right hand, and right knee. No neck pain. Pt is on daily ASA.

## 2024-05-06 NOTE — ED Provider Notes (Signed)
 [place] [facility]MENT AT [facility] Provider Note   CSN: [id number] Arrival date & time: 05/06/24  2007     Patient presents with: Christy Todd is a 63 y.o. female.    Fall  Patient presents with fall.  History of factor V Leiden.  Her flip-flop caught and she fell.  Hitting her right knee right forearm and face.  Hematoma to right face.  Pain with looking up and down in the right eye.  Also states there is pain on her elbow and forearm.  No loss of consciousness.    Past Medical History:  Diagnosis Date   Allergy 1976   Arthritis    Asthma    COVID-19 07/23/2021   Depression    pt states she does not have depression. Cymbalta  is used for pain control   Factor 5 Leiden mutation, heterozygous (HCC)    GERD (gastroesophageal reflux disease)    Hypertension    Hypothyroid    Pre-diabetes    pt states she is no longer pre-diabetic due to weight loss   Seizures (HCC) 9:8:2019   Sleep apnea    on CPAP   Wears hearing aid in right ear     Prior to Admission medications   Medication Sig Start Date End Date Taking? Authorizing Provider  methocarbamol  (ROBAXIN ) 500 MG tablet Take 1 tablet (500 mg total) by mouth every 8 (eight) hours as needed. 05/06/24  Yes Patsey Lot, MD  oxyCODONE -acetaminophen  (PERCOCET/ROXICET) 5-325 MG tablet Take 1-2 tablets by mouth every 8 (eight) hours as needed for severe pain (pain score 7-10). 05/06/24  Yes Patsey Lot, MD  albuterol  (VENTOLIN  HFA) 108 (90 Base) MCG/ACT inhaler Inhale 2 puffs into the lungs every 6 (six) hours as needed for wheezing or shortness of breath. 07/23/21   Gladis Elsie BROCKS, PA-C  ASPIRIN  81 PO Take 81 mg by mouth daily.    [provider]  Cholecalciferol (VITAMIN D3) 50 MCG (2000 UT) TABS Take 2,000 Units by mouth daily.    [provider]  cyanocobalamin  (VITAMIN B12) 1000 MCG tablet Take 2,000 mcg by mouth daily.    [provider]  DULoxetine   (CYMBALTA ) 30 MG capsule TAKE 2 BY MOUTH EVERY MORNING, 1 BY MOUTH EVERY AT NIGHT 10/19/23   Alvia Bring, DO  fluticasone  (FLONASE ) 50 MCG/ACT nasal spray Place 2 sprays into both nostrils daily. 04/10/24   Breeback, Jade L, PA-C  levothyroxine  (SYNTHROID ) 125 MCG tablet TAKE 1 TABLET (125 MCG TOTAL) BY MOUTH AT BEDTIME. 03/14/24   Alvia Bring, DO  lisinopril  (ZESTRIL ) 10 MG tablet TAKE 1 TABLET BY MOUTH EVERY DAY 03/28/24   Alvia Bring, DO  Magnesium  200 MG TABS Take 200 mg by mouth daily.    [provider]  montelukast  (SINGULAIR ) 10 MG tablet TAKE 1 TABLET BY MOUTH EVERYDAY AT BEDTIME 03/14/24   Alvia Bring, DO  Moringa Oleifera (MORINGA PO) Take by mouth.    [provider]  niacin 50 MG tablet Take 50 mg by mouth at bedtime.    [provider]  NON FORMULARY 1 each by Other route at bedtime. cpap    [provider]  omeprazole (PRILOSEC) 20 MG capsule Take 20 mg by mouth at bedtime.    [provider]  ondansetron  (ZOFRAN -ODT) 8 MG disintegrating tablet Take 1 tablet (8 mg total) by mouth every 8 (eight) hours as needed for nausea or vomiting. 03/22/23   Pokhrel, Laxman, MD  pyridOXINE (VITAMIN B6) 100  MG tablet Take 100 mg by mouth daily.    [provider]  spironolactone  (ALDACTONE ) 50 MG tablet TAKE 1 TABLET BY MOUTH EVERY DAY 12/15/23   Alvia Bring, DO  tirzepatide  (ZEPBOUND ) 12.5 MG/0.5ML Pen Inject 12.5 mg into the skin once a week. 04/10/24   Alvia Bring, DO  TURMERIC CURCUMIN PO Take by mouth.    [provider]    Allergies: Ciprofloxacin, Apple juice, Gluten meal, Benzoin, Claritin [loratadine], and Septra [sulfamethoxazole-trimethoprim]    Review of Systems  Updated Vital Signs BP 129/82 (BP Location: Left Arm)   Pulse 80   Temp 98.2 F (36.8 C) (Oral)   Resp 18   LMP 11/02/1993   SpO2 99%   Physical Exam Vitals reviewed.  HENT:     Head:     Comments: Hematoma to right forehead.  Eye movements  intact.  No definite cheek tenderness.  Does have some pain on the jaw.  Without deformity.  Tongue does protrude somewhat to the left. Cardiovascular:     Rate and Rhythm: Normal rate.  Pulmonary:     Breath sounds: No wheezing.  Abdominal:     Tenderness: There is no abdominal tenderness.  Musculoskeletal:        General: Tenderness present.     Cervical back: Neck supple. No tenderness.     Comments: Tenderness to right elbow with hematoma.  Also proximal right forearm tenderness.  Neurovascular intact in hand.  Abrasion to right knee.  May have small laceration.  Tenderness inferiorly.  Neurological:     Mental Status: She is alert.     (all labs ordered are listed, but only abnormal results are displayed) Labs Reviewed - No data to display  EKG: None  Radiology: DG Forearm Right Result Date: 05/06/2024 CLINICAL DATA:  fall EXAM: RIGHT FOREARM - 2 VIEW COMPARISON:  None Available. FINDINGS: There is no evidence of fracture or other focal bone lesions. First digit carpometacarpal joint degenerative changes. Soft tissues are unremarkable. IMPRESSION: No acute displaced fracture or dislocation. Electronically Signed   By: Morgane  Naveau M.D.   On: 05/06/2024 23:05   DG Elbow Complete Right Result Date: 05/06/2024 CLINICAL DATA:  fall EXAM: RIGHT ELBOW - COMPLETE 3+ VIEW COMPARISON:  None Available. FINDINGS: There is no evidence of fracture, dislocation, or joint effusion. There is no evidence of arthropathy or other focal bone abnormality. Soft tissues are unremarkable. IMPRESSION: Negative. Electronically Signed   By: Morgane  Naveau M.D.   On: 05/06/2024 23:04   DG Knee Complete 4 Views Right Result Date: 05/06/2024 CLINICAL DATA:  fall EXAM: RIGHT KNEE - COMPLETE 4+ VIEW COMPARISON:  None Available. FINDINGS: No evidence of fracture, dislocation, or joint effusion. Moderate tricompartmental degenerative changes of the knee. Soft tissues are unremarkable. IMPRESSION: No acute  displaced fracture or dislocation. Electronically Signed   By: Morgane  Naveau M.D.   On: 05/06/2024 23:04   CT Head Wo Contrast Result Date: 05/06/2024 CLINICAL DATA:  Head trauma, moderate-severe; Facial trauma, blunt; Neck trauma, midline tenderness (Age 40-64y) EXAM: CT HEAD WITHOUT CONTRAST CT MAXILLOFACIAL WITHOUT CONTRAST CT CERVICAL SPINE WITHOUT CONTRAST TECHNIQUE: Multidetector CT imaging of the head, cervical spine, and maxillofacial structures were performed using the standard protocol without intravenous contrast. Multiplanar CT image reconstructions of the cervical spine and maxillofacial structures were also generated. RADIATION DOSE REDUCTION: This exam was performed according to the departmental dose-optimization program which includes automated exposure control, adjustment of the mA and/or kV according to patient size and/or use  of iterative reconstruction technique. COMPARISON:  None Available. FINDINGS: CT HEAD FINDINGS Brain: No evidence of large-territorial acute infarction. No parenchymal hemorrhage. No mass lesion. No extra-axial collection. No mass effect or midline shift. No hydrocephalus. Basilar cisterns are patent. Vascular: No hyperdense vessel. Skull: No acute fracture or focal lesion. Other: 3 mm right frontal scalp hematoma. CT MAXILLOFACIAL FINDINGS Osseous: Acute minimally displaced right nasal bone fracture (4:52). No destructive process. Sinuses/Orbits: Paranasal sinuses and mastoid air cells are clear. The orbits are unremarkable. Soft tissues: Right periorbital hematoma. CT CERVICAL SPINE FINDINGS Alignment: Reversal of the normal cervical lordosis centered at the C5-C6 level likely due to degenerative changes and positioning. Skull base and vertebrae: Mild-to-moderate degenerative changes of the spine most prominent at the C5-C6 level. No acute fracture. No aggressive appearing focal osseous lesion or focal pathologic process. Soft tissues and spinal canal: No prevertebral  fluid or swelling. No visible canal hematoma. Upper chest: Unremarkable. Other: None. IMPRESSION: 1. No acute intracranial abnormality. 2. Acute minimally displaced right nasal bone fracture 3. No acute displaced fracture or traumatic listhesis of the cervical spine. Electronically Signed   By: Morgane  Naveau M.D.   On: 05/06/2024 23:03   CT Maxillofacial Wo Contrast Result Date: 05/06/2024 CLINICAL DATA:  Head trauma, moderate-severe; Facial trauma, blunt; Neck trauma, midline tenderness (Age 50-64y) EXAM: CT HEAD WITHOUT CONTRAST CT MAXILLOFACIAL WITHOUT CONTRAST CT CERVICAL SPINE WITHOUT CONTRAST TECHNIQUE: Multidetector CT imaging of the head, cervical spine, and maxillofacial structures were performed using the standard protocol without intravenous contrast. Multiplanar CT image reconstructions of the cervical spine and maxillofacial structures were also generated. RADIATION DOSE REDUCTION: This exam was performed according to the departmental dose-optimization program which includes automated exposure control, adjustment of the mA and/or kV according to patient size and/or use of iterative reconstruction technique. COMPARISON:  None Available. FINDINGS: CT HEAD FINDINGS Brain: No evidence of large-territorial acute infarction. No parenchymal hemorrhage. No mass lesion. No extra-axial collection. No mass effect or midline shift. No hydrocephalus. Basilar cisterns are patent. Vascular: No hyperdense vessel. Skull: No acute fracture or focal lesion. Other: 3 mm right frontal scalp hematoma. CT MAXILLOFACIAL FINDINGS Osseous: Acute minimally displaced right nasal bone fracture (4:52). No destructive process. Sinuses/Orbits: Paranasal sinuses and mastoid air cells are clear. The orbits are unremarkable. Soft tissues: Right periorbital hematoma. CT CERVICAL SPINE FINDINGS Alignment: Reversal of the normal cervical lordosis centered at the C5-C6 level likely due to degenerative changes and positioning. Skull base  and vertebrae: Mild-to-moderate degenerative changes of the spine most prominent at the C5-C6 level. No acute fracture. No aggressive appearing focal osseous lesion or focal pathologic process. Soft tissues and spinal canal: No prevertebral fluid or swelling. No visible canal hematoma. Upper chest: Unremarkable. Other: None. IMPRESSION: 1. No acute intracranial abnormality. 2. Acute minimally displaced right nasal bone fracture 3. No acute displaced fracture or traumatic listhesis of the cervical spine. Electronically Signed   By: Morgane  Naveau M.D.   On: 05/06/2024 23:03   CT Cervical Spine Wo Contrast Result Date: 05/06/2024 CLINICAL DATA:  Head trauma, moderate-severe; Facial trauma, blunt; Neck trauma, midline tenderness (Age 20-64y) EXAM: CT HEAD WITHOUT CONTRAST CT MAXILLOFACIAL WITHOUT CONTRAST CT CERVICAL SPINE WITHOUT CONTRAST TECHNIQUE: Multidetector CT imaging of the head, cervical spine, and maxillofacial structures were performed using the standard protocol without intravenous contrast. Multiplanar CT image reconstructions of the cervical spine and maxillofacial structures were also generated. RADIATION DOSE REDUCTION: This exam was performed according to the departmental dose-optimization program which includes automated exposure  control, adjustment of the mA and/or kV according to patient size and/or use of iterative reconstruction technique. COMPARISON:  None Available. FINDINGS: CT HEAD FINDINGS Brain: No evidence of large-territorial acute infarction. No parenchymal hemorrhage. No mass lesion. No extra-axial collection. No mass effect or midline shift. No hydrocephalus. Basilar cisterns are patent. Vascular: No hyperdense vessel. Skull: No acute fracture or focal lesion. Other: 3 mm right frontal scalp hematoma. CT MAXILLOFACIAL FINDINGS Osseous: Acute minimally displaced right nasal bone fracture (4:52). No destructive process. Sinuses/Orbits: Paranasal sinuses and mastoid air cells are clear.  The orbits are unremarkable. Soft tissues: Right periorbital hematoma. CT CERVICAL SPINE FINDINGS Alignment: Reversal of the normal cervical lordosis centered at the C5-C6 level likely due to degenerative changes and positioning. Skull base and vertebrae: Mild-to-moderate degenerative changes of the spine most prominent at the C5-C6 level. No acute fracture. No aggressive appearing focal osseous lesion or focal pathologic process. Soft tissues and spinal canal: No prevertebral fluid or swelling. No visible canal hematoma. Upper chest: Unremarkable. Other: None. IMPRESSION: 1. No acute intracranial abnormality. 2. Acute minimally displaced right nasal bone fracture 3. No acute displaced fracture or traumatic listhesis of the cervical spine. Electronically Signed   By: Morgane  Naveau M.D.   On: 05/06/2024 23:03     Procedures   Medications Ordered in the ED  acetaminophen  (TYLENOL ) tablet 650 mg (650 mg Oral Given 05/06/24 2053)  HYDROmorphone  (DILAUDID ) injection 0.5 mg (0.5 mg Intravenous Given 05/06/24 2313)  ondansetron  (ZOFRAN ) injection 4 mg (4 mg Intravenous Given 05/06/24 2313)                                    Medical Decision Making Amount and/or Complexity of Data Reviewed Radiology: ordered.  Risk OTC drugs. Prescription drug management.   Patient with mechanical fall.  Forehead injury.  Jaw pain.  Some pain with eye movement.  Pain in right elbow and forearm.  Pain right knee.  Will get imaging.  Differential diagnose includes intracranial hemorrhage, fractures.  Will get CT and x-ray imaging.  CT scan done and does show nasal fracture otherwise no fracture seen.  Does have negative x-ray also of elbow forearm and knee.  Will need wound care on the knee.  Will give sling for comfort of the right arm.  Will give symptomatic treatment including muscle aches and pain meds.  Follow-up with PCP as needed.      Final diagnoses:  Fall, initial encounter  Facial hematoma, initial  encounter  Closed fracture of nasal bone, initial encounter  Contusion of right forearm, initial encounter    ED Discharge Orders          Ordered    oxyCODONE -acetaminophen  (PERCOCET/ROXICET) 5-325 MG tablet  Every 8 hours PRN        05/06/24 2325    methocarbamol  (ROBAXIN ) 500 MG tablet  Every 8 hours PRN        05/06/24 2325               Patsey Lot, MD 05/06/24 2327

## 2024-05-07 ENCOUNTER — Ambulatory Visit: Payer: Self-pay | Admitting: Family Medicine

## 2024-05-07 DIAGNOSIS — E063 Autoimmune thyroiditis: Secondary | ICD-10-CM

## 2024-05-07 MED ORDER — OXYCODONE-ACETAMINOPHEN 5-325 MG PO TABS
1.0000 | ORAL_TABLET | Freq: Once | ORAL | Status: AC
  Administered 2024-05-07: 1 via ORAL
  Filled 2024-05-07: qty 1

## 2024-05-07 MED ORDER — METHOCARBAMOL 500 MG PO TABS
500.0000 mg | ORAL_TABLET | Freq: Once | ORAL | Status: AC
  Administered 2024-05-07: 500 mg via ORAL
  Filled 2024-05-07: qty 1

## 2024-05-07 MED ORDER — LEVOTHYROXINE SODIUM 137 MCG PO TABS: 125.0000 ug | ORAL_TABLET | Freq: Every evening | ORAL | 0 refills | Status: DC

## 2024-05-12 ENCOUNTER — Ambulatory Visit: Payer: Self-pay

## 2024-05-12 ENCOUNTER — Ambulatory Visit (INDEPENDENT_AMBULATORY_CARE_PROVIDER_SITE_OTHER): Admitting: Medical-Surgical

## 2024-05-12 ENCOUNTER — Encounter: Payer: Self-pay | Admitting: Medical-Surgical

## 2024-05-12 VITALS — BP 143/88 | HR 76 | Ht 65.0 in | Wt 187.0 lb

## 2024-05-12 DIAGNOSIS — Z09 Encounter for follow-up examination after completed treatment for conditions other than malignant neoplasm: Secondary | ICD-10-CM | POA: Diagnosis not present

## 2024-05-12 DIAGNOSIS — K5903 Drug induced constipation: Secondary | ICD-10-CM | POA: Diagnosis not present

## 2024-05-12 NOTE — Telephone Encounter (Signed)
 FYI Only or Action Required?: FYI only for provider.  Patient was last seen in primary care on 04/28/2024 by Alvia Bring, DO.  Called Nurse Triage reporting No chief complaint on file..  Symptoms began a week ago.  Interventions attempted: OTC medications: Glycerin Suppository and Rest, hydration, or home remedies.  Symptoms are: gradually worsening.  Triage Disposition: See Physician Within 24 Hours  Patient/caregiver understands and will follow disposition?: Yes Reason for Disposition  Last bowel movement (BM) > 4 days ago  Answer Assessment - Initial Assessment Questions 1. STOOL PATTERN OR FREQUENCY: How often do you have a bowel movement (BM)?  (Normal range: 3 times a day to every 3 days)  When was your last BM?       Every other day, at the most 2 days max.   2. STRAINING: Do you have to strain to have a BM?      No  3. ONSET: When did the constipation begin?     A week ago  4. RECTAL PAIN: Does your rectum hurt when the stool comes out? If Yes, ask: Do you have hemorrhoids? How bad is the pain?  (Scale 1-10; or mild, moderate, severe)     No  5. BM COMPOSITION: Are the stools hard?      No, passing Mucus  6. BLOOD ON STOOLS: Has there been any blood on the toilet tissue or on the surface of the BM? If Yes, ask: When was the last time?     Streaks or flecks of blood  7. CHRONIC CONSTIPATION: Is this a new problem for you?  If No, ask: How long have you had this problem? (days, weeks, months)     No, due to prescription  8. CHANGES IN DIET OR HYDRATION: Have there been any recent changes in your diet? How much fluids are you drinking on a daily basis?  How much have you had to drink today?     Drinking plenty of fluids  9. MEDICINES: Have you been taking any new medicines? Are you taking any narcotic pain medicines? (e.g., Dilaudid , morphine , Percocet, Vicodin)     In the ER, given multiple prescriptions for pain for a previous fall.  Only have been off of them for three days post fall and post issue onset.   10. LAXATIVES: Have you been using any stool softeners, laxatives, or enemas?  If Yes, ask What are you using, how often, and when was the last time?       Glycerin Suppository  11. ACTIVITY:  How much walking do you do every day?  Has your activity level decreased in the past week?        Activity  level has decreased  12. CAUSE: What do you think is causing the constipation?        Unsure  13. MEDICAL HISTORY: Do you have a history of hemorrhoids, rectal fissures, rectal surgery, or rectal abscess?         Hemorrhoids  14. OTHER SYMPTOMS: Do you have any other symptoms? (e.g., abdomen pain, bloating, fever, vomiting)       Lower Abdominal Pain  15. PREGNANCY: Is there any chance you are pregnant? When was your last menstrual period?       No and No  Protocols used: Constipation-A-AH

## 2024-05-12 NOTE — Telephone Encounter (Signed)
 Copied from CRM [id number]. Topic: Clinical - Pink Word Triage >> May 12, 2024  1:10 PM Suzette B wrote: Reason for Triage: *** >> May 12, 2024  1:12 PM Farrel B wrote: Patient has called [phone], she states that she has not had a bowel movement in a full week and fear going into diverticulitis she is needing to speak someone to see what her best options are.

## 2024-05-12 NOTE — Patient Instructions (Signed)
  VISIT SUMMARY: Today, you were seen for constipation and abdominal pain. You also discussed your recent fall, which resulted in a broken nose and a lump on your forehead. Additionally, you have a scabbed abrasion on your right knee. We reviewed your symptoms and provided a treatment plan to help alleviate your discomfort.  YOUR PLAN: -CONSTIPATION: Constipation is when you have difficulty passing stools or have infrequent bowel movements. To help relieve your constipation, you were given Dulcolax 10 mg orally and prescribed MiraLAX , one capful in at least 8 ounces of fluid, twice daily for three days. It's important to stay hydrated. Be aware that you might experience abdominal cramping and diarrhea as side effects. Please contact the clinic if your symptoms persist.  -NASAL BONE FRACTURE: A nasal bone fracture is a break in the bone of your nose. This occurred from your fall, which also caused a lump on your forehead and soreness in your ribs. No other fractures were identified.  -KNEE ABRASION: A knee abrasion is a scrape or scratch on the skin of your knee. You have a scabbed abrasion on your right knee with significant skin loss.  INSTRUCTIONS: Please follow up with Dr. Alvia on Monday if your constipation symptoms persist.                      Contains text generated by [employer]bridge.                                 Contains text generated by Abridge.

## 2024-05-12 NOTE — Progress Notes (Signed)
        Established patient visit  Discussed the use of AI scribe software for clinical note transcription with the patient, who gave verbal consent to proceed.  History of Present Illness   Christy Todd is a 63 year old female with a history of diverticulitis who presents with constipation and abdominal pain.  Constipation and altered bowel habits - Constipation since July 4th - Last bowel movement on July 4th - Bowel movements are liquid and mucousy with a tint of color - No use of medication for constipation - Streaky blood in mucus, attributed to straining and hemorrhoids - No black tarry stools  Abdominal pain and gastrointestinal symptoms - Lower abdominal pain, intensity varies from 4 to 7 out of 10 - Nausea without vomiting - Sensation of bloating and heaviness, particularly in the back  History of diverticulitis - History of diverticulitis - Does not believe current pain indicates a recurrence  Recent trauma and musculoskeletal symptoms - [place] while jogging on July 5th - Sustained a broken nose and a lump on the forehead - Spasms in right forearm following the fall - Took pain medication and muscle relaxers for two days after the fall, believed to have contributed to constipation      Physical Exam Vitals reviewed.  Constitutional:      General: She is not in acute distress.    Appearance: Normal appearance. She is not ill-appearing.  HENT:     Head: Normocephalic and atraumatic.  Cardiovascular:     Rate and Rhythm: Normal rate and regular rhythm.     Pulses: Normal pulses.     Heart sounds: Normal heart sounds. No murmur heard.    No friction rub. No gallop.  Pulmonary:     Effort: Pulmonary effort is normal. No respiratory distress.     Breath sounds: Normal breath sounds. No wheezing.  Skin:    General: Skin is warm and dry.  Neurological:     Mental Status: She is alert and oriented to person, place, and time.  Psychiatric:        Mood and Affect:  Mood normal.        Behavior: Behavior normal.        Thought Content: Thought content normal.        Judgment: Judgment normal.     Assessment and Plan    Constipation Constipation with abdominal pain, bloating, and mucousy stool. Hemorrhoids suspected as source of blood streaks. Partial obstruction unlikely. - Administer Dulcolax 10 mg orally. - Prescribe MiraLAX , one capful in at least 8 ounces of fluid, twice daily for three days. - Advise maintaining hydration. - Discussed potential side effects: abdominal cramping and diarrhea. - Instructed to contact clinic if symptoms persist.  Nasal Bone Fracture Nasal bone fracture from fall with forehead lump and rib soreness. No other fractures identified.  Knee Abrasion Scabbed abrasion on right knee with significant skin loss.  Follow-up Advised to follow up if constipation symptoms persist. Dr. Alvia available Monday for further consultation if needed. - Follow-up with Dr. Alvia if symptoms persist.     Return if symptoms worsen or fail to improve.  __________________________________ Christy FREDRIK Palin, DNP, APRN, FNP-BC Primary Care and Sports Medicine [facility] [place]

## 2024-05-14 ENCOUNTER — Encounter: Payer: Self-pay | Admitting: Family Medicine

## 2024-05-18 ENCOUNTER — Encounter: Payer: Self-pay | Admitting: Family Medicine

## 2024-05-18 MED ORDER — CEPHALEXIN 500 MG PO CAPS: 500.0000 mg | ORAL_CAPSULE | Freq: Four times a day (QID) | ORAL | 0 refills | Status: AC

## 2024-06-17 ENCOUNTER — Other Ambulatory Visit: Payer: Self-pay | Admitting: Family Medicine

## 2024-06-30 NOTE — Progress Notes (Signed)
 Office Visit Note  Patient: Christy Todd             Date of Birth: [birth year]           MRN: [id number]             PCP: Alvia Bring, DO Referring: Alvia Bring, DO Visit Date: 07/14/2024 Occupation: @GUAROCC @  Subjective:  Joint stiffness  History of Present Illness: ZIA NAJERA is a 63 y.o. female with osteoarthritis, sicca symptoms and positive ANA.  She returns today after her last visit in March 2025.  She states in April 2025 she developed hives all over her body after eating some food.  The rash resolved with no recurrence.  She continues to have dry mouth and dry eyes which is manageable.  She has been using over-the-counter products.  She denies any history of oral ulcers, nasal ulcers.  She states sicca symptoms are mild and not bothersome.  She continues to have joint pain and stiffness.  She has a right finger splint which has been helpful.  There is no history of Raynaud's phenomenon, lymphadenopathy or inflammatory arthritis.    Activities of Daily Living:  Patient reports morning stiffness for all day..   Patient Reports nocturnal pain.  Difficulty dressing/grooming: Denies Difficulty climbing stairs: Denies Difficulty getting out of chair: Denies Difficulty using hands for taps, buttons, cutlery, and/or writing: Denies  Review of Systems  Constitutional:  Positive for fatigue.  HENT:  Negative for mouth sores and mouth dryness.   Eyes:  Negative for dryness.  Respiratory:  Negative for shortness of breath.   Cardiovascular:  Negative for chest pain and palpitations.  Gastrointestinal:  Positive for constipation. Negative for blood in stool and diarrhea.  Endocrine: Negative for increased urination.  Genitourinary:  Negative for involuntary urination.  Musculoskeletal:  Positive for joint pain, joint pain and morning stiffness. Negative for gait problem, joint swelling, myalgias, muscle weakness, muscle tenderness and myalgias.  Skin:  Positive for  sensitivity to sunlight. Negative for color change, rash and hair loss.  Allergic/Immunologic: Positive for susceptible to infections.  Neurological:  Negative for dizziness and headaches.  Hematological:  Negative for swollen glands.  Psychiatric/Behavioral:  Positive for sleep disturbance. Negative for depressed mood. The patient is not nervous/anxious.     PMFS History:  Patient Active Problem List   Diagnosis Date Noted   Dysfunction of left eustachian tube 04/10/2024   Change in hearing, left 04/10/2024   Allergic reaction 04/10/2024   Hives 04/10/2024   Arthralgia 09/20/2023   Word finding difficulty 08/06/2023   Hypomagnesemia 03/31/2023   Diverticulitis 03/19/2023   Constipation 03/19/2023   Abnormal weight gain 01/23/2023   Dyspnea 09/04/2022   Chest pain 07/06/2022   Status post total replacement of left hip 04/06/2022   Clavicle pain 02/13/2022   Primary osteoarthritis of left hip 11/21/2021   Orthostatic hypotension 09/22/2021   Dizziness 09/22/2021   Drug-induced weight loss 09/22/2021   Status post total replacement of right hip 08/25/2021   Sleep behavior disorder, 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 Cymbalta  is used for pain control   Factor 5 Leiden mutation, heterozygous (HCC)    GERD (gastroesophageal reflux disease)    Hypertension    Hypothyroid    Pre-diabetes    pt states she  is no longer pre-diabetic due to weight loss   Seizures (HCC) 9:8:2019   Sleep apnea    on CPAP   Wears hearing aid in right ear     Family History  Problem Relation Age of Onset   Hashimoto's thyroiditis Mother    Stroke Mother    Heart attack Mother    Obesity Mother    Hypertension Father    Pancreatic cancer Father    Cancer Father    Retinal detachment Brother    Lupus Maternal Aunt    Cancer Maternal Aunt    Diabetes Maternal Grandmother    Lupus Cousin    Alcohol abuse Maternal Grandfather    Colon cancer Neg Hx    Stomach cancer Neg Hx    Esophageal cancer Neg Hx    Past Surgical History:  Procedure Laterality Date   ABDOMINAL HYSTERECTOMY     ANKLE RECONSTRUCTION Left    CHOLECYSTECTOMY     COLONOSCOPY  12/09/2018   [facility].  3 mm sessile polyp, hepatic flexure. Mild - Moderate diverticulosis, sigmoid colon.   CYSTOCELE REPAIR     ESOPHAGOGASTRODUODENOSCOPY  12/09/2018   [facility]. Normal upper GI tract   JOINT REPLACEMENT  08/2021,04/2022   RECTOCELE REPAIR     SALPINGECTOMY     SYNDACTLYLY REPAIR Right    SYNOVIAL CYST EXCISION Left    index finger   TENDON REPAIR     TOTAL ABDOMINAL HYSTERECTOMY     TOTAL HIP ARTHROPLASTY Right 08/25/2021   Procedure: RIGHT TOTAL HIP ARTHROPLASTY ANTERIOR APPROACH;  Surgeon: Jerri Kay HERO, MD;  Location: [facility] OR;  Service: Orthopedics;  Laterality: Right;  3-C   TOTAL HIP ARTHROPLASTY Left 04/06/2022   Procedure: LEFT TOTAL HIP ARTHROPLASTY ANTERIOR APPROACH;  Surgeon: Jerri Kay HERO, MD;  Location: [facility] OR;  Service: Orthopedics;  Laterality: Left;   Social History   Social History Narrative   Not on file   Immunization History  Administered Date(s) Administered   Influenza, Seasonal, Injecte, Preservative Fre 08/06/2023   Influenza,inj,Quad PF,6+ Mos 10/03/2021, 09/04/2022   Influenza-Unspecified 08/30/2019, 08/20/2020   PFIZER(Purple  Top)SARS-COV-2 Vaccination 12/22/2019, 01/15/2020, 08/20/2020, 10/20/2021   Tdap 08/27/2020   Zoster Recombinant(Shingrix) 05/23/2021, 11/07/2021     Objective: Vital Signs: BP 116/74   Pulse 68   Temp 97.8 F (36.6 C)   Resp 15   Ht 5' 6 (1.676 m)   Wt 183 lb 12.8 oz (83.4 kg)   LMP 11/02/1993   BMI 29.67 kg/m    Physical Exam Vitals and nursing note reviewed.  Constitutional:      Appearance: She is well-developed.  HENT:     Head: Normocephalic and atraumatic.  Eyes:     Conjunctiva/sclera: Conjunctivae normal.  Cardiovascular:     Rate and Rhythm: Normal rate and regular rhythm.     Heart sounds: Normal heart sounds.  Pulmonary:     Effort: Pulmonary effort is normal.     Breath sounds: Normal breath sounds.  Abdominal:     General: Bowel  sounds are normal.     Palpations: Abdomen is soft.  Musculoskeletal:     Cervical back: Normal range of motion.  Lymphadenopathy:     Cervical: No cervical adenopathy.  Skin:    General: Skin is warm and dry.     Capillary Refill: Capillary refill takes less than 2 seconds.  Neurological:     Mental Status: She is alert and oriented to person, place, and time.  Psychiatric:        Behavior: Behavior normal.      Musculoskeletal Exam:Cervical, thoracic and lumbar spine were in good range of motion.  There was no SI joint tenderness.  She had prominence of right clavicle without any tenderness.  Shoulder joints, elbow joints, wrist joints, MCPs, PIPs and DIPs were in good range of motion with no synovitis.  She had bilateral PIP and DIP thickening with subluxation of right index finger DIP joint.  Hip joints and knee joints were in good range of motion without any warmth swelling or effusion.  Bilateral hip joints were replaced and good range of motion.  There was no tenderness over ankles or MTPs.   CDAI Exam: CDAI Score: -- Patient Global: --; Provider Global: -- Swollen: --; Tender: -- Joint Exam 07/14/2024   No joint  exam has been documented for this visit   There is currently no information documented on the homunculus. Go to the Rheumatology activity and complete the homunculus joint exam.  Investigation: No additional findings.  Imaging: No results found.  Recent Labs: Lab Results  Component Value Date   WBC 5.9 04/28/2024   HGB 13.7 04/28/2024   PLT 328 04/28/2024   NA 140 04/28/2024   K 4.3 04/28/2024   CL 102 04/28/2024   CO2 23 04/28/2024   GLUCOSE 84 04/28/2024   BUN 22 04/28/2024   CREATININE 0.95 04/28/2024   BILITOT 0.4 04/28/2024   ALKPHOS 77 04/28/2024   AST 14 04/28/2024   ALT 11 04/28/2024   PROT 6.6 04/28/2024   ALBUMIN 4.1 04/28/2024   CALCIUM 9.9 04/28/2024    Speciality Comments: No specialty comments available.  Procedures:  No procedures performed Allergies: Ciprofloxacin, Apple juice, Gluten meal, Benzoin, Claritin [loratadine], and Septra [sulfamethoxazole-trimethoprim]   Assessment / Plan:     Visit Diagnoses: Sicca syndrome (HCC) - Positive ANA, ENA negative, dry mouth and dry eyes: Her ANA is low titer.  She states sicca symptoms are mild and manageable.  She declined use of pilocarpine in the past.  Positive ANA-ENA panel was negative in the past.  I will recheck labs in 1 year prior to her next visit.  Patient was advised to contact us  if she develops any new symptoms.  Primary osteoarthritis of both hands -she bilateral PIP and DIP thickening with subluxation of some of the DIP joints.  She is using a finger splint for her right index finger.  Clinical and radiographic findings suggestive of osteoarthritis.  Joint protection muscle strengthening was discussed.  Clavicle pain - MRI 2023 was unremarkable.  She has intermittent discomfort in her right clavicle.  She plans to see Dr. Jerri.  Status post total replacement of both hips - RTHR 08/25/21, LTHR 04/06/22 by Dr. Jerri.  Doing well.  Chronic pain of left ankle -she denies discomfort today.  Patient had  surgery by Dr. Silva in the past-partial resection of fifth metatarsal and hallux valgus deformity correction.  Primary osteoarthritis of both feet -proper fitting shoes were advised.  She is under care of Dr.  McDonald  DDD (degenerative disc disease), cervical - X-rays obtained in the past showed multilevel spondylosis and facet joint arthropathy.  She had good range of motion.  Patient notices some crepitus but no discomfort.  Arthropathy of lumbar facet joint - Noted on the x-rays.  She had no discomfort on the examination today.  Other fatigue-she continues to have some fatigue.  Essential hypertension-blood pressure was normal today.  Orthostatic hypotension  Diverticulitis  Hypothyroidism due to Hashimoto thyroiditis  Mild persistent asthma without complication  OSA on CPAP  Sleep behavior disorder, REM  Seizure disorder (HCC)  Family history of systemic lupus erythematosus-maternal aunt and niece  Orders: Orders Placed This Encounter  Procedures   CBC with Differential/Platelet   Comprehensive metabolic panel with GFR   Protein / creatinine ratio, urine   Anti-DNA antibody, double-stranded   C3 and C4   ANA   Sedimentation rate   No orders of the defined types were placed in this encounter.    Follow-Up Instructions: Return in about 1 year (around 07/14/2025) for Osteoarthritis, sicca,+ANA.   Maya Nash, MD  Note - This record has been created using Animal nutritionist.  Chart creation errors have been sought, but may not always  have been located. Such creation errors do not reflect on  the standard of medical care.

## 2024-07-14 ENCOUNTER — Encounter: Payer: Self-pay | Admitting: Rheumatology

## 2024-07-14 ENCOUNTER — Ambulatory Visit: Attending: Rheumatology | Admitting: Rheumatology

## 2024-07-14 VITALS — BP 116/74 | HR 68 | Temp 97.8°F | Resp 15 | Ht 66.0 in | Wt 183.8 lb

## 2024-07-14 DIAGNOSIS — G4752 REM sleep behavior disorder: Secondary | ICD-10-CM

## 2024-07-14 DIAGNOSIS — L509 Urticaria, unspecified: Secondary | ICD-10-CM

## 2024-07-14 DIAGNOSIS — M3500 Sicca syndrome, unspecified: Secondary | ICD-10-CM | POA: Diagnosis not present

## 2024-07-14 DIAGNOSIS — Z96643 Presence of artificial hip joint, bilateral: Secondary | ICD-10-CM | POA: Diagnosis not present

## 2024-07-14 DIAGNOSIS — M47816 Spondylosis without myelopathy or radiculopathy, lumbar region: Secondary | ICD-10-CM

## 2024-07-14 DIAGNOSIS — M19041 Primary osteoarthritis, right hand: Secondary | ICD-10-CM

## 2024-07-14 DIAGNOSIS — M5030 Other cervical disc degeneration, unspecified cervical region: Secondary | ICD-10-CM

## 2024-07-14 DIAGNOSIS — M19071 Primary osteoarthritis, right ankle and foot: Secondary | ICD-10-CM

## 2024-07-14 DIAGNOSIS — M19042 Primary osteoarthritis, left hand: Secondary | ICD-10-CM

## 2024-07-14 DIAGNOSIS — M19072 Primary osteoarthritis, left ankle and foot: Secondary | ICD-10-CM

## 2024-07-14 DIAGNOSIS — M25572 Pain in left ankle and joints of left foot: Secondary | ICD-10-CM

## 2024-07-14 DIAGNOSIS — I951 Orthostatic hypotension: Secondary | ICD-10-CM

## 2024-07-14 DIAGNOSIS — J4530 Mild persistent asthma, uncomplicated: Secondary | ICD-10-CM

## 2024-07-14 DIAGNOSIS — Z8269 Family history of other diseases of the musculoskeletal system and connective tissue: Secondary | ICD-10-CM

## 2024-07-14 DIAGNOSIS — G40909 Epilepsy, unspecified, not intractable, without status epilepticus: Secondary | ICD-10-CM

## 2024-07-14 DIAGNOSIS — M898X1 Other specified disorders of bone, shoulder: Secondary | ICD-10-CM | POA: Diagnosis not present

## 2024-07-14 DIAGNOSIS — G4733 Obstructive sleep apnea (adult) (pediatric): Secondary | ICD-10-CM

## 2024-07-14 DIAGNOSIS — R768 Other specified abnormal immunological findings in serum: Secondary | ICD-10-CM

## 2024-07-14 DIAGNOSIS — K5792 Diverticulitis of intestine, part unspecified, without perforation or abscess without bleeding: Secondary | ICD-10-CM

## 2024-07-14 DIAGNOSIS — I10 Essential (primary) hypertension: Secondary | ICD-10-CM

## 2024-07-14 DIAGNOSIS — G8929 Other chronic pain: Secondary | ICD-10-CM

## 2024-07-14 DIAGNOSIS — R5383 Other fatigue: Secondary | ICD-10-CM

## 2024-07-14 DIAGNOSIS — E063 Autoimmune thyroiditis: Secondary | ICD-10-CM

## 2024-07-14 NOTE — Patient Instructions (Signed)
 Standing Labs We placed an order today for your standing lab work.   Please have your standing labs drawn in  September 2026  Please have your labs drawn 2 weeks prior to your appointment so that the provider can discuss your lab results at your appointment, if possible.  Please note that you may see your imaging and lab results in MyChart before we have reviewed them. We will contact you once all results are reviewed. Please allow our office up to 72 hours to thoroughly review all of the results before contacting the office for clarification of your results.  WALK-IN LAB HOURS  Monday through Thursday from 8:00 am -12:30 pm and 1:00 pm-4:30 pm and Friday from 8:00 am-12:00 pm.  Patients with office visits requiring labs will be seen before walk-in labs.  You may encounter longer than normal wait times. Please allow additional time. Wait times may be shorter on  Monday and Thursday afternoons.  We do not book appointments for walk-in labs. We appreciate your patience and understanding with our staff.   Labs are drawn by [facility]. Please bring your co-pay at the time of your lab draw.  You may receive a bill from [facility] for your lab work.  Please note if you are on Hydroxychloroquine and and an order has been placed for a Hydroxychloroquine level,  you will need to have it drawn 4 hours or more after your last dose.  If you wish to have your labs drawn at another location, please call the office 24 hours in advance so we can fax the orders.  The office is located at [address], [place], [place] [address]   If you have any questions regarding directions or hours of operation,  please call [phone].   As a reminder, please drink plenty of water  prior to coming for your lab work. Thanks!

## 2024-07-19 ENCOUNTER — Other Ambulatory Visit: Payer: Self-pay | Admitting: Family Medicine

## 2024-08-02 ENCOUNTER — Encounter: Payer: Self-pay | Admitting: Family Medicine

## 2024-08-03 ENCOUNTER — Other Ambulatory Visit (HOSPITAL_COMMUNITY): Payer: Self-pay

## 2024-08-03 ENCOUNTER — Telehealth: Payer: Self-pay

## 2024-08-03 NOTE — Telephone Encounter (Signed)
 Clinical questions answered and PA submitted.

## 2024-08-03 NOTE — Telephone Encounter (Signed)
 Pharmacy Patient Advocate Encounter   Received notification from Patient Advice Request messages that prior authorization for Zepbound  12.5mg /0.24ml is required/requested.   Insurance verification completed.   The patient is insured through [facility].   Per test claim: PA required; PA started via CoverMyMeds. KEY [id number] . Please see clinical question(s) below that I am not finding the answer to in their chart and advise.

## 2024-08-04 ENCOUNTER — Other Ambulatory Visit (HOSPITAL_COMMUNITY): Payer: Self-pay

## 2024-08-04 NOTE — Telephone Encounter (Signed)
 Pharmacy Patient Advocate Encounter  Received notification from [facility] that Prior Authorization for Mounjaro has been APPROVED from 08/04/24 to 04/04/25   PA #/Case ID/Reference #: [id number]  Can you please send in a new order to the patient's pharmacy for Mounjaro?  Approval letter indexed to media tab.

## 2024-08-04 NOTE — Telephone Encounter (Signed)
 Pharmacy Patient Advocate Encounter  Received notification from [facility] that Prior Authorization for Zepbound  12.5mg /0.39ml has been DENIED.  See denial reason below. No denial letter attached in CMM. Will attach denial letter to Media tab once received.   PA #/Case ID/Reference #: [id number]  I have sent over an appeal stating the patient is able to take Mounjaro for weight loss.

## 2024-08-07 ENCOUNTER — Other Ambulatory Visit: Payer: Self-pay | Admitting: Family Medicine

## 2024-08-07 DIAGNOSIS — E662 Morbid (severe) obesity with alveolar hypoventilation: Secondary | ICD-10-CM

## 2024-08-07 DIAGNOSIS — G4733 Obstructive sleep apnea (adult) (pediatric): Secondary | ICD-10-CM

## 2024-08-07 DIAGNOSIS — I10 Essential (primary) hypertension: Secondary | ICD-10-CM

## 2024-08-07 MED ORDER — MOUNJARO 12.5 MG/0.5ML ~~LOC~~ SOAJ: 12.5000 mg | SUBCUTANEOUS | 1 refills | Status: AC

## 2024-08-07 MED ORDER — ZEPBOUND 12.5 MG/0.5ML ~~LOC~~ SOAJ: 12.5000 mg | SUBCUTANEOUS | 1 refills | Status: DC

## 2024-08-11 ENCOUNTER — Other Ambulatory Visit: Payer: Self-pay | Admitting: Family Medicine

## 2024-08-11 DIAGNOSIS — Z1231 Encounter for screening mammogram for malignant neoplasm of breast: Secondary | ICD-10-CM

## 2024-08-12 ENCOUNTER — Other Ambulatory Visit: Payer: Self-pay | Admitting: Family Medicine

## 2024-08-25 ENCOUNTER — Ambulatory Visit
Admission: RE | Admit: 2024-08-25 | Discharge: 2024-08-25 | Disposition: A | Discharge status: Home / Self Care | Source: Ambulatory Visit

## 2024-08-25 DIAGNOSIS — Z1231 Encounter for screening mammogram for malignant neoplasm of breast: Secondary | ICD-10-CM

## 2024-08-28 ENCOUNTER — Telehealth: Admitting: Family Medicine

## 2024-08-28 DIAGNOSIS — B9689 Other specified bacterial agents as the cause of diseases classified elsewhere: Secondary | ICD-10-CM

## 2024-08-28 DIAGNOSIS — J019 Acute sinusitis, unspecified: Secondary | ICD-10-CM | POA: Diagnosis not present

## 2024-08-28 MED ORDER — FLUTICASONE PROPIONATE 50 MCG/ACT NA SUSP: 2.0000 | Freq: Every day | NASAL | 0 refills | Status: AC

## 2024-08-28 MED ORDER — AMOXICILLIN-POT CLAVULANATE 875-125 MG PO TABS: 1.0000 | ORAL_TABLET | Freq: Two times a day (BID) | ORAL | 0 refills | Status: AC

## 2024-08-28 NOTE — Progress Notes (Signed)
 I do not recommend going around your daughter or grandbaby given you are having an elevated temp even if you start medications today. This is still a risk. You will need to not have a temp for 24 hours with out medication on board to help reduce it.      E-Visit for Sinus Problems  We are sorry that you are not feeling well.  Here is how we plan to help!  Based on what you have shared with me it looks like you have sinusitis.  Sinusitis is inflammation and infection in the sinus cavities of the head.  Based on your presentation I believe you most likely have Acute Bacterial Sinusitis.  This is an infection caused by bacteria and is treated with antibiotics. I have prescribed Augmentin  875mg /125mg  one tablet twice daily with food, for 7 days. and I have also prescribed Flonase  Nasal Spray Use 2 sprays in each nostril daily for 10-14 days You may use an oral decongestant such as Mucinex D or if you have glaucoma or high blood pressure use plain Mucinex. Saline nasal spray help and can safely be used as often as needed for congestion.  If you develop worsening sinus pain, fever or notice severe headache and vision changes, or if symptoms are not better after completion of antibiotic, please schedule an appointment with a health care provider.    Sinus infections are not as easily transmitted as other respiratory infection, however we still recommend that you avoid close contact with loved ones, especially the very young and elderly.  Remember to wash your hands thoroughly throughout the day as this is the number one way to prevent the spread of infection!  Home Care: Only take medications as instructed by your medical team. Complete the entire course of an antibiotic. Do not take these medications with alcohol. A steam or ultrasonic humidifier can help congestion.  You can place a towel over your head and breathe in the steam from hot water  coming from a faucet. Avoid close contacts especially the very  young and the elderly. Cover your mouth when you cough or sneeze. Always remember to wash your hands.  Get Help Right Away If: You develop worsening fever or sinus pain. You develop a severe head ache or visual changes. Your symptoms persist after you have completed your treatment plan.  Make sure you Understand these instructions. Will watch your condition. Will get help right away if you are not doing well or get worse.  Your e-visit answers were reviewed by a board certified advanced clinical practitioner to complete your personal care plan.  Depending on the condition, your plan could have included both over the counter or prescription medications.  If there is a problem please reply  once you have received a response from your provider.  Your safety is important to us .  If you have drug allergies check your prescription carefully.    You can use MyChart to ask questions about today's visit, request a non-urgent call back, or ask for a work or school excuse for 24 hours related to this e-Visit. If it has been greater than 24 hours you will need to follow up with your provider, or enter a new e-Visit to address those concerns.  You will get an e-mail in the next two days asking about your experience.  I hope that your e-visit has been valuable and will speed your recovery. Thank you for using e-visits.  I have spent 5 minutes in review of e-visit questionnaire,  review and updating patient chart, medical decision making and response to patient.   Chiquita CHRISTELLA Barefoot, NP

## 2024-09-04 ENCOUNTER — Encounter: Payer: Self-pay | Admitting: Radiology

## 2024-09-20 ENCOUNTER — Other Ambulatory Visit: Payer: Self-pay | Admitting: Family Medicine

## 2024-09-20 DIAGNOSIS — J4530 Mild persistent asthma, uncomplicated: Secondary | ICD-10-CM

## 2024-09-20 HISTORY — PX: EYE SURGERY: SHX253

## 2024-09-25 ENCOUNTER — Encounter: Payer: Self-pay | Admitting: Family Medicine

## 2024-09-26 MED ORDER — SCOPOLAMINE 1 MG/3DAYS TD PT72: 1.0000 | MEDICATED_PATCH | TRANSDERMAL | 0 refills | Status: AC

## 2024-09-26 MED ORDER — ONDANSETRON 8 MG PO TBDP: 8.0000 mg | ORAL_TABLET | Freq: Three times a day (TID) | ORAL | 0 refills | Status: AC | PRN

## 2024-10-02 ENCOUNTER — Ambulatory Visit
Admission: RE | Admit: 2024-10-02 | Discharge: 2024-10-02 | Disposition: A | Discharge status: Home / Self Care | Attending: Family Medicine | Admitting: Family Medicine

## 2024-10-02 VITALS — BP 113/70 | HR 82 | Temp 98.6°F | Resp 18 | Ht 66.0 in | Wt 178.0 lb

## 2024-10-02 DIAGNOSIS — H9201 Otalgia, right ear: Secondary | ICD-10-CM | POA: Diagnosis not present

## 2024-10-02 MED ORDER — NEOMYCIN-POLYMYXIN-HC 3.5-10000-1 OT SUSP: 4.0000 [drp] | Freq: Three times a day (TID) | OTIC | 0 refills | Status: AC

## 2024-10-02 NOTE — Discharge Instructions (Signed)
 Use eardrops 3 times a day until symptoms have resolved See your primary care doctor or audiologist if your symptoms persist

## 2024-10-02 NOTE — ED Triage Notes (Signed)
 Patient states that she wears hearing aids and on Friday a piece got stuck in her right ear.  She was able to get it out but now having a lot of pain in that ear.  Patient has taken Tylenol  for pain.

## 2024-10-02 NOTE — ED Provider Notes (Signed)
 [facility]    CSN: [id number] Arrival date & time: 10/02/24  0849      History   Chief Complaint Chief Complaint  Patient presents with   Ear Injury    Hearing aid piece hurt my ear. Pain, dizzy, loud noise - Entered by patient    HPI Christy Todd is a 63 y.o. female.   Patient is here for right ear pain.  Has been bothering her for the last couple of days.  She also has a loud noise in this ear.  She states it started after a piece of hearing aid got stuck in her ear and was painful.  Her husband was able to pull it out with tweezers.  She is worried she may have injured her eardrum    Past Medical History:  Diagnosis Date   Allergy 1976   Arthritis    Asthma    COVID-19 07/23/2021   Depression    pt states she does not have depression. Cymbalta  is used for pain control   Factor 5 Leiden mutation, heterozygous    GERD (gastroesophageal reflux disease)    Hypertension    Hypothyroid    Pre-diabetes    pt states she is no longer pre-diabetic due to weight loss   Seizures (HCC) 9:8:2019   Sleep apnea    on CPAP   Wears hearing aid in right ear     Patient Active Problem List   Diagnosis Date Noted   Dysfunction of left eustachian tube 04/10/2024   Change in hearing, left 04/10/2024   Allergic reaction 04/10/2024   Hives 04/10/2024   Arthralgia 09/20/2023   Word finding difficulty 08/06/2023   Hypomagnesemia 03/31/2023   Diverticulitis 03/19/2023   Constipation 03/19/2023   Abnormal weight gain 01/23/2023   Dyspnea 09/04/2022   Chest pain 07/06/2022   Status post total replacement of left hip 04/06/2022   Clavicle pain 02/13/2022   Primary osteoarthritis of left hip 11/21/2021   Orthostatic hypotension 09/22/2021   Dizziness 09/22/2021   Drug-induced weight loss 09/22/2021   Status post total replacement of right hip 08/25/2021   Sleep behavior disorder, 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   History of seizure 07/02/2020   Obesity with alveolar  hypoventilation and body mass index (BMI) of 40 or greater (HCC) 07/02/2020   Severe obstructive sleep apnea-hypopnea syndrome 07/02/2020   OSA on CPAP 07/02/2020   Essential hypertension 05/07/2020   Chronic ankle pain 05/07/2020   Degenerative joint disease (DJD) of hip 05/07/2020   Seizure disorder (HCC) 05/07/2020   Hypothyroidism 05/07/2020   Mild persistent asthma 05/07/2020    Past Surgical History:  Procedure Laterality Date   ABDOMINAL HYSTERECTOMY     ANKLE RECONSTRUCTION Left    CHOLECYSTECTOMY     COLONOSCOPY  12/09/2018   [facility].  3 mm sessile polyp, hepatic flexure. Mild - Moderate diverticulosis, sigmoid colon.   CYSTOCELE REPAIR     ESOPHAGOGASTRODUODENOSCOPY  12/09/2018   [facility]. Normal upper GI tract   EYE SURGERY Left 09/20/2024   Cataract Surgery   JOINT REPLACEMENT  08/2021,04/2022   RECTOCELE REPAIR     SALPINGECTOMY     SYNDACTLYLY REPAIR Right    SYNOVIAL CYST EXCISION Left    index finger   TENDON REPAIR     TOTAL ABDOMINAL HYSTERECTOMY     TOTAL HIP ARTHROPLASTY Right 08/25/2021   Procedure: RIGHT TOTAL HIP ARTHROPLASTY ANTERIOR APPROACH;  Surgeon: Jerri Kay HERO, MD;  Location:  [facility] OR;  Service: Orthopedics;  Laterality: Right;  3-C   TOTAL HIP ARTHROPLASTY Left 04/06/2022   Procedure: LEFT TOTAL HIP ARTHROPLASTY ANTERIOR APPROACH;  Surgeon: Jerri Kay HERO, MD;  Location: [facility] OR;  Service: Orthopedics;  Laterality: Left;    OB History   No obstetric history on file.      Home Medications    Prior to Admission medications   Medication Sig Start Date End Date Taking? Authorizing Provider  albuterol  (VENTOLIN  HFA) 108 (90 Base) MCG/ACT inhaler Inhale 2 puffs into the lungs every 6 (six) hours as needed for wheezing or shortness of breath. 07/23/21  Yes Gladis Elsie BROCKS, PA-C  ASPIRIN  81 PO Take 81 mg by mouth daily.   Yes [provider]  Cholecalciferol  (VITAMIN D3) 50 MCG (2000 UT) TABS Take 2,000 Units by mouth daily.   Yes [provider]  cyanocobalamin  (VITAMIN B12) 1000 MCG tablet Take 2,000 mcg by mouth daily.   Yes [provider]  DULoxetine  (CYMBALTA ) 30 MG capsule TAKE 2 BY MOUTH EVERY MORNING, 1 BY MOUTH EVERY AT NIGHT 07/19/24  Yes Matthews, Cody, DO  DUREZOL 0.05 % EMUL Place 1 drop into the right eye 4 (four) times daily. 09/21/24  Yes [provider]  fluticasone  (FLONASE ) 50 MCG/ACT nasal spray Place 2 sprays into both nostrils daily. 08/28/24  Yes Moishe Chiquita HERO, NP  gatifloxacin (ZYMAXID) 0.5 % SOLN 1 drop 4 (four) times daily. 09/05/24  Yes [provider]  levothyroxine  (SYNTHROID ) 137 MCG tablet TAKE 1 TABLET (137 MCG TOTAL) BY MOUTH AT BEDTIME. 08/14/24  Yes Alvia Bring, DO  lisinopril  (ZESTRIL ) 10 MG tablet TAKE 1 TABLET BY MOUTH EVERY DAY 03/28/24  Yes Alvia Bring, DO  Magnesium  200 MG TABS Take 200 mg by mouth daily.   Yes [provider]  methocarbamol  (ROBAXIN ) 500 MG tablet Take 1 tablet (500 mg total) by mouth every 8 (eight) hours as needed. 05/06/24  Yes Patsey Lot, MD  montelukast  (SINGULAIR ) 10 MG tablet TAKE 1 TABLET BY MOUTH EVERYDAY AT BEDTIME 09/20/24  Yes Alvia Bring, DO  Moringa Oleifera (MORINGA PO) Take by mouth.   Yes [provider]  neomycin-polymyxin-hydrocortisone (CORTISPORIN) 3.5-10000-1 OTIC suspension Place 4 drops into the right ear 3 (three) times daily. 10/02/24  Yes Maranda Jamee Jacob, MD  niacin 50 MG tablet Take 50 mg by mouth at bedtime.   Yes [provider]  NON FORMULARY 1 each by Other route at bedtime. cpap   Yes [provider]  omeprazole (PRILOSEC) 20 MG capsule Take 20 mg by mouth at bedtime.   Yes [provider]  ondansetron  (ZOFRAN -ODT) 8 MG disintegrating tablet Take 1 tablet (8 mg total) by mouth every 8 (eight) hours as needed for nausea or vomiting. 09/26/24  Yes Matthews, Cody, DO   PROLENSA 0.07 % SOLN Place 1 drop into the right eye 2 (two) times daily. 09/21/24  Yes [provider]  pyridOXINE (VITAMIN B6) 100 MG tablet Take 100 mg by mouth daily.   Yes [provider]  scopolamine  (TRANSDERM-SCOP) 1 MG/3DAYS Place 1 patch (1 mg total) onto the skin every 3 (three) days. 09/26/24  Yes Alvia Bring, DO  spironolactone  (ALDACTONE ) 50 MG tablet TAKE 1 TABLET BY MOUTH EVERY DAY 06/19/24  Yes Alvia Bring, DO  tirzepatide  (MOUNJARO ) 12.5 MG/0.5ML Pen Inject 12.5 mg into the skin once a week. 08/07/24  Yes Alvia Bring, DO  TURMERIC CURCUMIN PO Take by mouth.   Yes [provider]  oxyCODONE -acetaminophen  (PERCOCET/ROXICET) 5-325 MG tablet Take 1-2 tablets by mouth every 8 (eight) hours as needed for severe pain (pain score 7-10). Patient not taking: Reported on 05/12/2024 05/06/24   Patsey Lot, MD    Family History Family History  Problem Relation Age of Onset   Hashimoto's thyroiditis Mother    Stroke Mother    Heart attack Mother    Obesity Mother    Hypertension Father    Pancreatic cancer Father    Cancer Father    Retinal detachment Brother    Lupus Maternal Aunt    Cancer Maternal Aunt    Diabetes Maternal Grandmother    Lupus Cousin    Alcohol abuse Maternal Grandfather    Colon cancer Neg Hx    Stomach cancer Neg Hx    Esophageal cancer Neg Hx     Social History Social History   Tobacco Use   Smoking status: Never    Passive exposure: Past   Smokeless tobacco: Never  Vaping Use   Vaping status: Never Used  Substance Use Topics   Alcohol use: Not Currently   Drug use: Never     Allergies   Ciprofloxacin, Apple juice, Gluten meal, Benzoin, Claritin [loratadine], and Septra [sulfamethoxazole-trimethoprim]   Review of Systems Review of Systems See HPI  Physical Exam Triage Vital Signs ED Triage Vitals  Encounter Vitals Group     BP 10/02/24 0859 113/70     Girls Systolic BP Percentile --       Girls Diastolic BP Percentile --      Boys Systolic BP Percentile --      Boys Diastolic BP Percentile --      Pulse Rate 10/02/24 0859 82     Resp 10/02/24 0859 18     Temp 10/02/24 0859 98.6 F (37 C)     Temp Source 10/02/24 0859 Oral     SpO2 10/02/24 0859 96 %     Weight 10/02/24 0856 178 lb (80.7 kg)     Height 10/02/24 0856 5' 6 (1.676 m)     Head Circumference --      Peak Flow --      Pain Score 10/02/24 0856 1     Pain Loc --      Pain Education --      Exclude from Growth Chart --    No data found.  Updated Vital Signs BP 113/70 (BP Location: Right Arm)   Pulse 82   Temp 98.6 F (37 C) (Oral)   Resp 18   Ht 5' 6 (1.676 m)   Wt 80.7 kg   LMP 11/02/1993   SpO2 96%   BMI 28.73 kg/m      Physical Exam Constitutional:      General: She is not in acute distress.    Appearance: She is well-developed. She is not ill-appearing.  HENT:     Head: Normocephalic and atraumatic.     Left Ear: Tympanic membrane and ear canal normal.     Ears:     Comments: Right TM has slight injection of the inferior border.  Mild injection of the canal.  No perforation of eardrum.  Normal light reflex    Nose: No congestion.     Mouth/Throat:     Mouth: Mucous membranes are moist.     Pharynx: No posterior oropharyngeal erythema.  Eyes:     Conjunctiva/sclera: Conjunctivae normal.     Pupils: Pupils are equal, round, and reactive to light.  Cardiovascular:  Rate and Rhythm: Normal rate.  Pulmonary:     Effort: Pulmonary effort is normal. No respiratory distress.  Abdominal:     General: There is no distension.     Palpations: Abdomen is soft.  Musculoskeletal:        General: Normal range of motion.     Cervical back: Normal range of motion.  Skin:    General: Skin is warm and dry.  Neurological:     Mental Status: She is alert.      UC Treatments / Results  Labs (all labs ordered are listed, but only abnormal results are displayed) Labs Reviewed - No data to  display  EKG   Radiology No results found.  Procedures Procedures (including critical care time)  Medications Ordered in UC Medications - No data to display  Initial Impression / Assessment and Plan / UC Course  I have reviewed the triage vital signs and the nursing notes.  Pertinent labs & imaging results that were available during my care of the patient were reviewed by me and considered in my medical decision making (see chart for details).     Patient is on Allegra and Flonase  for her allergies.  She is told to continue these medications.  We did give her an eardrop to see if it helps with her discomfort. Final Clinical Impressions(s) / UC Diagnoses   Final diagnoses:  Otalgia of right ear     Discharge Instructions      Use eardrops 3 times a day until symptoms have resolved See your primary care doctor or audiologist if your symptoms persist   ED Prescriptions     Medication Sig Dispense Auth. Provider   neomycin-polymyxin-hydrocortisone (CORTISPORIN) 3.5-10000-1 OTIC suspension Place 4 drops into the right ear 3 (three) times daily. 10 mL Maranda Jamee Jacob, MD      PDMP not reviewed this encounter.   Maranda Jamee Jacob, MD 10/02/24 1001

## 2024-10-03 ENCOUNTER — Ambulatory Visit: Admitting: Family Medicine

## 2024-10-08 ENCOUNTER — Other Ambulatory Visit: Payer: Self-pay | Admitting: Family Medicine

## 2024-10-08 DIAGNOSIS — I10 Essential (primary) hypertension: Secondary | ICD-10-CM

## 2024-10-09 NOTE — Telephone Encounter (Signed)
 Pls contact pt to schedule 64-month follow-up with Dr. Alvia. Thx

## 2024-11-18 ENCOUNTER — Other Ambulatory Visit: Payer: Self-pay | Admitting: Family Medicine

## 2024-12-05 ENCOUNTER — Telehealth: Payer: Self-pay | Admitting: Neurology

## 2024-12-06 NOTE — Telephone Encounter (Signed)
 The pt called and scheduled VV with phone room staff.

## 2024-12-06 NOTE — Telephone Encounter (Signed)
" ° °  Please obtain a download of current CPAP data, we should set the travel CPAP to the 95% of pressure on auto-cpap. I would like the patient to be seen , can be virtually with Megan , in order to document that we considered current data.   Dedra Gores, MD  "

## 2025-01-16 ENCOUNTER — Telehealth: Admitting: Adult Health

## 2025-07-13 ENCOUNTER — Ambulatory Visit: Admitting: Rheumatology
